# Patient Record
Sex: Female | Born: 1955
Health system: Southern US, Community
[De-identification: ages and names within clinical notes are randomized; demographics above are authoritative.]

## PROBLEM LIST (undated history)

## (undated) DIAGNOSIS — M159 Polyosteoarthritis, unspecified: Secondary | ICD-10-CM

## (undated) DIAGNOSIS — M19019 Primary osteoarthritis, unspecified shoulder: Secondary | ICD-10-CM

## (undated) DIAGNOSIS — E119 Type 2 diabetes mellitus without complications: Secondary | ICD-10-CM

## (undated) DIAGNOSIS — S92911A Unspecified fracture of right toe(s), initial encounter for closed fracture: Secondary | ICD-10-CM

## (undated) DIAGNOSIS — I10 Essential (primary) hypertension: Secondary | ICD-10-CM

## (undated) DIAGNOSIS — G894 Chronic pain syndrome: Secondary | ICD-10-CM

## (undated) DIAGNOSIS — Z8781 Personal history of (healed) traumatic fracture: Secondary | ICD-10-CM

## (undated) DIAGNOSIS — T4145XA Adverse effect of unspecified anesthetic, initial encounter: Secondary | ICD-10-CM

## (undated) DIAGNOSIS — E890 Postprocedural hypothyroidism: Secondary | ICD-10-CM

## (undated) DIAGNOSIS — I82439 Acute embolism and thrombosis of unspecified popliteal vein: Secondary | ICD-10-CM

## (undated) DIAGNOSIS — W19XXXA Unspecified fall, initial encounter: Secondary | ICD-10-CM

## (undated) DIAGNOSIS — C4442 Squamous cell carcinoma of skin of scalp and neck: Secondary | ICD-10-CM

## (undated) DIAGNOSIS — E038 Other specified hypothyroidism: Secondary | ICD-10-CM

## (undated) DIAGNOSIS — R42 Dizziness and giddiness: Secondary | ICD-10-CM

## (undated) HISTORY — DX: Chronic pain syndrome: G89.4

## (undated) HISTORY — DX: Type 2 diabetes mellitus without complications: E11.9

## (undated) HISTORY — DX: Squamous cell carcinoma of skin of scalp and neck: C44.42

## (undated) HISTORY — DX: Polyosteoarthritis, unspecified: M15.9

## (undated) HISTORY — DX: Dizziness and giddiness: R42

## (undated) HISTORY — DX: Unspecified fracture of right toe(s), initial encounter for closed fracture: S92.911A

## (undated) HISTORY — DX: Postprocedural hypothyroidism: E89.0

## (undated) HISTORY — PX: BACK SURGERY: SHX140

## (undated) HISTORY — DX: Primary osteoarthritis, unspecified shoulder: M19.019

## (undated) HISTORY — DX: Other specified hypothyroidism: E03.8

## (undated) HISTORY — PX: KNEE SURGERY: SHX244

---
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
## 1988-08-12 HISTORY — PX: CHOLECYSTECTOMY: SHX55

## 1988-08-12 HISTORY — PX: TOTAL ABDOMINAL HYSTERECTOMY W/ BILATERAL SALPINGOOPHORECTOMY: SHX83

## 2000-02-07 ENCOUNTER — Encounter: Admission: RE | Admit: 2000-02-07 | Discharge: 2000-02-07 | Payer: Self-pay | Admitting: Family Medicine

## 2000-02-07 ENCOUNTER — Encounter: Payer: Self-pay | Admitting: Family Medicine

## 2000-05-15 ENCOUNTER — Ambulatory Visit (HOSPITAL_COMMUNITY): Admission: RE | Admit: 2000-05-15 | Discharge: 2000-05-15 | Payer: Self-pay | Admitting: Gastroenterology

## 2000-06-18 ENCOUNTER — Ambulatory Visit (HOSPITAL_COMMUNITY): Admission: RE | Admit: 2000-06-18 | Discharge: 2000-06-18 | Payer: Self-pay | Admitting: Gastroenterology

## 2000-08-19 ENCOUNTER — Encounter (HOSPITAL_COMMUNITY): Admission: RE | Admit: 2000-08-19 | Discharge: 2000-11-17 | Payer: Self-pay | Admitting: Gastroenterology

## 2000-08-23 ENCOUNTER — Inpatient Hospital Stay (HOSPITAL_COMMUNITY): Admission: AD | Admit: 2000-08-23 | Discharge: 2000-08-23 | Payer: Self-pay | Admitting: Obstetrics

## 2000-08-25 ENCOUNTER — Encounter (INDEPENDENT_AMBULATORY_CARE_PROVIDER_SITE_OTHER): Payer: Self-pay | Admitting: *Deleted

## 2000-08-25 ENCOUNTER — Inpatient Hospital Stay (HOSPITAL_COMMUNITY): Admission: AD | Admit: 2000-08-25 | Discharge: 2000-08-25 | Payer: Self-pay | Admitting: Obstetrics and Gynecology

## 2000-08-25 ENCOUNTER — Ambulatory Visit (HOSPITAL_COMMUNITY): Admission: RE | Admit: 2000-08-25 | Discharge: 2000-08-25 | Payer: Self-pay | Admitting: Gastroenterology

## 2000-08-27 ENCOUNTER — Ambulatory Visit (HOSPITAL_COMMUNITY): Admission: RE | Admit: 2000-08-27 | Discharge: 2000-08-27 | Payer: Self-pay | Admitting: Obstetrics and Gynecology

## 2000-08-27 ENCOUNTER — Encounter (INDEPENDENT_AMBULATORY_CARE_PROVIDER_SITE_OTHER): Payer: Self-pay

## 2000-08-27 ENCOUNTER — Other Ambulatory Visit: Admission: RE | Admit: 2000-08-27 | Discharge: 2000-08-27 | Payer: Self-pay | Admitting: Obstetrics and Gynecology

## 2000-08-27 ENCOUNTER — Encounter: Payer: Self-pay | Admitting: Obstetrics and Gynecology

## 2000-09-11 ENCOUNTER — Inpatient Hospital Stay (HOSPITAL_COMMUNITY): Admission: RE | Admit: 2000-09-11 | Discharge: 2000-09-15 | Payer: Self-pay | Admitting: Obstetrics and Gynecology

## 2000-09-11 ENCOUNTER — Encounter (INDEPENDENT_AMBULATORY_CARE_PROVIDER_SITE_OTHER): Payer: Self-pay | Admitting: Specialist

## 2002-03-03 ENCOUNTER — Encounter: Payer: Self-pay | Admitting: Family Medicine

## 2002-03-03 ENCOUNTER — Encounter: Admission: RE | Admit: 2002-03-03 | Discharge: 2002-03-03 | Payer: Self-pay | Admitting: Family Medicine

## 2002-03-11 ENCOUNTER — Encounter: Payer: Self-pay | Admitting: Surgery

## 2002-03-18 ENCOUNTER — Encounter (INDEPENDENT_AMBULATORY_CARE_PROVIDER_SITE_OTHER): Payer: Self-pay | Admitting: Specialist

## 2002-03-18 ENCOUNTER — Encounter: Payer: Self-pay | Admitting: Surgery

## 2002-03-19 ENCOUNTER — Inpatient Hospital Stay (HOSPITAL_COMMUNITY): Admission: RE | Admit: 2002-03-19 | Discharge: 2002-03-20 | Payer: Self-pay | Admitting: Surgery

## 2002-11-19 ENCOUNTER — Encounter: Payer: Self-pay | Admitting: Family Medicine

## 2002-11-19 ENCOUNTER — Ambulatory Visit (HOSPITAL_COMMUNITY): Admission: RE | Admit: 2002-11-19 | Discharge: 2002-11-19 | Payer: Self-pay | Admitting: Family Medicine

## 2003-05-31 ENCOUNTER — Encounter: Payer: Self-pay | Admitting: Obstetrics and Gynecology

## 2003-05-31 ENCOUNTER — Ambulatory Visit (HOSPITAL_COMMUNITY): Admission: RE | Admit: 2003-05-31 | Discharge: 2003-05-31 | Payer: Self-pay | Admitting: Obstetrics and Gynecology

## 2004-05-01 ENCOUNTER — Encounter
Admission: RE | Admit: 2004-05-01 | Discharge: 2004-05-01 | Payer: Self-pay | Admitting: Physical Medicine and Rehabilitation

## 2004-08-04 ENCOUNTER — Emergency Department (HOSPITAL_COMMUNITY): Admission: EM | Admit: 2004-08-04 | Discharge: 2004-08-04 | Payer: Self-pay | Admitting: Family Medicine

## 2004-10-07 ENCOUNTER — Emergency Department (HOSPITAL_COMMUNITY): Admission: EM | Admit: 2004-10-07 | Discharge: 2004-10-07 | Payer: Self-pay | Admitting: Family Medicine

## 2004-11-05 ENCOUNTER — Emergency Department (HOSPITAL_COMMUNITY): Admission: EM | Admit: 2004-11-05 | Discharge: 2004-11-05 | Payer: Self-pay | Admitting: Family Medicine

## 2005-02-04 ENCOUNTER — Emergency Department (HOSPITAL_COMMUNITY): Admission: EM | Admit: 2005-02-04 | Discharge: 2005-02-04 | Payer: Self-pay | Admitting: Emergency Medicine

## 2005-07-23 ENCOUNTER — Emergency Department (HOSPITAL_COMMUNITY): Admission: AD | Admit: 2005-07-23 | Discharge: 2005-07-23 | Payer: Self-pay | Admitting: Emergency Medicine

## 2005-12-01 ENCOUNTER — Encounter: Admission: RE | Admit: 2005-12-01 | Discharge: 2005-12-01 | Payer: Self-pay | Admitting: Family Medicine

## 2005-12-26 ENCOUNTER — Encounter (INDEPENDENT_AMBULATORY_CARE_PROVIDER_SITE_OTHER): Payer: Self-pay | Admitting: *Deleted

## 2005-12-26 ENCOUNTER — Ambulatory Visit (HOSPITAL_COMMUNITY): Admission: RE | Admit: 2005-12-26 | Discharge: 2005-12-26 | Payer: Self-pay | Admitting: Neurosurgery

## 2008-03-18 ENCOUNTER — Emergency Department (HOSPITAL_BASED_OUTPATIENT_CLINIC_OR_DEPARTMENT_OTHER): Admission: EM | Admit: 2008-03-18 | Discharge: 2008-03-18 | Payer: Self-pay | Admitting: Emergency Medicine

## 2008-06-21 ENCOUNTER — Emergency Department (HOSPITAL_BASED_OUTPATIENT_CLINIC_OR_DEPARTMENT_OTHER): Admission: EM | Admit: 2008-06-21 | Discharge: 2008-06-21 | Payer: Self-pay | Admitting: Emergency Medicine

## 2008-08-12 HISTORY — PX: COLONOSCOPY: SHX174

## 2009-01-18 ENCOUNTER — Ambulatory Visit: Payer: Self-pay | Admitting: Diagnostic Radiology

## 2009-01-18 ENCOUNTER — Emergency Department (HOSPITAL_BASED_OUTPATIENT_CLINIC_OR_DEPARTMENT_OTHER): Admission: EM | Admit: 2009-01-18 | Discharge: 2009-01-18 | Payer: Self-pay | Admitting: Emergency Medicine

## 2009-04-21 ENCOUNTER — Emergency Department (HOSPITAL_BASED_OUTPATIENT_CLINIC_OR_DEPARTMENT_OTHER): Admission: EM | Admit: 2009-04-21 | Discharge: 2009-04-21 | Payer: Self-pay | Admitting: Emergency Medicine

## 2009-08-10 ENCOUNTER — Ambulatory Visit: Payer: Self-pay | Admitting: Diagnostic Radiology

## 2009-08-10 ENCOUNTER — Emergency Department (HOSPITAL_BASED_OUTPATIENT_CLINIC_OR_DEPARTMENT_OTHER): Admission: EM | Admit: 2009-08-10 | Discharge: 2009-08-10 | Payer: Self-pay | Admitting: Emergency Medicine

## 2010-04-23 ENCOUNTER — Emergency Department (HOSPITAL_BASED_OUTPATIENT_CLINIC_OR_DEPARTMENT_OTHER): Admission: EM | Admit: 2010-04-23 | Discharge: 2010-04-23 | Payer: Self-pay | Admitting: Emergency Medicine

## 2010-04-23 ENCOUNTER — Ambulatory Visit: Payer: Self-pay | Admitting: Diagnostic Radiology

## 2010-05-23 ENCOUNTER — Emergency Department (HOSPITAL_BASED_OUTPATIENT_CLINIC_OR_DEPARTMENT_OTHER): Admission: EM | Admit: 2010-05-23 | Discharge: 2010-05-23 | Payer: Self-pay | Admitting: Emergency Medicine

## 2010-05-23 ENCOUNTER — Ambulatory Visit: Payer: Self-pay | Admitting: Diagnostic Radiology

## 2010-09-02 ENCOUNTER — Encounter: Payer: Self-pay | Admitting: Family Medicine

## 2010-10-25 LAB — DIFFERENTIAL
Basophils Absolute: 0.1 10*3/uL (ref 0.0–0.1)
Basophils Relative: 1 % (ref 0–1)
Eosinophils Absolute: 0.1 10*3/uL (ref 0.0–0.7)
Eosinophils Relative: 2 % (ref 0–5)
Lymphocytes Relative: 23 % (ref 12–46)
Lymphs Abs: 2.1 10*3/uL (ref 0.7–4.0)
Monocytes Absolute: 0.7 10*3/uL (ref 0.1–1.0)
Monocytes Relative: 8 % (ref 3–12)
Neutro Abs: 6.1 10*3/uL (ref 1.7–7.7)
Neutrophils Relative %: 66 % (ref 43–77)

## 2010-10-25 LAB — BASIC METABOLIC PANEL
BUN: 8 mg/dL (ref 6–23)
CO2: 29 mEq/L (ref 19–32)
Calcium: 9.6 mg/dL (ref 8.4–10.5)
Chloride: 105 mEq/L (ref 96–112)
Creatinine, Ser: 0.8 mg/dL (ref 0.4–1.2)
GFR calc Af Amer: >60 mL/min (ref >60)
GFR calc non Af Amer: >60 mL/min (ref >60)
Glucose, Bld: 138 mg/dL — ABNORMAL HIGH (ref 70–99)
Potassium: 3.3 mEq/L — ABNORMAL LOW (ref 3.5–5.1)
Sodium: 141 mEq/L (ref 135–145)

## 2010-10-25 LAB — CBC
HCT: 40.5 % (ref 36.0–46.0)
Hemoglobin: 14.4 g/dL (ref 12.0–15.0)
MCH: 31.1 pg (ref 26.0–34.0)
MCHC: 35.5 g/dL (ref 30.0–36.0)
MCV: 87.5 fL (ref 78.0–100.0)
Platelets: 260 10*3/uL (ref 150–400)
RBC: 4.63 MIL/uL (ref 3.87–5.11)
RDW: 12.7 % (ref 11.5–15.5)
WBC: 9.1 10*3/uL (ref 4.0–10.5)

## 2010-10-25 LAB — POCT CARDIAC MARKERS
CKMB, poc: 1.4 ng/mL (ref 1.0–8.0)
Myoglobin, poc: 71.1 ng/mL (ref 12–200)
Troponin i, poc: <0.05 ng/mL (ref 0.00–0.09)

## 2010-10-25 LAB — D-DIMER, QUANTITATIVE: D-Dimer, Quant: 0.25 ug/mL-FEU (ref 0.00–0.48)

## 2010-11-12 LAB — DIFFERENTIAL
Basophils Absolute: 0.2 10*3/uL — ABNORMAL HIGH (ref 0.0–0.1)
Basophils Relative: 2 % — ABNORMAL HIGH (ref 0–1)
Eosinophils Absolute: 0.2 10*3/uL (ref 0.0–0.7)
Eosinophils Relative: 3 % (ref 0–5)
Lymphocytes Relative: 29 % (ref 12–46)
Lymphs Abs: 2.1 10*3/uL (ref 0.7–4.0)
Monocytes Absolute: 0.6 10*3/uL (ref 0.1–1.0)
Monocytes Relative: 9 % (ref 3–12)
Neutro Abs: 4.2 10*3/uL (ref 1.7–7.7)
Neutrophils Relative %: 58 % (ref 43–77)

## 2010-11-12 LAB — CBC
HCT: 43.3 % (ref 36.0–46.0)
Hemoglobin: 14.8 g/dL (ref 12.0–15.0)
MCHC: 34.2 g/dL (ref 30.0–36.0)
MCV: 90.8 fL (ref 78.0–100.0)
Platelets: 277 10*3/uL (ref 150–400)
RBC: 4.77 MIL/uL (ref 3.87–5.11)
RDW: 12.4 % (ref 11.5–15.5)
WBC: 7.3 10*3/uL (ref 4.0–10.5)

## 2010-11-12 LAB — POCT CARDIAC MARKERS
CKMB, poc: 1.7 ng/mL (ref 1.0–8.0)
CKMB, poc: 6.4 ng/mL (ref 1.0–8.0)
Myoglobin, poc: 75.4 ng/mL (ref 12–200)
Myoglobin, poc: 96.1 ng/mL (ref 12–200)
Troponin i, poc: <0.05 ng/mL (ref 0.00–0.09)
Troponin i, poc: <0.05 ng/mL (ref 0.00–0.09)

## 2010-11-12 LAB — BASIC METABOLIC PANEL
BUN: 7 mg/dL (ref 6–23)
CO2: 28 mEq/L (ref 19–32)
Calcium: 9.3 mg/dL (ref 8.4–10.5)
Chloride: 104 mEq/L (ref 96–112)
Creatinine, Ser: 0.8 mg/dL (ref 0.4–1.2)
GFR calc Af Amer: >60 mL/min (ref >60)
GFR calc non Af Amer: >60 mL/min (ref >60)
Glucose, Bld: 149 mg/dL — ABNORMAL HIGH (ref 70–99)
Potassium: 3.5 mEq/L (ref 3.5–5.1)
Sodium: 144 mEq/L (ref 135–145)

## 2010-11-12 LAB — D-DIMER, QUANTITATIVE: D-Dimer, Quant: <0.22 ug/mL-FEU (ref 0.00–0.48)

## 2010-12-28 NOTE — Procedures (Signed)
[facility]  Patient:    Claudia [name], Claudia [name]                       MRN: [id number] Proc. Date: 08/25/00 Adm. Date:  [id number] Attending:  Charna Elizabeth CC:         Teena Irani. Arlyce Dice, M.D., [facility]   Procedure Report  DATE OF BIRTH:  1956/02/27  PROCEDURE PERFORMED:  Colonoscopy.  ENDOSCOPIST:  Anselmo Rod, M.D.  INSTRUMENTS USED:  Olympus video colonoscope.  INDICATIONS:  Severe anemia with hemoglobin of 6.4 g/dl with microcytosis evident on CBC and guaiac positive stools in a 55 year old white female.  The patient tells me this weekend she has had heavy vaginal bleeding as well with passage of clots.  Rule out colonic polyps, masses, hemorrhoids, etc.  PREPROCEDURE PREPARATION:  Informed consent was procured from the patient. The patient was fasted for eight hours prior to the procedure.  She also received two units of packed red blood cells prior to the procedure.  PREPROCEDURE PHYSICAL EXAMINATION:  VITAL SIGNS:  The patient had stable vital signs.  NECK:  Supple.  CHEST:  Clear to auscultation, S1, S2 regular.  ABDOMEN:  Soft with normal abdominal bowel sounds.  DESCRIPTION OF PROCEDURE:  The patient was placed in the left lateral decubitus position and sedated with 50 mg of Demerol and 5 mg of Versed intravenously.  Once the patient was adequately sedated and maintained on low-flow oxygen and continuous cardiac monitoring, the Olympus video colonoscope was advanced from the rectum to the cecum without difficulty. There was some residual stool in the colon, especially the transverse and right colon.  There were scattered diverticula throughout the colon, more in the proximal descending and transverse colon.  No masses, polyps, erosions, or ulcerations were seen.  The patient had small, nonbleeding internal hemorrhoids on retroflexion in the rectum and tolerated the procedure well without  complications.  IMPRESSION: 1. Scattered diverticular disease. 2. Small, nonbleeding internal hemorrhoids. 3. No masses or polyps seen. 4. Procedure completed to the cecum.  The ileocecal valve and appendiceal    orifice clearly visualized and photographed.  RECOMMENDATIONS: 1. Increase fluids and fiber in her diet. 2. Check CBC today. 3. Blood transfusions as needed. 4. Follow up with Dr. Ambrose Mantle (gynecologist) later this week for further    evaluation of her vaginal bleeding. DD:  08/25/00 TD:  08/25/00 Job: [id number]

## 2010-12-28 NOTE — H&P (Signed)
[facility]  Patient:    Claudia [name], Claudia [name]                       MRN: [id number] Adm. Date:  [id number] Attending:  Charna Elizabeth                         History and Physical  HISTORY OF PRESENT ILLNESS:  This is a 55 year old, white, divorced female, para 2-0-1-2, who is admitted to the hospital for abdominal hysterectomy and bilateral salpingo-oophorectomy because of severe menorrhagia leading to severe anemia and leiomyoma to uteri.  Last menstrual period started on January 1 and continued until January 15 unabated.  When I saw her on 04-Feb-2024she had passed large golf ball size clots, had used 12 pads per day and had received four units of blood after her hemoglobin was found to be 6 by Dr. Elsie Amis, a gastrointestinal specialist.  After I saw her, I performed a Pap smear which was benign and an endometrial biopsy was benign.  A ultrasound showed a 5.8 x 5.7 cm area in the central uterus that could be a fibroid and a 5.6 x 2.9 x 3 cm left adnexal cyst.  The patient gave a history that her periods had been regular, lasting seven days every 28 days until the period began in January of 2002.  Over the previous 12 months, she had lost 69 pounds from depression.  She had also had an evaluation because of dysphagia, that included an upper endoscopy which was negative, except for what appeared to be fungal esophagitis.  HIV was negative.  The patient was treated with Provera and her bleeding has pretty much ceased.  ALLERGIES:  No known drug allergies.  MEDICATIONS:  No medications at present.  PAST SURGICAL HISTORY:  Tubal ligation in 1986, back surgery in 2000.  She had a TENS unit implanted in her side for pain in 1990.  PAST MEDICAL HISTORY:  Illnesses:  She has had the usual childhood diseases as stated in the history of present illness.  She has been evaluated for dysphagia with a stricture noted on a barium swallow.  The esophagus was dilated.  At  the time of wisdom teeth extraction in 1990, the patient apparently developed a hematoma in her left arm that she states caused the left arm to swell up as big as a watermelon.  She also developed a clot in the venous system in her left arm and was kept on Coumadin for three to six months.  FAMILY HISTORY:  Mother and father have diabetes.  Children are 21 and 17. Her husband left her in January of 2001.  Alcohol, tobacco and drugs, none. The patient works for [employer].  PHYSICAL EXAMINATION:  GENERAL:  Physical examination reveals a well-developed, somewhat obese, white female in no acute distress.  VITAL SIGNS:  Blood pressure is 120/70, pulse is 80, weight is 201-1/2 pounds but the patient did weigh 270 a year ago.  HEENT:  Revealed that the patient is somewhat alopecic.  She does have an upper dental plate and many absent lower teeth from the bicuspids out to the molars.  She does have a partial plate but does not have it in now.  NECK:  Supple without thyromegaly.  BREASTS:  Soft without masses lying down or sitting up.  HEART:  Normal size and sounds.  No murmurs.  LUNGS:  Clear to P&A.  ABDOMEN:  Soft and nontender.  There is no masses palpable.  The subcutaneous tissue is quite loose.  No masses are felt.  Liver, spleen and kidney are not felt.  PELVIC:  Vulva and vagina are clear.  BUS negative.  Cervix clean.  Uterus anterior, two to three times normal size, quite firm.  The adnexa are clear. The patient has had a colonoscopy recently.  ADMITTING IMPRESSION:  Leiomyoma uteri, history of severe menometrorrhagia with severe anemia to 6 grams.  The patient was given four units of blood products by Dr. Elsie Amis, a gastrointestinal specialist.  PLAN:  She is admitted now for abdominal hysterectomy.  She wants the tubes and ovaries removed.  The patient has been informed that I think that she is at increased risk for a wound infection because of her very  loose skin.  I have also told her about the risks of heart attack, stroke, pulmonary embolus, wound disruption, hemorrhage with a need for reoperation and/or transfusion, fistula formation, nerve injury, intestinal obstruction.  She understands and agrees to proceed.  The patient is also counseled about the unknown effect on her sex drive. DD:  09/10/00 TD:  09/11/00 Job: [id number]

## 2010-12-28 NOTE — Discharge Summary (Signed)
[facility]  Patient:    Claudia [name], Claudia [name]                       MRN: [id number] Adm. Date:  [id number] Disc. Date: [id number] Attending:  Malon Kindle                           Discharge Summary  ADDENDUM:  The patient was originally discharged on September 14, 2000, but because of nausea and vomiting, she elected to stay an extra day.  The nausea and vomiting has completely resolved and she is considered a candidate for discharge on September 15, 2000.  The abdomen is soft and nontender.  The staples have been removed and strips applied on the morning of discharge.  FINAL DIAGNOSES: 1. Large leiomyomata uteri. 2. History of severe menorrhagia with severe anemia creating a need for four    blood transfusions by her gastroenterologist.  FINAL CONDITION:  Improved.  DISCHARGE INSTRUCTIONS:  No vaginal entrance.  No heavy lifting or strenuous activity.  Report any fever greater than 100.4 degrees.  Report any unusual problems.  The patient was given a prescription of Dilaudid by Alvino Chapel, M.D.  That was not available at the pharmacy on September 14, 2000. If it is not available today, the patient can call my office for Vicodin, which is she also tolerating.  The patient is to return in 10-14 days. DD:  09/15/00 TD:  09/16/00 Job: [id number]

## 2010-12-28 NOTE — Op Note (Signed)
[facility]  Patient:    Claudia [name], Claudia [name]                       MRN: [id number] Proc. Date: 09/11/00 Adm. Date:  [id number] Attending:  Malon Kindle                           Operative Report  PREOPERATIVE DIAGNOSIS:  Fibroids, left ovarian cyst, menorrhagia, anemia.  POSTOPERATIVE DIAGNOSIS:  Fibroids, left ovarian cyst, menorrhagia, anemia.  OPERATION PERFORMED:  Abdominal hysterectomy, bilateral salpingo-oophorectomy.  SURGEON:  Malachi Pro. Ambrose Mantle, M.D.  ASSISTANT:  Alvino Chapel, M.D.  ANESTHESIA:  General.  DESCRIPTION OF PROCEDURE:  The patient was brought to the operating room and placed under satisfactory general anesthesia.  She was placed in frog leg position.  The abdomen was prepped with Betadine solution.  The vulva, vagina and urethra were prepped.  The Foley catheter was inserted to straight drain. The patient was then placed supine and the abdomen was draped as a sterile field.  I elected not to use electrical current because the patient had an implantable TENS unit in the left chest wall and I was not certain this could not conduct electricity.  The patients abdominal wall was very loose, probably secondary to her prior severe obesity and a 69-1/2 pound weight loss in the last 12 months.  A transverse incision was made and carried in layers through the skin and subcutaneous tissue and fascia.  The fascia was then separated from the rectus muscles superiorly and inferiorly.  The rectus muscles were split in the midline.  The peritoneum was opened vertically. Subcutaneous bleeders were ligated with 2-0 Vicryl.  The upper abdomen was explored.  It was a long ways up to her gallbladder but I thought I did feel the gallbladder without stones.  The liver felt smooth.  Both kidneys felt normal.  I did not palpate any abdominal abnormalities.  The omentum felt smooth.  Packs and retractors were used to prepare the operative  field.  There was a dense omental adhesion to the anterior peritoneum.  This was divided between clamps and ligated with 2-0 Vicryl.  This was probably secondary to her prior minilap tubal ligation.  Exploration of the pelvis revealed the posterior cul-de-sac to be normal.  The uterus was probably three times normal size with about a 5 cm pedunculated myoma from the right side of the uterus. The uterus itself was approximately three times normal size but was not irregular in contour but probably did contain intramural leiomyomas.  The anterior cul-de-sac was relatively normal.  There may have been a slight amount of scar tissue over the bladder.  The right tube and ovary appeared normal except for prior tubal ligation.  The left tube and ovary appeared as the right except the left ovary had about a 5 cm left ovarian cyst that was seen to be simple in appearance and the patient showed evidence of a prior tubal ligation.  After the field was prepared for surgery, the upper pedicles were clamped across.  The round ligaments were divided bilaterally and a bladder flap was developed.  The infundibulopelvic ligaments were divided and doubly suture ligated.  The parametrial and paracervical tissues were clamped, cut and suture ligated as were the uterosacral ligaments.  The uterosacral ligaments were held, the vaginal angles were entered.  The uterus and tubes and ovaries were removed by transecting the upper  vagina.  Vaginal angle sutures were placed and then the central portion of the vaginal cuff was closed with interrupted figure-of-eight sutures of 0 Vicryl.  Hemostasis appeared adequate.  Liberal irrigation confirmed hemostasis.  The uterosacral ligaments were sutured together in the midline with two sutures of 0 Vicryl. Reperitonealization was done with a running suture of 0 Vicryl after methylene blue stained fluid was used to fill the bladder and no leakage was demonstrated and there was  no evidence of any sutures close to the bladder. The right ureter was easily visible through the peritoneum.  I could not visualize the left ureter, so I attempted to identify it retroperitoneally; however, I still could not absolutely see it but I could feel it very clearly and then could see it banjo string underneath my fingers under direct visualization.  It had a normal course, contour and caliber.  All packs and retractors were removed.  The abdominal wall was then closed in layers using running suture of 0 Vicryl on the peritoneum, interrupted 0 Vicryl in the rectus muscle.  Two running sutures of 0 Vicryl on the fascia, a running 3-0 Vicryl on the subcutaneous tissue and staples on the skin.  The patient seemed to tolerate the procedure well.  Blood loss was thought to be about 200 cc. The sponge and needle counts were correct.  She was returned to recovery in satisfactory condition. DD:  09/11/00 TD:  09/11/00 Job: [id number]

## 2010-12-28 NOTE — Procedures (Signed)
[place]  Patient:    Claudia [name], Claudia [name]                       MRN: [id number] Proc. Date: 06/18/00 Adm. Date:  [id number] Attending:  Charna Elizabeth CC:         Teena Irani. Arlyce Dice, M.D., [facility]   Procedure Report  DATE OF BIRTH:  June 11, 1956  PROCEDURE PERFORMED:  Esophagogastroduodenoscopy with Savary dilation of cervical stricture.  ENDOSCOPIST:  Anselmo Rod, M.D.  INSTRUMENT USED:  Olympus video panendoscope.  INDICATIONS FOR PROCEDURE:  A 55 year old white female with a history of a stricture in the cervical esophagus and Savary dilation planned.  PREPROCEDURE PREPARATION:  Informed consent was procured from the patient. The patient was fasted for eight hours prior to the procedure.  PREPROCEDURE PHYSICAL:  VITAL SIGNS:  Stable.  NECK:  Supple.  CHEST:  Clear to auscultation, S1 and S2 regular.  ABDOMEN:  Soft with normal abdominal bowel sounds.  DESCRIPTION OF PROCEDURE:  The patient was placed in the left lateral decubitus position, and sedated with 50 mg of Demerol and 6 mg of Versed intravenously.  Once the patient was adequately sedated, maintained on low flow oxygen and continuous cardiac monitoring, the Olympus video panendoscope was advanced through the mouth piece over the tongue into the esophagus under direct vision.  There was slight hesitancy in the passage of the scope from the cervical esophagus into the middle esophagus.  There was a small ______ narrowing noticed in this area that was dilated with several Savary dilators measuring 12-French, 12.8-French, 14-French, 15-French, and 16-French.  This was done in the routine manner over a guidewire.  The patients gastric mucosa and proximal small bowel appeared normal.  Repeat endoscopy was done to check this area.  There was mild erythema at the site of dilatation.  The patient tolerated the procedure well without complication.  IMPRESSION:   Savary dilation performed of the cervical esophageal stricture without problems.  RECOMMENDATIONS: 1. A soft diet has been recommended for the next 2-3 days. 2. Carafate slurry was prescribed ________ q.i.d. for the 15 days. 3. Liberal fluid intake has been advocated with meals. 4. Outpatient follow up is advised in the next two weeks. DD:  06/19/00 TD:  06/20/00 Job: [id number]

## 2010-12-28 NOTE — Op Note (Signed)
Claudia [name], Claudia [name]                         ACCOUNT NO.:  [id number]   MEDICAL RECORD NO.:  [id number]                   PATIENT TYPE:  OBV   LOCATION:  0374                                 FACILITY:  [facility]   PHYSICIAN:  Currie Paris, M.D.           DATE OF BIRTH:  [birth year]   DATE OF PROCEDURE:  03/18/2002  DATE OF DISCHARGE:  03/20/2002                                 OPERATIVE REPORT   OFFICE MEDICAL RECORD NO:  [id number]   PREOPERATIVE DIAGNOSIS:  Chronic calculus cholecystitis .   POSTOPERATIVE DIAGNOSIS:  Chronic calculus cholecystitis.   OPERATION:  Laparoscopic cholecystectomy with operative cholangiogram.   SURGEON:  Currie Paris, M.D.   ASSISTANT:  Gabrielle Dare. Janee Morn, MD   ANESTHESIA:  General endotracheal.   CLINICAL HISTORY:  This patient is a 55 year old with biliary type symptoms  and a large stone seen on ultrasound.  She elected to proceed to  cholecystectomy.   DESCRIPTION OF PROCEDURE:  The patient was seen in the holding area and had  no further questions.  She was taken to the operating room and after  satisfactory general endotracheal anesthesia had been obtained, the abdomen  was prepped and draped.  Then 0.25% Marcaine was used for each incision.  Umbilical incision was made first and the fascia identified, opened and the  peritoneal cavity entered.  The Hasson was introduced and held in place with  a pursestring.  The abdomen was insufflated to 15.  Traditional trocars were  placed in the usual positions.   Because of the patient's large size we had a little difficulty seeing over  the omentum; so I went ahead and switched to a 30-degree scope, which gave  good visualization of the area of the triangle of Calot.  I was able to  dissect out and take some fatty tissue off the gallbladder in that area;  identified what appeared to be the cystic artery.  I found the cystic duct  and traced it down to what I thought it joined the common  duct and then back  up to where it joined the gallbladder.  I could see what appeared to be a  cystic artery behind that, and then what appeared to be the right hepatic  artery branching up and coming up alongside into the triangle of Calot.  Because of that I went ahead and at this point and clipped the cystic duct  at its junction with the gallbladder.  Put a clip on the first branch of the  cystic artery that we saw.  The cystic duct was then opened and a Cooke  catheter introduced; cholangiography done.  This showed the cystic duct to  fill, the common duct to fill up into the hepatic radicals, and well into  the duodenum.   This confirmed my anatomy and I went ahead and removed the catheter.  I  placed three clips on the stay side  of the cystic duct.  Additional clips  were placed on the small branch of the cystic artery, and it was clipped.  As we dissected further and freed the gallbladder a little bit more, I could  see other smaller branches coming off of what did appear to be the right  hepatic; these were clipped and divided.  Once we got a little past this  area then we were free and were able to move the gallbladder in the standard  fashion with the cautery.  It was somewhat intrahepatic.   Just prior to disconnecting, I went back and looked at the area of the  cystic duct; irrigated and made sure everything was dry -- it appeared to be  okay.  The gallbladder was disconnected and put in a bag.  It was brought  out the umbilical port.  We had to open it and use a ring forceps to break  up the single large stone in order to manipulate it through the umbilicus.  The umbilical incision was occluded for a moment while we reinsufflated.  We  did a final irrigation and hemostasis was checked.  Again, everything seemed  fine.  The lateral ports were removed and the pursestring tied down.  The  abdomen was deflated through the epigastric port.  The skin was closed with  4-0 Monocryl  subcuticular plus Steri-Strips.  The patient tolerated the  procedure well.  There were no operative complications and all counts were  correct.                                               Currie Paris, M.D.    CJS/MEDQ  D:  03/18/2002  T:  03/21/2002  Job:  [id number]

## 2010-12-28 NOTE — Discharge Summary (Signed)
[facility]  Patient:    Claudia [name], Claudia [name]                       MRN: [id number] Adm. Date:  [id number] Disc. Date: 09/14/00 Attending:  Malon Kindle                           Discharge Summary  DISCHARGE DIAGNOSES: 1. Fibroid uterus. 2. Menorrhagia. 3. Severe anemia requiring transfusion. 4. Left ovarian cyst. 5. Status post total abdominal hysterectomy and bilateral    salpingo-oophorectomy.  DISCHARGE MEDICATIONS: 1. Motrin 600 mg p.o. q.6h. 2. Dilaudid 1 to 2 mg p.o. q.4h. p.r.n.  DISCHARGE FOLLOWUP:  The patient is to follow up in my office in approximately two weeks for her incision check and six weeks for a full postoperative exam.  ADMISSION HISTORY:  The patient is a 55 year old, G3, P 2-0-1-2, who is admitted to the hospital on September 10, 2000, of a scheduled abdominal hysterectomy and bilateral salpingo-oophorectomy as a result of severe menorrhagia with severe anemia which developed from a fibroid uterus.  Prior to surgery she had a benign Pap smear and endometrial biopsy and an ultrasound which revealed fibroid uterus and a left adnexal cyst which was approximately 6 cm.  The patient otherwise had illnesses including a recent 70-pound weight loss secondary to depression.  She had a previous esophageal stricture as well as a history of clotting in her left arm which required Coumadin.  PAST SURGICAL HISTORY:  She had a tubal ligation in 1986, she had back surgery in 2000, and had a TENS unit placed in 1990.  ALLERGIES:  She had no known drug allergies.  CURRENT MEDICATIONS:  Had been taking Provera prior to surgery which had aided with stopping her bleeding.  HOSPITAL COURSE:  On admission the patient was stable, and blood count had significantly improved prior to surgery to approximate hemoglobin of 9.8.  The patient underwent a total abdominal hysterectomy and bilateral salpingo-oophorectomy on September 11, 2000, without  complications.  She was then admitted postoperatively for routine postoperative care.  On hospital day #1, she was noted to have a low-grade fever; however, had no signs or symptoms of infection.  This had essentially resolved by hospital day #2, and on postop day #3, the patient was afebrile with a T-MAX of 98.3.  Blood pressure and other vital signs were stable.  Her incision was clean, dry, and intact, and she was to have her staples removed prior to discharge.  On postop day #3, the patient began to experience some nausea and vomiting late in the night and early hours of that morning.  She was able to tolerate breakfast on postop day #3; however, continued to have some baseline nausea.  She was passing flatus and had a bowel movement; therefore, was assumed that her nausea and vomiting was probably a result of the p.o. pain medication she was taking, and these were changed to Motrin and p.o. Dilaudid.  Overall, the patient looked very well on postop day #3 in the morning, and therefore the plan was to change to her pain medications as previously stated to Dilaudid and Motrin.  She had no evidence of ileus or obstruction, and her abdomen was soft.  Should the patient tolerate her new pain medications and be able to tolerate a regular diet, she will be discharged to home the evening of September 14, 2000.  CONDITION UPON DISCHARGE:  Improved. DD:  09/14/00 TD:  09/15/00 Job: [id number]

## 2010-12-28 NOTE — Op Note (Signed)
NAME:  Claudia [name], Claudia [name]                ACCOUNT NO.:  [id number]   MEDICAL RECORD NO.:  [id number]          PATIENT TYPE:  AMB   LOCATION:  SDS                          FACILITY:  [facility]   PHYSICIAN:  Cristi Loron, M.D.DATE OF BIRTH:  08/12/1956   DATE OF PROCEDURE:  12/26/2005  DATE OF DISCHARGE:                                 OPERATIVE REPORT   BRIEF HISTORY:  The patient is a 55 year old white female who has suffered  from headaches.  She was seen by Santina Evans A. Orlin Hilding, M.D., a neurologist  and she thought that perhaps she had temporal arteritis.  She was sent for  my consultation for temporal artery biopsy.  I discussed the surgery with  the patient.  The patient weighed the risks, benefits and alternatives of  surgery and decided to proceed with a right temporal artery biopsy.   PREOPERATIVE DIAGNOSIS:  Headaches and possible temporal arteritis.   POSTOPERATIVE DIAGNOSIS:  Headaches and possible temporal arteritis.   PROCEDURE:  Right superficial temporal artery biopsy.   SURGEON:  Cristi Loron, M.D.   ASSISTANT:  None.   ANESTHESIA:  MAC.   ESTIMATED BLOOD LOSS:  Minimal.   SPECIMENS:  A frontal branch of the superficial temporal artery.   COMPLICATIONS:  None.   DESCRIPTION OF PROCEDURE:  The patient was brought to the operating room by  the Anesthesia team.  The patient remained in supine position.  Her head was  turned towards the left exposing right scalp.  I used the clippers to shave  the right temporal region and then this region was prepared with Betadine  scrub and Betadine solution and sterile drapes were applied.  I injected the  area to be incised with Marcaine with epinephrine solution.  I then used a  scalpel to make a linear incision just in front of the patient's tragus.  I  then used the Metzenbaum scissors to dissect deeper and the Weitlaner  retractor for exposure.  I then identified the superficial temporal artery  as well as a frontal  branch of the superficial temporal artery which was  leading towards the sore area in the right frontal region.  I then  isolated this frontal branch of the superficial temporal artery and then I  used a 2-0 silk tie to ligate both proximally and distally and then I  ligated this frontal branch of the superficial temporal artery.  I used the  Metzenbaum scissors and resected approximately a 1 inch segment of the  artery.  Then we sent this off to the pathologist for permanent section.  I  should mention that prior to ligating this artery, we used a micro Doppler  to confirm that this was actually indeed an artery and not a vein.  We then  obtained hemostasis using bipolar electrocautery and then I removed the  retractors and reapproximated the patient's galea with interrupted 2-0  Vicryl suture, skin with Steri-Strips and  benzoin.  The wound was then coated with bacitracin ointment.  Sterile  dressing was applied, the drapes were removed.  The patient was subsequently  transported to  the post anesthesia care unit in stable condition.  All  sponge, needle and instrument counts were correct at end of this case.      Cristi Loron, M.D.  Electronically Signed     JDJ/MEDQ  D:  12/26/2005  T:  12/27/2005  Job:  [id number]

## 2010-12-28 NOTE — Procedures (Signed)
[place]  Patient:    Claudia [name], Claudia [name]                       MRN: [id number] Proc. Date: 05/15/00 Adm. Date:  [id number] Attending:  Charna Elizabeth CC:         Teena Irani. Arlyce Dice, M.D., [facility]   Procedure Report  DATE OF BIRTH:  27-Oct-1955  PROCEDURE PERFORMED:  Esophagogastroduodenoscopy with esophageal brushings.  ENDOSCOPIST:  Anselmo Rod, M.D.  INSTRUMENT USED:  Olympus video panendoscope.  INDICATIONS FOR PROCEDURE:  Dysphagia for solids in a 55 year old white female, rule out stricture.  The patient has an abnormality in the cervical esophagus on a barium swallow.  PREPROCEDURE PREPARATION:  Informed consent was procured from the patient. The patient was fasted for eight hours prior to the procedure.  PREPROCEDURE PHYSICAL:  VITAL SIGNS:  Stable.  NECK:  Supple.  CHEST:  Clear to auscultation, S1 and S2 regular.  ABDOMEN:  Soft with normal abdominal bowel sounds.  DESCRIPTION OF PROCEDURE:  The patient was placed in the left lateral decubitus position, and sedated with 55 mg of Demerol and 5 mg of Versed intravenously.  Once the patient was adequately sedated, maintained on low flow oxygen and continuous cardiac monitoring, the Olympus video panendoscope was advanced through the mouth piece over the tongue into the esophagus under direct vision.  There was evidence of exudate in the cervical esophagus and in the mid esophageal area consistent with Candida.  Brushings were done from this area to confirm the biopsies by KOH preparation.  Examination of the gastric mucosa showed no evidence of erosions, ulcerations, masses, or polyps. A small hiatal hernia was seen on high retroflexion in the cardia.  The proximal small bowel appeared normal.  IMPRESSION: 1. Slight narrowing of the cervical esophagus with exudate, a patch exudate in    the esophagus consistent with Candidal esophagitis, brushings done  for    potassium hydroxide preparation. 2. Small hiatal hernia. 3. Normal gastric mucosa and proximal small bowel.  RECOMMENDATIONS: 1. The patient will be started on Diflucan 200 mg one p.o. q.d. for the next    10 days. 2. A soft diet has been advocated. 3. Outpatient follow up is advised in the next two weeks. DD:  05/15/00 TD:  05/16/00 Job: [id number]

## 2011-05-14 LAB — RAPID STREP SCREEN (MED CTR MEBANE ONLY): Streptococcus, Group A Screen (Direct): NEGATIVE

## 2012-04-27 ENCOUNTER — Emergency Department
Admission: EM | Admit: 2012-04-27 | Discharge: 2012-04-27 | Disposition: A | Discharge status: Home / Self Care | Payer: Self-pay | Source: Home / Self Care

## 2012-04-27 ENCOUNTER — Encounter: Payer: Self-pay | Admitting: *Deleted

## 2012-04-27 ENCOUNTER — Telehealth: Payer: Self-pay | Admitting: *Deleted

## 2012-04-27 ENCOUNTER — Ambulatory Visit (HOSPITAL_BASED_OUTPATIENT_CLINIC_OR_DEPARTMENT_OTHER)
Admission: RE | Admit: 2012-04-27 | Discharge: 2012-04-27 | Disposition: A | Discharge status: Home / Self Care | Payer: Self-pay | Source: Ambulatory Visit | Attending: Family Medicine | Admitting: Family Medicine

## 2012-04-27 ENCOUNTER — Emergency Department (INDEPENDENT_AMBULATORY_CARE_PROVIDER_SITE_OTHER): Payer: Self-pay

## 2012-04-27 ENCOUNTER — Other Ambulatory Visit: Payer: Self-pay | Admitting: Family Medicine

## 2012-04-27 DIAGNOSIS — M25569 Pain in unspecified knee: Secondary | ICD-10-CM

## 2012-04-27 DIAGNOSIS — M1710 Unilateral primary osteoarthritis, unspecified knee: Secondary | ICD-10-CM

## 2012-04-27 DIAGNOSIS — M179 Osteoarthritis of knee, unspecified: Secondary | ICD-10-CM

## 2012-04-27 DIAGNOSIS — M25561 Pain in right knee: Secondary | ICD-10-CM

## 2012-04-27 DIAGNOSIS — IMO0002 Reserved for concepts with insufficient information to code with codable children: Secondary | ICD-10-CM

## 2012-04-27 HISTORY — DX: Essential (primary) hypertension: I10

## 2012-04-27 MED ORDER — TRAMADOL HCL 50 MG PO TABS: 50.0000 mg | ORAL_TABLET | Freq: Four times a day (QID) | ORAL | Status: DC | PRN

## 2012-04-27 NOTE — ED Notes (Signed)
Pt c/o RT knee pain on and off x , post fall while holding her grandson. She states that it has been worse x 2wks. She took tylenol this AM.

## 2012-04-27 NOTE — ED Provider Notes (Addendum)
History     CSN: [id number]  Arrival date & time 04/27/12  1407   None     Chief Complaint  Patient presents with  . Knee Pain    right   HPI Pt presents today with chief complaint of R knee pain. Pt states that she fell on same knee approx 6 months ago while playing with her grandson. Pain initially self resolved. Pt states that she has had a recurrence of pain over the last 2 weeks. Pain is predominantly posterolateral R knee pain without radiation. Has been unable to bear weight on knee. Prior hx/o knee arthroscopy of affected knee in the past. No hx/o gout.   Past Medical History  Diagnosis Date  . Diabetes mellitus   . Hypertension     Past Surgical History  Procedure Date  . Tonsillectomy   . Abdominal hysterectomy   . Cholecystectomy   . Knee surgery     Family History  Problem Relation Age of Onset  . Diabetes Mother     History  Substance Use Topics  . Smoking status: Never Smoker   . Smokeless tobacco: Not on file  . Alcohol Use: No    OB History    Grav Para Term Preterm Abortions TAB SAB Ect Mult Living                  Review of Systems  All other systems reviewed and are negative.    Allergies  Review of patient's allergies indicates no known allergies.  Home Medications   Current Outpatient Rx  Name Route Sig Dispense Refill  . LISINOPRIL 10 MG PO TABS Oral Take 10 mg by mouth daily.    Marland Kitchen METFORMIN HCL 500 MG PO TABS Oral Take 500 mg by mouth 2 (two) times daily with a meal.      BP 155/82  Pulse 76  Temp 98.1 F (36.7 C) (Oral)  Resp 16  Ht 5' 7.5" (1.715 m)  Wt 220 lb (99.791 kg)  BMI 33.95 kg/m2  SpO2 100%  Physical Exam  Constitutional:       Obese    HENT:  Head: Normocephalic and atraumatic.  Eyes: Conjunctivae normal are normal. Pupils are equal, round, and reactive to light.  Neck: Normal range of motion. Neck supple.  Cardiovascular: Normal rate and regular rhythm.   Pulmonary/Chest: Effort normal and breath  sounds normal.  Abdominal: Soft. Bowel sounds are normal.  Musculoskeletal:       Knee: Normal to inspection with no erythema or effusion or obvious bony abnormalities.  + mild warmth over anterior knee.  + TTP over lateral and posterior R knee compartment.  + Pain with knee flexion and extension.  Ligaments with solid consistent endpoints including ACL, PCL, LCL, MCL. + pain with mcmurray's maneuver.  Patellar and quadriceps tendons unremarkable. Hamstring and quadriceps strength is normal.   Neurological: She is alert.  Skin: Skin is warm.    ED Course  Injection of joint Performed by: Doree Albee Authorized by: Doree Albee Consent: Verbal consent obtained. Risks and benefits: risks, benefits and alternatives were discussed Consent given by: patient Patient understanding: patient states understanding of the procedure being performed Patient identity confirmed: verbally with patient Local anesthesia used: yes Anesthesia method: ethyl chloride spray  Patient tolerance: Patient tolerated the procedure well with no immediate complications. Comments: 1cc of trimacinolone 40mg /cc and 4ccs of 2% lidocaine without epinephrine injected into R knee via medial approach.    (including critical care time)  Labs Reviewed - No data to display Dg Knee Complete 4 Views Right  04/27/2012  *RADIOLOGY REPORT*  Clinical Data: Injured right knee with pain and swelling  RIGHT KNEE - COMPLETE 4+ VIEW  Comparison: Right knee films of 04/23/2010  Findings: There has been some progression of degenerative joint disease involving the medial compartment and patellofemoral compartment with loss of joint space, sclerosis, and spurring.  No fracture is seen.  No effusion is noted.  IMPRESSION: Progression of degenerative joint disease involving the medial and patellofemoral compartments.   Original Report Authenticated By: Juline Patch, M.D.      1. Knee pain   2. Knee osteoarthritis       MDM    Noted degenerative changes on xray.  Pt given therapeutic steroid injection at bedside.  Knee brace placed.  Given posterior involvement of pain, will obtain knee u/s to rule out baker's cyst.  Discussed general red flags. Would consider PT if injection not beneficial.      The patient and/or caregiver has been counseled thoroughly with regard to treatment plan and/or medications prescribed including dosage, schedule, interactions, rationale for use, and possible side effects and they verbalize understanding. Diagnoses and expected course of recovery discussed and will return if not improved as expected or if the condition worsens. Patient and/or caregiver verbalized understanding.             Doree Albee, MD 04/27/12 1508  Doree Albee, MD 04/27/12 1510  Doree Albee, MD 04/27/12 [phone]

## 2012-07-11 ENCOUNTER — Encounter (HOSPITAL_COMMUNITY): Payer: Self-pay | Admitting: Emergency Medicine

## 2012-07-11 ENCOUNTER — Emergency Department (HOSPITAL_COMMUNITY)
Admission: EM | Admit: 2012-07-11 | Discharge: 2012-07-11 | Disposition: A | Discharge status: Home / Self Care | Payer: BC Managed Care – PPO | Source: Home / Self Care | Attending: Emergency Medicine | Admitting: Emergency Medicine

## 2012-07-11 DIAGNOSIS — L0291 Cutaneous abscess, unspecified: Secondary | ICD-10-CM

## 2012-07-11 DIAGNOSIS — L0390 Cellulitis, unspecified: Secondary | ICD-10-CM

## 2012-07-11 MED ORDER — SULFAMETHOXAZOLE-TRIMETHOPRIM 800-160 MG PO TABS: 1.0000 | ORAL_TABLET | Freq: Two times a day (BID) | ORAL | Status: DC

## 2012-07-11 MED ORDER — LORAZEPAM 1 MG PO TABS: ORAL_TABLET | ORAL | Status: DC

## 2012-07-11 NOTE — Discharge Instructions (Signed)
Abscess An abscess is an infected area that contains a collection of pus and debris. It can occur in almost any part of the body. An abscess is also known as a furuncle or boil. CAUSES   An abscess occurs when tissue gets infected. This can occur from blockage of oil or sweat glands, infection of hair follicles, or a minor injury to the skin. As the body tries to fight the infection, pus collects in the area and creates pressure under the skin. This pressure causes pain. People with weakened immune systems have difficulty fighting infections and get certain abscesses more often.   SYMPTOMS Usually an abscess develops on the skin and becomes a painful mass that is red, warm, and tender. If the abscess forms under the skin, you may feel a moveable soft area under the skin. Some abscesses break open (rupture) on their own, but most will continue to get worse without care. The infection can spread deeper into the body and eventually into the bloodstream, causing you to feel ill.   DIAGNOSIS   Your caregiver will take your medical history and perform a physical exam. A sample of fluid may also be taken from the abscess to determine what is causing your infection. TREATMENT   Your caregiver may prescribe antibiotic medicines to fight the infection. However, taking antibiotics alone usually does not cure an abscess. Your caregiver may need to make a small cut (incision) in the abscess to drain the pus. In some cases, gauze is packed into the abscess to reduce pain and to continue draining the area. HOME CARE INSTRUCTIONS    Only take over-the-counter or prescription medicines for pain, discomfort, or fever as directed by your caregiver.   If you were prescribed antibiotics, take them as directed. Finish them even if you start to feel better.   If gauze is used, follow your caregiver's directions for changing the gauze.   To avoid spreading the infection:   Keep your draining abscess covered with a  bandage.   Wash your hands well.   Do not share personal care items, towels, or whirlpools with others.   Avoid skin contact with others.   Keep your skin and clothes clean around the abscess.   Keep all follow-up appointments as directed by your caregiver.  SEEK MEDICAL CARE IF:    You have increased pain, swelling, redness, fluid drainage, or bleeding.   You have muscle aches, chills, or a general ill feeling.   You have a fever.  MAKE SURE YOU:    Understand these instructions.   Will watch your condition.   Will get help right away if you are not doing well or get worse.  Document Released: 05/08/2005 Document Revised: 01/28/2012 Document Reviewed: 10/11/2011 [employer] Patient Information 2013 [employer].    

## 2012-07-11 NOTE — ED Provider Notes (Signed)
History     CSN: [id number]  Arrival date & time 07/11/12  1636   First MD Initiated Contact with Patient 07/11/12 1748      Chief Complaint  Patient presents with  . Recurrent Skin Infections    boil on left breast x 1 wk    (Consider location/radiation/quality/duration/timing/severity/associated sxs/prior treatment) Patient is a 56 y.o. female presenting with abscess. The history is provided by the patient. No language interpreter was used.  Abscess  This is a new problem. The current episode started more than one week ago. The problem has been unchanged. The abscess is present on the trunk. The problem is moderate. The abscess is characterized by redness. The abscess first occurred at home. There were no sick contacts.  Pt complains of swollen area to left side of chest  Past Medical History  Diagnosis Date  . Diabetes mellitus   . Hypertension     Past Surgical History  Procedure Date  . Tonsillectomy   . Abdominal hysterectomy   . Cholecystectomy   . Knee surgery     Family History  Problem Relation Age of Onset  . Diabetes Mother     History  Substance Use Topics  . Smoking status: Never Smoker   . Smokeless tobacco: Not on file  . Alcohol Use: No    OB History    Grav Para Term Preterm Abortions TAB SAB Ect Mult Living                  Review of Systems  Skin: Positive for wound.  All other systems reviewed and are negative.    Allergies  Review of patient's allergies indicates no known allergies.  Home Medications   Current Outpatient Rx  Name  Route  Sig  Dispense  Refill  . LISINOPRIL 10 MG PO TABS   Oral   Take 10 mg by mouth daily.         Marland Kitchen METFORMIN HCL 500 MG PO TABS   Oral   Take 500 mg by mouth 2 (two) times daily with a meal.         . TRAMADOL HCL 50 MG PO TABS   Oral   Take 1 tablet (50 mg total) by mouth every 6 (six) hours as needed for pain.   30 tablet   3     BP 139/48  Pulse 89  Temp 98 F (36.7 C)  (Oral)  Resp 16  SpO2 100%  Physical Exam  Nursing note and vitals reviewed. Constitutional: She is oriented to person, place, and time. She appears well-developed and well-nourished.  HENT:  Head: Normocephalic.  Musculoskeletal: Normal range of motion.  Neurological: She is alert and oriented to person, place, and time. She has normal reflexes.  Skin: There is erythema.       2cm area of swelling,  (Pt has tens unit beside area)  Psychiatric: She has a normal mood and affect.    ED Course  INCISION AND DRAINAGE Date/Time: 07/11/2012 6:13 PM Performed by: Elson Areas Authorized by: Leslee Home C Consent: Verbal consent not obtained. Risks and benefits: risks, benefits and alternatives were discussed Consent given by: patient Required items: required blood products, implants, devices, and special equipment available Patient identity confirmed: verbally with patient Time out: Immediately prior to procedure a "time out" was called to verify the correct patient, procedure, equipment, support staff and site/side marked as required. Type: abscess Body area: trunk Anesthesia: local infiltration Local anesthetic: lidocaine 2%  without epinephrine Scalpel size: 11 Incision type: small x shape. Complexity: simple Drainage: purulent Wound treatment: wound left open Patient tolerance: Patient tolerated the procedure well with no immediate complications.   (including critical care time)  Labs Reviewed - No data to display No results found.   No diagnosis found.    MDM   Rx for bactrim.   Family reports pt's Mother died today,   Pt not sleeping.  Family request something to help pt sleep.   Pt given rx for ativan       Elson Areas, [place] 07/11/12 1815

## 2012-07-11 NOTE — ED Provider Notes (Signed)
Medical screening examination/treatment/procedure(s) were performed by non-physician practitioner and as supervising physician I was immediately available for consultation/collaboration.  Naketa Daddario, M.D.   Aric Jost C Kamaria Lucia, MD 07/11/12 2046 

## 2012-07-11 NOTE — ED Notes (Signed)
Pt c/o boil on left breast x 1 wk. Area is red and swollen. Pt states that she did pop it and drained it on Thursday with no relief in pain.   Pt also just recently lost mother this morning and is having a hard time emotionally and unable to relax.

## 2012-07-13 NOTE — ED Notes (Signed)
Culture report pending 

## 2012-07-15 LAB — CULTURE, ROUTINE-ABSCESS

## 2013-03-23 DIAGNOSIS — E669 Obesity, unspecified: Secondary | ICD-10-CM | POA: Insufficient documentation

## 2013-05-10 DIAGNOSIS — M25561 Pain in right knee: Secondary | ICD-10-CM | POA: Insufficient documentation

## 2013-06-17 ENCOUNTER — Encounter (HOSPITAL_COMMUNITY): Payer: Self-pay | Admitting: Pharmacy Technician

## 2013-06-23 NOTE — H&P (Signed)
  Claudia [name] is an 57 y.o. female.    Chief Complaint: left knee pain  HPI: Pt is a 57 y.o. female complaining of left knee pain for multiple years. Pain had continually increased since the beginning. X-rays in the clinic show end-stage arthritic changes of the left knee. Pt has tried various conservative treatments which have failed to alleviate their symptoms, including therapy and injections. Various options are discussed with the patient. Risks, benefits and expectations were discussed with the patient. Patient understand the risks, benefits and expectations and wishes to proceed with surgery.   PCP:  Provider Not In System  D/C Plans:  Home with HHPT  PMH: Past Medical History  Diagnosis Date  . Diabetes mellitus   . Hypertension     PSH: Past Surgical History  Procedure Laterality Date  . Tonsillectomy    . Abdominal hysterectomy    . Cholecystectomy    . Knee surgery      Social History:  reports that she has never smoked. She does not have any smokeless tobacco history on file. She reports that she does not drink alcohol or use illicit drugs.  Allergies:  No Known Allergies  Medications: No current facility-administered medications for this encounter.   Current Outpatient Prescriptions  Medication Sig Dispense Refill  . Dapagliflozin Propanediol (FARXIGA) 5 MG TABS Take 5 mg by mouth daily before breakfast.      . diclofenac sodium (VOLTAREN) 1 % GEL Apply 2 g topically 2 (two) times daily.      Marland Kitchen glipiZIDE (GLUCOTROL) 10 MG tablet Take 10 mg by mouth 2 (two) times daily before a meal.      . HYDROcodone-acetaminophen (NORCO/VICODIN) 5-325 MG per tablet Take 1 tablet by mouth every 4 (four) hours as needed for moderate pain.      Marland Kitchen lisinopril (PRINIVIL,ZESTRIL) 10 MG tablet Take 10 mg by mouth daily.      . metFORMIN (GLUCOPHAGE) 500 MG tablet Take 500 mg by mouth 2 (two) times daily with a meal.      . sitaGLIPtin (JANUVIA) 100 MG tablet Take 100 mg by mouth  daily.      . traMADol (ULTRAM) 50 MG tablet Take 50 mg by mouth every 6 (six) hours as needed for moderate pain.        No results found for this or any previous visit (from the past 48 hour(s)). No results found.  ROS: Pain with rom of the left lower extremity  Physical Exam: 128/82, 74, 12 Alert and oriented 57 y.o. female in no acute distress Cranial nerves 2-12 intact Cervical spine: full rom with no tenderness, nv intact distally Chest: active breath sounds bilaterally, no wheeze rhonchi or rales Heart: regular rate and rhythm, no murmur Abd: non tender non distended with active bowel sounds Hip is stable with rom  Left knee with moderate tenderness and crepitus with rom nv intact distally No rashes or edema Minimally antalgic gait  Assessment/Plan Assessment: left knee end stage osteoarthritis  Plan: Patient will undergo a left total knee arthroplasty by Dr. Ranell Patrick at [facility]. Risks benefits and expectations were discussed with the patient. Patient understand risks, benefits and expectations and wishes to proceed.

## 2013-06-23 NOTE — Pre-Procedure Instructions (Signed)
Claudia [name]  06/23/2013   Your procedure is scheduled on:  July 02, 2013  Report to [facility] (Entrance A) at 5:30 AM.  Call this number if you have problems the morning of surgery: [phone]   Remember:   Do not eat food or drink liquids after midnight.   Take these medicines the morning of surgery with A SIP OF WATER: pain pill as needed.  STOP diclofenac sodium (VOLTAREN) 1 % GEL 5 days prior to surgery   Do not wear jewelry, make-up or nail polish.  Do not wear lotions, powders, or perfumes. You may wear deodorant.  Do not shave 48 hours prior to surgery. Men may shave face and neck.  Do not bring valuables to the hospital.  [facility] is not responsible for any belongings or valuables.               Contacts, dentures or bridgework may not be worn into surgery.  Leave suitcase in the car. After surgery it may be brought to your room.  For patients admitted to the hospital, discharge time is determined by your  treatment team.               Patients discharged the day of surgery will not be allowed to drive home.  Name and phone number of your driver:   Special Instructions: Shower using CHG 2 nights before surgery and the night before surgery.  If you shower the day of surgery use CHG.  Use special wash - you have one bottle of CHG for all showers.  You should use approximately 1/3 of the bottle for each shower.   Please read over the following fact sheets that you were given: Pain Booklet, Coughing and Deep Breathing, Blood Transfusion Information and Surgical Site Infection Prevention

## 2013-06-24 ENCOUNTER — Encounter (HOSPITAL_COMMUNITY)
Admission: RE | Admit: 2013-06-24 | Discharge: 2013-06-24 | Disposition: A | Discharge status: Home / Self Care | Payer: BC Managed Care – PPO | Source: Ambulatory Visit | Attending: Orthopedic Surgery | Admitting: Orthopedic Surgery

## 2013-06-24 ENCOUNTER — Encounter (HOSPITAL_COMMUNITY): Payer: Self-pay

## 2013-06-24 DIAGNOSIS — Z01812 Encounter for preprocedural laboratory examination: Secondary | ICD-10-CM | POA: Insufficient documentation

## 2013-06-24 DIAGNOSIS — Z01818 Encounter for other preprocedural examination: Secondary | ICD-10-CM | POA: Insufficient documentation

## 2013-06-24 LAB — SURGICAL PCR SCREEN
MRSA, PCR: NEGATIVE
Staphylococcus aureus: NEGATIVE

## 2013-06-24 LAB — TYPE AND SCREEN
ABO/RH(D): B POS
Antibody Screen: NEGATIVE

## 2013-06-24 LAB — BASIC METABOLIC PANEL
BUN: 7 mg/dL (ref 6–23)
CO2: 23 mEq/L (ref 19–32)
Calcium: 9.4 mg/dL (ref 8.4–10.5)
Chloride: 103 mEq/L (ref 96–112)
Creatinine, Ser: 0.72 mg/dL (ref 0.50–1.10)
GFR calc Af Amer: >90 mL/min (ref >90)
GFR calc non Af Amer: >90 mL/min (ref >90)
Glucose, Bld: 157 mg/dL — ABNORMAL HIGH (ref 70–99)
Potassium: 3.6 mEq/L (ref 3.5–5.1)
Sodium: 138 mEq/L (ref 135–145)

## 2013-06-24 LAB — CBC
HCT: 43.1 % (ref 36.0–46.0)
Hemoglobin: 14.9 g/dL (ref 12.0–15.0)
MCH: 30.1 pg (ref 26.0–34.0)
MCHC: 34.6 g/dL (ref 30.0–36.0)
MCV: 87.1 fL (ref 78.0–100.0)
Platelets: 235 10*3/uL (ref 150–400)
RBC: 4.95 MIL/uL (ref 3.87–5.11)
RDW: 14.5 % (ref 11.5–15.5)
WBC: 9.9 10*3/uL (ref 4.0–10.5)

## 2013-06-24 LAB — ABO/RH: ABO/RH(D): B POS

## 2013-06-24 LAB — PROTIME-INR
INR: 0.99 (ref 0.00–1.49)
Prothrombin Time: 12.9 seconds (ref 11.6–15.2)

## 2013-06-24 LAB — APTT: aPTT: 30 seconds (ref 24–37)

## 2013-06-24 MED ORDER — CHLORHEXIDINE GLUCONATE 4 % EX LIQD: 60.0000 mL | Freq: Once | CUTANEOUS | Status: DC

## 2013-07-01 MED ORDER — CEFAZOLIN SODIUM-DEXTROSE 2-3 GM-% IV SOLR
2.0000 g | INTRAVENOUS | Status: AC
  Administered 2013-07-02: 2 g via INTRAVENOUS
  Filled 2013-07-01: qty 50

## 2013-07-02 ENCOUNTER — Inpatient Hospital Stay (HOSPITAL_COMMUNITY): Payer: BC Managed Care – PPO | Admitting: Anesthesiology

## 2013-07-02 ENCOUNTER — Inpatient Hospital Stay (HOSPITAL_COMMUNITY)
Admission: RE | Admit: 2013-07-02 | Discharge: 2013-07-06 | DRG: 470 | Disposition: A | Discharge status: Home Health | Payer: BC Managed Care – PPO | Source: Ambulatory Visit | Attending: Orthopedic Surgery | Admitting: Orthopedic Surgery

## 2013-07-02 ENCOUNTER — Encounter (HOSPITAL_COMMUNITY)
Admission: RE | Disposition: A | Discharge status: Home Health | Payer: Self-pay | Source: Ambulatory Visit | Attending: Orthopedic Surgery

## 2013-07-02 ENCOUNTER — Encounter (HOSPITAL_COMMUNITY): Payer: BC Managed Care – PPO | Admitting: Vascular Surgery

## 2013-07-02 ENCOUNTER — Encounter (HOSPITAL_COMMUNITY): Payer: Self-pay | Admitting: *Deleted

## 2013-07-02 ENCOUNTER — Inpatient Hospital Stay (HOSPITAL_COMMUNITY): Payer: BC Managed Care – PPO

## 2013-07-02 DIAGNOSIS — Z9089 Acquired absence of other organs: Secondary | ICD-10-CM

## 2013-07-02 DIAGNOSIS — E119 Type 2 diabetes mellitus without complications: Secondary | ICD-10-CM | POA: Diagnosis present

## 2013-07-02 DIAGNOSIS — Z6835 Body mass index (BMI) 35.0-35.9, adult: Secondary | ICD-10-CM

## 2013-07-02 DIAGNOSIS — Z23 Encounter for immunization: Secondary | ICD-10-CM

## 2013-07-02 DIAGNOSIS — E669 Obesity, unspecified: Secondary | ICD-10-CM | POA: Diagnosis present

## 2013-07-02 DIAGNOSIS — Z79899 Other long term (current) drug therapy: Secondary | ICD-10-CM

## 2013-07-02 DIAGNOSIS — K219 Gastro-esophageal reflux disease without esophagitis: Secondary | ICD-10-CM | POA: Diagnosis present

## 2013-07-02 DIAGNOSIS — I10 Essential (primary) hypertension: Secondary | ICD-10-CM | POA: Diagnosis present

## 2013-07-02 DIAGNOSIS — M1710 Unilateral primary osteoarthritis, unspecified knee: Principal | ICD-10-CM | POA: Diagnosis present

## 2013-07-02 DIAGNOSIS — R110 Nausea: Secondary | ICD-10-CM | POA: Diagnosis not present

## 2013-07-02 DIAGNOSIS — M1712 Unilateral primary osteoarthritis, left knee: Secondary | ICD-10-CM

## 2013-07-02 DIAGNOSIS — I959 Hypotension, unspecified: Secondary | ICD-10-CM | POA: Diagnosis not present

## 2013-07-02 HISTORY — PX: TOTAL KNEE ARTHROPLASTY: SHX125

## 2013-07-02 LAB — GLUCOSE, CAPILLARY
Glucose-Capillary: 187 mg/dL — ABNORMAL HIGH (ref 70–99)
Glucose-Capillary: 188 mg/dL — ABNORMAL HIGH (ref 70–99)
Glucose-Capillary: 243 mg/dL — ABNORMAL HIGH (ref 70–99)
Glucose-Capillary: 204 mg/dL — ABNORMAL HIGH (ref 70–99)
Glucose-Capillary: 219 mg/dL — ABNORMAL HIGH (ref 70–99)

## 2013-07-02 SURGERY — ARTHROPLASTY, KNEE, TOTAL
Anesthesia: General | Site: Knee | Laterality: Left | Wound class: Clean

## 2013-07-02 MED ORDER — SODIUM CHLORIDE 0.9 % IV SOLN
10.0000 mg | INTRAVENOUS | Status: DC | PRN
  Administered 2013-07-02: 10 ug/min via INTRAVENOUS

## 2013-07-02 MED ORDER — GLYCOPYRROLATE 0.2 MG/ML IJ SOLN
INTRAMUSCULAR | Status: DC | PRN
  Administered 2013-07-02: 0.4 mg via INTRAVENOUS

## 2013-07-02 MED ORDER — DEXAMETHASONE SODIUM PHOSPHATE 4 MG/ML IJ SOLN
INTRAMUSCULAR | Status: DC | PRN
  Administered 2013-07-02: 4 mg via INTRAVENOUS

## 2013-07-02 MED ORDER — ACETAMINOPHEN 650 MG RE SUPP: 650.0000 mg | Freq: Four times a day (QID) | RECTAL | Status: DC | PRN

## 2013-07-02 MED ORDER — GLIPIZIDE 10 MG PO TABS
10.0000 mg | ORAL_TABLET | Freq: Two times a day (BID) | ORAL | Status: DC
  Administered 2013-07-02 – 2013-07-06 (×8): 10 mg via ORAL
  Filled 2013-07-02 (×10): qty 1

## 2013-07-02 MED ORDER — OXYCODONE HCL 5 MG PO TABS
5.0000 mg | ORAL_TABLET | ORAL | Status: DC | PRN
  Administered 2013-07-03 – 2013-07-05 (×12): 10 mg via ORAL
  Administered 2013-07-05: 5 mg via ORAL
  Administered 2013-07-05 – 2013-07-06 (×4): 10 mg via ORAL
  Filled 2013-07-02 (×6): qty 2
  Filled 2013-07-02: qty 1
  Filled 2013-07-02 (×11): qty 2

## 2013-07-02 MED ORDER — NEOSTIGMINE METHYLSULFATE 1 MG/ML IJ SOLN
INTRAMUSCULAR | Status: DC | PRN
  Administered 2013-07-02: 3 mg via INTRAVENOUS

## 2013-07-02 MED ORDER — ONDANSETRON HCL 4 MG PO TABS
4.0000 mg | ORAL_TABLET | Freq: Four times a day (QID) | ORAL | Status: DC | PRN
  Administered 2013-07-05: 4 mg via ORAL
  Filled 2013-07-02: qty 1

## 2013-07-02 MED ORDER — PHENOL 1.4 % MT LIQD: 1.0000 | OROMUCOSAL | Status: DC | PRN

## 2013-07-02 MED ORDER — BUPIVACAINE-EPINEPHRINE PF 0.5-1:200000 % IJ SOLN
INTRAMUSCULAR | Status: DC | PRN
  Administered 2013-07-02: 30 mL

## 2013-07-02 MED ORDER — WARFARIN SODIUM 7.5 MG PO TABS
7.5000 mg | ORAL_TABLET | Freq: Once | ORAL | Status: AC
  Administered 2013-07-02: 7.5 mg via ORAL
  Filled 2013-07-02: qty 1

## 2013-07-02 MED ORDER — WARFARIN VIDEO
Freq: Once | Status: AC
  Administered 2013-07-03: 12:00:00

## 2013-07-02 MED ORDER — ONDANSETRON HCL 4 MG/2ML IJ SOLN
INTRAMUSCULAR | Status: DC | PRN
  Administered 2013-07-02 (×2): 4 mg via INTRAVENOUS

## 2013-07-02 MED ORDER — METHOCARBAMOL 100 MG/ML IJ SOLN
500.0000 mg | Freq: Four times a day (QID) | INTRAVENOUS | Status: DC | PRN
  Filled 2013-07-02: qty 5

## 2013-07-02 MED ORDER — ROCURONIUM BROMIDE 100 MG/10ML IV SOLN
INTRAVENOUS | Status: DC | PRN
  Administered 2013-07-02: 40 mg via INTRAVENOUS

## 2013-07-02 MED ORDER — OXYCODONE HCL 5 MG PO TABS
5.0000 mg | ORAL_TABLET | Freq: Once | ORAL | Status: AC | PRN
  Administered 2013-07-02: 5 mg via ORAL

## 2013-07-02 MED ORDER — ACETAMINOPHEN 325 MG PO TABS
650.0000 mg | ORAL_TABLET | Freq: Four times a day (QID) | ORAL | Status: DC | PRN
  Administered 2013-07-03: 650 mg via ORAL
  Filled 2013-07-02: qty 2

## 2013-07-02 MED ORDER — HYDROMORPHONE HCL PF 1 MG/ML IJ SOLN
0.2500 mg | INTRAMUSCULAR | Status: DC | PRN
  Administered 2013-07-02 (×4): 0.5 mg via INTRAVENOUS

## 2013-07-02 MED ORDER — PNEUMOCOCCAL VAC POLYVALENT 25 MCG/0.5ML IJ INJ
0.5000 mL | INJECTION | INTRAMUSCULAR | Status: AC
  Administered 2013-07-03: 0.5 mL via INTRAMUSCULAR
  Filled 2013-07-02: qty 0.5

## 2013-07-02 MED ORDER — HYDROMORPHONE HCL PF 1 MG/ML IJ SOLN
INTRAMUSCULAR | Status: AC
  Filled 2013-07-02: qty 1

## 2013-07-02 MED ORDER — METOCLOPRAMIDE HCL 5 MG/ML IJ SOLN: 5.0000 mg | Freq: Three times a day (TID) | INTRAMUSCULAR | Status: DC | PRN

## 2013-07-02 MED ORDER — LACTATED RINGERS IV SOLN
INTRAVENOUS | Status: DC | PRN
  Administered 2013-07-02 (×2): via INTRAVENOUS

## 2013-07-02 MED ORDER — PHENYLEPHRINE HCL 10 MG/ML IJ SOLN
INTRAMUSCULAR | Status: DC | PRN
  Administered 2013-07-02 (×3): 80 ug via INTRAVENOUS

## 2013-07-02 MED ORDER — METHOCARBAMOL 500 MG PO TABS
ORAL_TABLET | ORAL | Status: AC
  Filled 2013-07-02: qty 1

## 2013-07-02 MED ORDER — CIPROFLOXACIN IN D5W 400 MG/200ML IV SOLN
400.0000 mg | Freq: Two times a day (BID) | INTRAVENOUS | Status: DC
  Administered 2013-07-02 – 2013-07-04 (×4): 400 mg via INTRAVENOUS
  Filled 2013-07-02 (×7): qty 200

## 2013-07-02 MED ORDER — OXYCODONE HCL 5 MG PO TABS
ORAL_TABLET | ORAL | Status: AC
  Filled 2013-07-02: qty 1

## 2013-07-02 MED ORDER — FERROUS SULFATE 325 (65 FE) MG PO TABS
325.0000 mg | ORAL_TABLET | Freq: Three times a day (TID) | ORAL | Status: DC
  Administered 2013-07-03 – 2013-07-06 (×7): 325 mg via ORAL
  Filled 2013-07-02 (×15): qty 1

## 2013-07-02 MED ORDER — HYDROCODONE-ACETAMINOPHEN 5-325 MG PO TABS
1.0000 | ORAL_TABLET | ORAL | Status: DC | PRN
  Administered 2013-07-02 – 2013-07-06 (×11): 1 via ORAL
  Filled 2013-07-02 (×3): qty 1
  Filled 2013-07-02: qty 2
  Filled 2013-07-02 (×8): qty 1

## 2013-07-02 MED ORDER — MEPERIDINE HCL 25 MG/ML IJ SOLN: 6.2500 mg | INTRAMUSCULAR | Status: DC | PRN

## 2013-07-02 MED ORDER — SODIUM CHLORIDE 0.9 % IV SOLN
INTRAVENOUS | Status: DC
  Administered 2013-07-02: 75 mL via INTRAVENOUS
  Administered 2013-07-03 – 2013-07-04 (×3): via INTRAVENOUS

## 2013-07-02 MED ORDER — METOCLOPRAMIDE HCL 10 MG PO TABS
5.0000 mg | ORAL_TABLET | Freq: Three times a day (TID) | ORAL | Status: DC | PRN
  Administered 2013-07-02: 10 mg via ORAL
  Filled 2013-07-02: qty 1

## 2013-07-02 MED ORDER — METFORMIN HCL 500 MG PO TABS
500.0000 mg | ORAL_TABLET | Freq: Two times a day (BID) | ORAL | Status: DC
  Administered 2013-07-02 – 2013-07-06 (×8): 500 mg via ORAL
  Filled 2013-07-02 (×10): qty 1

## 2013-07-02 MED ORDER — OXYCODONE HCL 5 MG/5ML PO SOLN: 5.0000 mg | Freq: Once | ORAL | Status: AC | PRN

## 2013-07-02 MED ORDER — PROMETHAZINE HCL 25 MG/ML IJ SOLN: 6.2500 mg | INTRAMUSCULAR | Status: DC | PRN

## 2013-07-02 MED ORDER — BISACODYL 10 MG RE SUPP
10.0000 mg | Freq: Every day | RECTAL | Status: DC | PRN
  Administered 2013-07-05: 10 mg via RECTAL
  Filled 2013-07-02: qty 1

## 2013-07-02 MED ORDER — MIDAZOLAM HCL 5 MG/5ML IJ SOLN
INTRAMUSCULAR | Status: DC | PRN
  Administered 2013-07-02: 2 mg via INTRAVENOUS

## 2013-07-02 MED ORDER — SODIUM CHLORIDE 0.9 % IR SOLN
Status: DC | PRN
  Administered 2013-07-02 (×2): 1000 mL

## 2013-07-02 MED ORDER — WARFARIN - PHARMACIST DOSING INPATIENT
Freq: Every day | Status: DC
  Administered 2013-07-02: 17:00:00

## 2013-07-02 MED ORDER — ONDANSETRON HCL 4 MG/2ML IJ SOLN
4.0000 mg | Freq: Four times a day (QID) | INTRAMUSCULAR | Status: DC | PRN
  Administered 2013-07-02 – 2013-07-04 (×2): 4 mg via INTRAVENOUS
  Filled 2013-07-02 (×2): qty 2

## 2013-07-02 MED ORDER — CEFAZOLIN SODIUM-DEXTROSE 2-3 GM-% IV SOLR
2.0000 g | Freq: Four times a day (QID) | INTRAVENOUS | Status: AC
  Administered 2013-07-02 (×2): 2 g via INTRAVENOUS
  Filled 2013-07-02 (×2): qty 50

## 2013-07-02 MED ORDER — DAPAGLIFLOZIN PROPANEDIOL 5 MG PO TABS
5.0000 mg | ORAL_TABLET | Freq: Every day | ORAL | Status: DC
  Administered 2013-07-04 – 2013-07-06 (×3): 5 mg via ORAL
  Filled 2013-07-02 (×2): qty 1

## 2013-07-02 MED ORDER — MENTHOL 3 MG MT LOZG: 1.0000 | LOZENGE | OROMUCOSAL | Status: DC | PRN

## 2013-07-02 MED ORDER — METHOCARBAMOL 500 MG PO TABS
500.0000 mg | ORAL_TABLET | Freq: Four times a day (QID) | ORAL | Status: DC | PRN
  Administered 2013-07-02 – 2013-07-06 (×8): 500 mg via ORAL
  Filled 2013-07-02 (×8): qty 1

## 2013-07-02 MED ORDER — FENTANYL CITRATE 0.05 MG/ML IJ SOLN
INTRAMUSCULAR | Status: DC | PRN
  Administered 2013-07-02 (×2): 50 ug via INTRAVENOUS
  Administered 2013-07-02: 100 ug via INTRAVENOUS
  Administered 2013-07-02: 50 ug via INTRAVENOUS

## 2013-07-02 MED ORDER — HYDROMORPHONE HCL PF 1 MG/ML IJ SOLN
1.0000 mg | INTRAMUSCULAR | Status: DC | PRN
  Administered 2013-07-02 – 2013-07-03 (×3): 1 mg via INTRAVENOUS
  Filled 2013-07-02 (×4): qty 1

## 2013-07-02 MED ORDER — PATIENT'S GUIDE TO USING COUMADIN BOOK
Freq: Once | Status: AC
  Administered 2013-07-02: 17:00:00
  Filled 2013-07-02: qty 1

## 2013-07-02 MED ORDER — TRAMADOL HCL 50 MG PO TABS
50.0000 mg | ORAL_TABLET | Freq: Four times a day (QID) | ORAL | Status: DC | PRN
  Administered 2013-07-03 – 2013-07-04 (×6): 50 mg via ORAL
  Filled 2013-07-02 (×6): qty 1

## 2013-07-02 MED ORDER — CELECOXIB 200 MG PO CAPS
200.0000 mg | ORAL_CAPSULE | Freq: Two times a day (BID) | ORAL | Status: DC
  Administered 2013-07-02 – 2013-07-06 (×8): 200 mg via ORAL
  Filled 2013-07-02 (×9): qty 1

## 2013-07-02 MED ORDER — LIDOCAINE HCL (CARDIAC) 20 MG/ML IV SOLN
INTRAVENOUS | Status: DC | PRN
  Administered 2013-07-02: 50 mg via INTRAVENOUS

## 2013-07-02 MED ORDER — LINAGLIPTIN 5 MG PO TABS
5.0000 mg | ORAL_TABLET | Freq: Every day | ORAL | Status: DC
  Administered 2013-07-02 – 2013-07-06 (×5): 5 mg via ORAL
  Filled 2013-07-02 (×5): qty 1

## 2013-07-02 MED ORDER — PROPOFOL 10 MG/ML IV BOLUS
INTRAVENOUS | Status: DC | PRN
  Administered 2013-07-02: 150 mg via INTRAVENOUS

## 2013-07-02 MED ORDER — SCOPOLAMINE 1 MG/3DAYS TD PT72
1.0000 | MEDICATED_PATCH | TRANSDERMAL | Status: DC
  Administered 2013-07-02: 1 via TRANSDERMAL

## 2013-07-02 MED ORDER — MIDAZOLAM HCL 2 MG/2ML IJ SOLN: 0.5000 mg | Freq: Once | INTRAMUSCULAR | Status: DC | PRN

## 2013-07-02 MED ORDER — LISINOPRIL 10 MG PO TABS
10.0000 mg | ORAL_TABLET | Freq: Every day | ORAL | Status: DC
  Administered 2013-07-02: 10 mg via ORAL
  Filled 2013-07-02 (×5): qty 1

## 2013-07-02 MED ORDER — SCOPOLAMINE 1 MG/3DAYS TD PT72
MEDICATED_PATCH | TRANSDERMAL | Status: AC
  Filled 2013-07-02: qty 1

## 2013-07-02 SURGICAL SUPPLY — 56 items
BANDAGE ELASTIC 4 VELCRO ST LF (GAUZE/BANDAGES/DRESSINGS) ×2 IMPLANT
BANDAGE ESMARK 6X9 LF (GAUZE/BANDAGES/DRESSINGS) ×1 IMPLANT
BANDAGE GAUZE ELAST BULKY 4 IN (GAUZE/BANDAGES/DRESSINGS) ×2 IMPLANT
BLADE SAG 18X100X1.27 (BLADE) ×2 IMPLANT
BLADE SAW SGTL 13.0X1.19X90.0M (BLADE) ×2 IMPLANT
BNDG CMPR 9X6 STRL LF SNTH (GAUZE/BANDAGES/DRESSINGS) ×1
BNDG CMPR MED 10X6 ELC LF (GAUZE/BANDAGES/DRESSINGS) ×1
BNDG ELASTIC 6X10 VLCR STRL LF (GAUZE/BANDAGES/DRESSINGS) ×2 IMPLANT
BNDG ESMARK 6X9 LF (GAUZE/BANDAGES/DRESSINGS) ×2
BOWL SMART MIX CTS (DISPOSABLE) ×2 IMPLANT
CAPT RP KNEE ×2 IMPLANT
CEMENT HV SMART SET (Cement) ×4 IMPLANT
CLOTH BEACON ORANGE TIMEOUT ST (SAFETY) ×2 IMPLANT
COVER SURGICAL LIGHT HANDLE (MISCELLANEOUS) ×2 IMPLANT
CUFF TOURNIQUET SINGLE 34IN LL (TOURNIQUET CUFF) ×2 IMPLANT
CUFF TOURNIQUET SINGLE 44IN (TOURNIQUET CUFF) IMPLANT
DRAPE EXTREMITY T 121X128X90 (DRAPE) ×2 IMPLANT
DRAPE PROXIMA HALF (DRAPES) ×2 IMPLANT
DRAPE U-SHAPE 47X51 STRL (DRAPES) ×2 IMPLANT
DRSG ADAPTIC 3X8 NADH LF (GAUZE/BANDAGES/DRESSINGS) ×2 IMPLANT
DRSG PAD ABDOMINAL 8X10 ST (GAUZE/BANDAGES/DRESSINGS) ×2 IMPLANT
DURAPREP 26ML APPLICATOR (WOUND CARE) ×2 IMPLANT
ELECT CAUTERY BLADE 6.4 (BLADE) ×2 IMPLANT
ELECT REM PT RETURN 9FT ADLT (ELECTROSURGICAL) ×2
ELECTRODE REM PT RTRN 9FT ADLT (ELECTROSURGICAL) ×1 IMPLANT
GLOVE BIOGEL PI ORTHO PRO 7.5 (GLOVE) ×1
GLOVE BIOGEL PI ORTHO PRO SZ8 (GLOVE) ×1
GLOVE ORTHO TXT STRL SZ7.5 (GLOVE) ×2 IMPLANT
GLOVE PI ORTHO PRO STRL 7.5 (GLOVE) ×1 IMPLANT
GLOVE PI ORTHO PRO STRL SZ8 (GLOVE) ×1 IMPLANT
GLOVE SURG ORTHO 8.5 STRL (GLOVE) ×2 IMPLANT
GOWN STRL REIN XL XLG (GOWN DISPOSABLE) ×6 IMPLANT
HANDPIECE INTERPULSE COAX TIP (DISPOSABLE) ×2
IMMOBILIZER KNEE 22 UNIV (SOFTGOODS) ×2 IMPLANT
KIT BASIN OR (CUSTOM PROCEDURE TRAY) ×2 IMPLANT
KIT MANIFOLD (MISCELLANEOUS) ×2 IMPLANT
KIT ROOM TURNOVER OR (KITS) ×2 IMPLANT
MANIFOLD NEPTUNE II (INSTRUMENTS) ×2 IMPLANT
NS IRRIG 1000ML POUR BTL (IV SOLUTION) ×2 IMPLANT
PACK TOTAL JOINT (CUSTOM PROCEDURE TRAY) ×2 IMPLANT
PAD ARMBOARD 7.5X6 YLW CONV (MISCELLANEOUS) ×4 IMPLANT
SET HNDPC FAN SPRY TIP SCT (DISPOSABLE) ×1 IMPLANT
SPONGE GAUZE 4X4 12PLY (GAUZE/BANDAGES/DRESSINGS) ×2 IMPLANT
STRIP CLOSURE SKIN 1/2X4 (GAUZE/BANDAGES/DRESSINGS) ×2 IMPLANT
SUCTION FRAZIER TIP 10 FR DISP (SUCTIONS) ×2 IMPLANT
SUT MNCRL AB 3-0 PS2 18 (SUTURE) ×2 IMPLANT
SUT VIC AB 0 CT1 27 (SUTURE) ×4
SUT VIC AB 0 CT1 27XBRD ANBCTR (SUTURE) ×2 IMPLANT
SUT VIC AB 1 CT1 27 (SUTURE) ×6
SUT VIC AB 1 CT1 27XBRD ANBCTR (SUTURE) ×3 IMPLANT
SUT VIC AB 2-0 CT1 27 (SUTURE) ×4
SUT VIC AB 2-0 CT1 TAPERPNT 27 (SUTURE) ×2 IMPLANT
TOWEL OR 17X24 6PK STRL BLUE (TOWEL DISPOSABLE) ×2 IMPLANT
TOWEL OR 17X26 10 PK STRL BLUE (TOWEL DISPOSABLE) ×2 IMPLANT
TRAY FOLEY CATH 16FRSI W/METER (SET/KITS/TRAYS/PACK) IMPLANT
WATER STERILE IRR 1000ML POUR (IV SOLUTION) ×4 IMPLANT

## 2013-07-02 NOTE — Transfer of Care (Signed)
Immediate Anesthesia Transfer of Care Note  Patient: Claudia [name]  Procedure(s) Performed: Procedure(s): LEFT TOTAL KNEE ARTHROPLASTY (Left)  Patient Location: PACU  Anesthesia Type:General  Level of Consciousness: awake, alert  and oriented  Airway & Oxygen Therapy: Patient Spontanous Breathing and Patient connected to nasal cannula oxygen  Post-op Assessment: Report given to PACU RN, Post -op Vital signs reviewed and stable and Patient moving all extremities X 4  Post vital signs: Reviewed and stable  Complications: No apparent anesthesia complications

## 2013-07-02 NOTE — Anesthesia Procedure Notes (Addendum)
Anesthesia Regional Block:  Femoral nerve block  Pre-Anesthetic Checklist: ,, timeout performed, Correct Patient, Correct Site, Correct Laterality, Correct Procedure, Correct Position, site marked, Risks and benefits discussed,  Surgical consent,  Pre-op evaluation,  At surgeon's request and post-op pain management  Laterality: Left and Lower  Prep: chloraprep       Needles:   Needle Type: Echogenic Stimulator Needle      Needle Gauge: 22 and 22 G    Additional Needles:  Procedures: nerve stimulator Femoral nerve block  Nerve Stimulator or Paresthesia:  Response: patella twitch, 0.4 mA, 0.1 ms,   Additional Responses:   Narrative:  Start time: 07/02/2013 7:00 AM End time: 07/02/2013 7:06 AM Injection made incrementally with aspirations every 5 mL.  Performed by: Personally  Anesthesiologist: Sandford Craze, MD  Additional Notes: Pt identified in Holding room.  Monitors applied. Working IV access confirmed. Sterile prep L groin.  #22ga PNS to patella twitch at 0.12mA threshold.  30cc 0.5% Bupivacaine with 1:200k epi injected incrementally after negative test dose.  Patient asymptomatic, VSS, no heme aspirated, tolerated well.  Sandford Craze, MD  Femoral nerve block Procedure Name: Intubation Date/Time: 07/02/2013 7:30 AM Performed by: Quentin Ore Pre-anesthesia Checklist: Patient identified, Emergency Drugs available, Suction available, Patient being monitored and Timeout performed Patient Re-evaluated:Patient Re-evaluated prior to inductionOxygen Delivery Method: Circle system utilized Preoxygenation: Pre-oxygenation with 100% oxygen Intubation Type: IV induction Ventilation: Mask ventilation without difficulty Laryngoscope Size: Mac and 3 Grade View: Grade I Tube type: Oral Tube size: 7.0 mm Number of attempts: 1 Airway Equipment and Method: Stylet Placement Confirmation: ETT inserted through vocal cords under direct vision,  positive ETCO2 and breath sounds checked-  equal and bilateral Secured at: 21 cm Tube secured with: Tape Dental Injury: Teeth and Oropharynx as per pre-operative assessment

## 2013-07-02 NOTE — Brief Op Note (Signed)
Brief Op Note:  Pre-op:  Left knee OA, end stage Post Op: same Procedure:  Left TKR Surgeon: Ranell Patrick Assist: Durwin Nora, PA-C No complications Stable to PACU  Beverely Low MD

## 2013-07-02 NOTE — Progress Notes (Signed)
Orthopedic Tech Progress Note Patient Details:  Claudia [name] 1955/09/05 [id number]  CPM Left Knee CPM Left Knee: On Left Knee Flexion (Degrees): 60 Left Knee Extension (Degrees): 0 Additional Comments: Trapeze bar   Shawnie Pons 07/02/2013, 12:15 PM

## 2013-07-02 NOTE — Anesthesia Postprocedure Evaluation (Signed)
Anesthesia Post Note  Patient: Claudia [name]  Procedure(s) Performed: Procedure(s) (LRB): LEFT TOTAL KNEE ARTHROPLASTY (Left)  Anesthesia type: General  Patient location: PACU  Post pain: Pain level controlled and Adequate analgesia  Post assessment: Post-op Vital signs reviewed, Patient's Cardiovascular Status Stable, Respiratory Function Stable, Patent Airway and Pain level controlled  Last Vitals:  Filed Vitals:   07/02/13 1000  BP: 97/36  Pulse: 80  Temp: 36.4 C  Resp: 19    Post vital signs: Reviewed and stable  Level of consciousness: awake, alert  and oriented  Complications: No apparent anesthesia complications

## 2013-07-02 NOTE — Progress Notes (Signed)
   CARE MANAGEMENT NOTE 07/02/2013  Patient:  Claudia [name], Claudia [name]   Account Number:  [id number]  Date Initiated:  07/02/2013  Documentation initiated by:  [facility]  Subjective/Objective Assessment:   LEFT TOTAL KNEE ARTHROPLASTY (Left)     Action/Plan:   waiting PT/OT evaluation   Anticipated DC Date:  07/04/2013   Anticipated DC Plan:  HOME W HOME HEALTH SERVICES      DC Planning Services  CM consult      Choice offered to / List presented to:          [facility] arranged  HH-2 PT      [facility] agency  [facility]   Status of service:  In process, will continue to follow Medicare Important Message given?   (If response is "NO", the following Medicare IM given date fields will be blank) Date Medicare IM given:   Date Additional Medicare IM given:    Discharge Disposition:    Per UR Regulation:    If discussed at Long Length of Stay Meetings, dates discussed:    Comments:  07/02/2013 1430 NCM spoke to pt and preoperatively set up with [place]. [employer] will deliver DME. Pt states she lives alone and is interested in IP CIR but not SNF. She will have assistance in the home in the evenings. Her dtr can assist after she gets off work but not during the day. Her sister will be available on 07/09/2013. Waiting final dc disposition.  Isidoro Donning RN CCM Case Mgmt phone [phone]

## 2013-07-02 NOTE — Care Management Utilization Note (Signed)
Utilization review completed. Veroncia Jezek, RN BSN 

## 2013-07-02 NOTE — Interval H&P Note (Signed)
H&P documentation: see above H+P  -History and Physical Reviewed  -Patient has been re-examined  -No change in the plan of care.  Plan L TKR for end-staged arthritis.  Claudia [name],STEVEN R

## 2013-07-02 NOTE — Evaluation (Signed)
Physical Therapy Evaluation Patient Details Name: Claudia [name] MRN: [id number] DOB: 10/20/1955 Today's Date: 07/02/2013 Time: 4098-1191 PT Time Calculation (min): 36 min  PT Assessment / Plan / Recommendation History of Present Illness  Pt is a 57 y/o female admitted s/p L TKA on 07/02/13.  Clinical Impression  This patient presents with acute pain and decreased functional independence following the above mentioned procedure. At the time of PT eval, pt was able to perform transfers and ambulation to the recliner with min assist. Pt and family requested CIR consult, and therapist tried to explain qualifications for inpatient rehab, however pt very focused on not d/cing to a SNF due to a recent family death in a nursing home. I have not put in a consult for CIR due to general qualifications and also due to how well the pt did this session. For now the recommendation is d/c home with HHPT to follow. Son reports in hallway that he and his family already have something worked out to where she would have assist when needed if she returned home.      PT Assessment  Patient needs continued PT services    Follow Up Recommendations  Home health PT    Does the patient have the potential to tolerate intense rehabilitation      Barriers to Discharge Decreased caregiver support Family member reports in hallway that they have coverage for pt if she returns home    Equipment Recommendations  Rolling walker with 5" wheels;3in1 (PT)    Recommendations for Other Services     Frequency 7X/week    Precautions / Restrictions Precautions Precautions: Fall;Knee Precaution Comments: Pt and family education regarding towel roll under ankle and no pillow under knee Required Braces or Orthoses: Knee Immobilizer - Left Knee Immobilizer - Left: On when out of bed or walking Restrictions Weight Bearing Restrictions: Yes LLE Weight Bearing: Weight bearing as tolerated   Pertinent Vitals/Pain Pt received  pain medication at beginning of session.       Mobility  Bed Mobility Bed Mobility: Supine to Sit;Sitting - Scoot to Edge of Bed Supine to Sit: 4: Min assist;HOB elevated;With rails Sitting - Scoot to Edge of Bed: 5: Supervision Details for Bed Mobility Assistance: Min assist for movement and support of LLE to EOB. Transfers Transfers: Sit to Stand;Stand to Sit Sit to Stand: 4: Min assist;From bed;With upper extremity assist Stand to Sit: 4: Min assist;To chair/3-in-1;With upper extremity assist Details for Transfer Assistance: VC's for hand placement on seated surface. Good control to descend to chair. Ambulation/Gait Ambulation/Gait Assistance: 4: Min assist Ambulation Distance (Feet): 5 Feet Assistive device: Rolling walker Ambulation/Gait Assistance Details: Min assist for walker placement, especially during turns towards the chair.  Gait Pattern: Step-to pattern Gait velocity: Decreased Stairs: No    Exercises Total Joint Exercises Ankle Circles/Pumps: 10 reps Quad Sets: 10 reps   PT Diagnosis: Difficulty walking;Acute pain  PT Problem List: Decreased strength;Decreased range of motion;Decreased activity tolerance;Decreased balance;Decreased mobility;Decreased knowledge of use of DME;Decreased safety awareness;Pain PT Treatment Interventions: DME instruction;Gait training;Stair training;Functional mobility training;Therapeutic activities;Therapeutic exercise;Neuromuscular re-education;Patient/family education     PT Goals(Current goals can be found in the care plan section) Acute Rehab PT Goals Patient Stated Goal: To return home after CIR PT Goal Formulation: With patient/family Time For Goal Achievement: 07/09/13 Potential to Achieve Goals: Good  Visit Information  Last PT Received On: 07/02/13 Assistance Needed: +1 History of Present Illness: Pt is a 57 y/o female admitted s/p L TKA on 07/02/13.  Prior Functioning  Home Living Family/patient expects to  be discharged to:: Private residence Living Arrangements: Alone Available Help at Discharge: Family;Available PRN/intermittently Type of Home: House Home Access: Stairs to enter [employer] of Steps: 4 Entrance Stairs-Rails: Can reach both Home Layout: One level Home Equipment: Toilet riser Prior Function Level of Independence: Independent Communication Communication: No difficulties Dominant Hand: Right    Cognition  Cognition Arousal/Alertness: Awake/alert Behavior During Therapy: WFL for tasks assessed/performed Overall Cognitive Status: Within Functional Limits for tasks assessed    Extremity/Trunk Assessment Upper Extremity Assessment Upper Extremity Assessment: Overall WFL for tasks assessed Lower Extremity Assessment Lower Extremity Assessment: LLE deficits/detail LLE Deficits / Details: Decreased strength and AROM consistent with L TKA LLE: Unable to fully assess due to pain Cervical / Trunk Assessment Cervical / Trunk Assessment: Normal   Balance    End of Session PT - End of Session Equipment Utilized During Treatment: Gait belt;Left knee immobilizer Activity Tolerance: Patient tolerated treatment well Patient left: in chair;with call bell/phone within reach;with nursing/sitter in room;with family/visitor present Nurse Communication: Mobility status CPM Left Knee CPM Left Knee: Off  GP     Ruthann Cancer 07/02/2013, 5:33 PM  Ruthann Cancer, PT, DPT [phone]

## 2013-07-02 NOTE — Progress Notes (Signed)
ANTICOAGULATION CONSULT NOTE - Initial Consult  Pharmacy Consult for Coumadin Indication: VTE prophylaxis s/p left TKA  No Known Allergies  Patient Measurements: Height: 5' 7.5" (171.5 cm) Weight: 231 lb 3.2 oz (104.872 kg) IBW/kg (Calculated) : 62.75  Vital Signs: Temp: 98 F (36.7 C) (11/21 1137) Temp src: Oral (11/21 0602) BP: 110/52 mmHg (11/21 1137) Pulse Rate: 73 (11/21 1137)  Pre-op Labs from 06/24/13   PT = 12.9 sec, INR 0.99.   Hgb 14.9, Platelet count 235K   Scr 0.72.    No LFTs  Estimated Creatinine Clearance: 96.3 ml/min (by C-G formula based on Cr of 0.72).   Medical History: Past Medical History  Diagnosis Date  . Diabetes mellitus   . Hypertension   . PONV (postoperative nausea and vomiting)    Assessment:   S/p left TKA.  To begin Coumadin for VTE prophylaxis.   Also has SCDs.   Baseline coags normal.  CBC good.  Goal of Therapy:  INR 2-3 Monitor platelets by anticoagulation protocol: Yes   Plan:   Coumadin 7.5 mg tonight.  Daily PT/INR.  Coumadin education materials ordered.  Coumadin education prior to discharge.    Ferrous sulfate 325 mg PO tid ordered post-op.   May not need that often with pre-op Hgb 14.9.   Add stool softener?    Dennie Fetters, [place] Pager: [phone] 07/02/2013,2:49 PM

## 2013-07-02 NOTE — Progress Notes (Signed)
Orthopedic Tech Progress Note Patient Details:  Claudia [name] December 05, 1955 [id number] Put on cpm at 8:45 pm LLE 0-60 Patient ID: [name], female   DOB: [birth year], 56 y.o.   MRN: [id number]   [name] 07/02/2013, 8:46 PM

## 2013-07-02 NOTE — Preoperative (Signed)
Beta Blockers   Reason not to administer Beta Blockers:Not Applicable 

## 2013-07-02 NOTE — Anesthesia Preprocedure Evaluation (Addendum)
Anesthesia Evaluation  Patient identified by MRN, date of birth, ID band Patient awake    Reviewed: Allergy & Precautions, H&P , NPO status , Patient's Chart, lab work & pertinent test results  History of Anesthesia Complications (+) PONV and history of anesthetic complications  Airway Mallampati: II TM Distance: >3 FB Neck ROM: Full    Dental  (+) Partial Lower, Partial Upper and Dental Advisory Given   Pulmonary neg pulmonary ROS,  breath sounds clear to auscultation  Pulmonary exam normal       Cardiovascular hypertension, Pt. on medications Rhythm:Regular Rate:Normal     Neuro/Psych negative neurological ROS     GI/Hepatic Neg liver ROS, GERD-  Medicated and Controlled,  Endo/Other  diabetes (glu 187), Well Controlled, Type obesity  Renal/GU negative Renal ROS     Musculoskeletal   Abdominal (+) + obese,   Peds  Hematology   Anesthesia Other Findings   Reproductive/Obstetrics                        Anesthesia Physical Anesthesia Plan  ASA: III  Anesthesia Plan: General   Post-op Pain Management:    Induction: Intravenous  Airway Management Planned: Oral ETT  Additional Equipment:   Intra-op Plan:   Post-operative Plan: Extubation in OR  Informed Consent: I have reviewed the patients History and Physical, chart, labs and discussed the procedure including the risks, benefits and alternatives for the proposed anesthesia with the patient or authorized representative who has indicated his/her understanding and acceptance.   Dental advisory given  Plan Discussed with: CRNA, Anesthesiologist and Surgeon  Anesthesia Plan Comments: (Plan routine monitors, GETA)       Anesthesia Quick Evaluation

## 2013-07-03 LAB — URINE CULTURE
Colony Count: NO GROWTH
Culture: NO GROWTH

## 2013-07-03 LAB — BASIC METABOLIC PANEL
BUN: 8 mg/dL (ref 6–23)
CO2: 27 mEq/L (ref 19–32)
Calcium: 8.5 mg/dL (ref 8.4–10.5)
Chloride: 103 mEq/L (ref 96–112)
Creatinine, Ser: 0.8 mg/dL (ref 0.50–1.10)
GFR calc Af Amer: >90 mL/min (ref >90)
GFR calc non Af Amer: 80 mL/min — ABNORMAL LOW (ref >90)
Glucose, Bld: 201 mg/dL — ABNORMAL HIGH (ref 70–99)
Potassium: 3.6 mEq/L (ref 3.5–5.1)
Sodium: 139 mEq/L (ref 135–145)

## 2013-07-03 LAB — GLUCOSE, CAPILLARY
Glucose-Capillary: 191 mg/dL — ABNORMAL HIGH (ref 70–99)
Glucose-Capillary: 224 mg/dL — ABNORMAL HIGH (ref 70–99)
Glucose-Capillary: 171 mg/dL — ABNORMAL HIGH (ref 70–99)
Glucose-Capillary: 175 mg/dL — ABNORMAL HIGH (ref 70–99)

## 2013-07-03 LAB — PROTIME-INR
INR: 1.1 (ref 0.00–1.49)
Prothrombin Time: 14 seconds (ref 11.6–15.2)

## 2013-07-03 LAB — CBC
HCT: 35.4 % — ABNORMAL LOW (ref 36.0–46.0)
Hemoglobin: 11.8 g/dL — ABNORMAL LOW (ref 12.0–15.0)
MCH: 29.9 pg (ref 26.0–34.0)
MCHC: 33.3 g/dL (ref 30.0–36.0)
MCV: 89.6 fL (ref 78.0–100.0)
Platelets: 210 10*3/uL (ref 150–400)
RBC: 3.95 MIL/uL (ref 3.87–5.11)
RDW: 14.8 % (ref 11.5–15.5)
WBC: 12.3 10*3/uL — ABNORMAL HIGH (ref 4.0–10.5)

## 2013-07-03 MED ORDER — SODIUM CHLORIDE 0.9 % IV BOLUS (SEPSIS)
500.0000 mL | Freq: Once | INTRAVENOUS | Status: AC
  Administered 2013-07-03: 500 mL via INTRAVENOUS

## 2013-07-03 MED ORDER — WARFARIN SODIUM 7.5 MG PO TABS
7.5000 mg | ORAL_TABLET | Freq: Once | ORAL | Status: AC
  Administered 2013-07-03: 7.5 mg via ORAL
  Filled 2013-07-03: qty 1

## 2013-07-03 NOTE — Progress Notes (Signed)
   Subjective: 1 Day Post-Op Procedure(s) (LRB): LEFT TOTAL KNEE ARTHROPLASTY (Left)   Patient reports pain as moderate, pain controlled. With medication.  Received a couple of boluses of fluid for hypotension.  Objective:   VITALS:   Filed Vitals:   07/03/13  BP: 85/48  Pulse: 64  Temp: 98.1 F (36.7 C)   Resp: 18    Neurovascular intact Dorsiflexion/Plantar flexion intact Incision: dressing C/D/I No cellulitis present Compartment soft  LABS  Recent Labs  07/03/13 0515  HGB 11.8*  HCT 35.4*  WBC 12.3*  PLT 210     Recent Labs  07/03/13 0515  NA 139  K 3.6  BUN 8  CREATININE 0.80  GLUCOSE 201*     Assessment/Plan: 1 Day Post-Op Procedure(s) (LRB): LEFT TOTAL KNEE ARTHROPLASTY (Left) To hold BP med while BP under 120 / 80 May repeat boluses, H/H = 11.8/35.4 Advance diet Up with therapy D/C IV fluids Discharge home with home health eventually, whenr eady   Anastasio Auerbach. Maizie Garno   PAC  07/03/2013, 8:12 AM

## 2013-07-03 NOTE — Progress Notes (Signed)
Recheck of BP after bolus is 85/48.  Hgb 11.8.  Remains asymptomatic.  Patient with good urine output.  Foley discontinued this am.  MD notified and orders received for repeat bolus, continue to monitor and only light activity with PT this am (bed to chair transfer).

## 2013-07-03 NOTE — Op Note (Signed)
NAMEEVALIE, HARGRAVES NO.:  [id number]  MEDICAL RECORD NO.:  [id number]  LOCATION:  5N03C                        FACILITY:  [facility]  PHYSICIAN:  Almedia Balls. Ranell Patrick, M.D. DATE OF BIRTH:  03/24/1956  DATE OF PROCEDURE:  07/02/2013 DATE OF DISCHARGE:                              OPERATIVE REPORT   PREOPERATIVE DIAGNOSIS:  Left knee end-stage osteoarthritis.  POSTOPERATIVE DIAGNOSIS:  Left knee end-stage osteoarthritis.  PROCEDURE PERFORMED:  Left total knee replacement using DePuy Sigma rotating platform prosthesis.  ATTENDING SURGEON:  Almedia Balls. Ranell Patrick, M.D.  ASSISTANT:  Donnie Coffin. Dixon, PAC, who was scrubbed during the entire procedure and necessary for satisfactory and completion of surgery.  ANESTHESIA:  General anesthesia was used plus femoral block.  ESTIMATED BLOOD LOSS:  Minimal.  FLUID REPLACEMENT:  1000 mL crystalloid.  INSTRUMENT COUNTS:  Correct.  COMPLICATIONS:  There were no complications.  ANTIBIOTICS:  Perioperative antibiotics were given.  INDICATIONS:  The patient is a 57 year old female with worsening left knee pain secondary to end-stage arthritis.  The patient has failed conservative management consisting of injections, modification of activity, anti-inflammatories.  She requires a cane for ambulation and has complained of rest pain and night pain.  She has bone-on-bone arthritis on x-ray on standing films, and she presents desiring total knee arthroplasty to relieve pain and restore function to her knee. Informed consent obtained.  DESCRIPTION OF PROCEDURE:  After adequate level of anesthesia was achieved, the patient was positioned supine on the operating table. Left leg was correctly identified.  Nonsterile tourniquet was placed on proximal thigh and left leg and sterilely prepped and draped in usual manner.  Right leg down, leg was padded appropriately.  The patient was secured to the OR table.  After sterile prep and drape, we  called a time- out.  We then exsanguinated the limb using Esmarch bandage and elevated the tourniquet to 250 mmHg.  Longitudinal midline incision was created with the knee in flexion sharply using a 10 blade scalpel.  Dissection down through subcutaneous tissue using a 10 blade.  We identified the medial parapatellar retinaculum and performed a medial parapatellar arthrotomy.  Using a fresh 10 blade scalpel, we divided the lateral patellofemoral ligaments and everted the patella.  We then went ahead and removed the fat from the proximal portion of the femur, then went ahead and drilled into the distal femur with a step-cut drill.  We irrigated the canal and then placed an intramedullary guide set on 5 degrees left, 10 mm resection.  We resected the distal femur and then went ahead and sized the femur to a size 4 anterior down and made our anterior, posterior, and chamfer cuts with a 4 in 1 block.  We next went and resected ACL, PCL, and remaining meniscal tissues, subluxed to tibia anteriorly, flexed external alignment jig on the tibia, set on perpendicular 9-degree cut with minimal posterior slope.  We resected with the oscillating saw 2 mm off the affected medial side, then checked our flexion-extension gaps and found that we were a little tight on the medial side.  We did a little medial release of some of the MCL to basically in a subperiosteal  peel.  We did also resect the medial osteophyte that could have been tensioning that medial side a little more.  We released some posterior capsule as well on the back of the knee, and we then had symmetric gaps, still with a nice and tight in flexion, a little bit too tight in extension.  We went back and resected 2 mm more off the distal femur and then recut our chamfer cuts and then had nice gaps symmetric at 10 mm.  We then irrigated and then we took the posterior osteophytes off the posterior femur with an osteotome.  We also then prepared  our proximal tibia with the modular drill and keel punch and then once we had that tibial tray sized for a size 4 and the final preparation completed, we completed our box cut on the femur for a 4 narrow and this was a [employer], corporate platform prosthesis.  We then cut that with the oscillating saw.  We placed our trial femur in place, reduced the knee with a size 10 poly insert trial.  We then went ahead and resurfaced our patella starting from 25 thickness down to 17 mm in thickness and then drilled our lugs for the size 38 patella.  We then ranged the knee, had nice stable knee full extension, stable in flexion with normal patellar tracking, removed all trial components, pulse irrigated the knee, and then cemented the components into place using DePuy high viscosity cement.  We placed the knee in extension with a size 10 poly insert to hold that while the cement hardened.  We next went ahead and put the patellar clamp on clamping the patella in place. Once the cement hardened, we removed the excess cement using 0.25-inch curved osteotome.  I then trialed with a +12.5, which we were able to get that in with an nice pop on the medial side.  We removed that and then placed a real 12.5 trial.  We inspected the knee for any excess cement, none was found.  We then went ahead and reduced the knee with the real polyethylene insert in place on the rotating platform prosthesis, ranged the knee, nice and stable in extension and flexion. Patellar tracking was excellent.  We then irrigated again and closed the parapatellar arthrotomy with interrupted #1 Vicryl suture followed by 0 Vicryl subcutaneous closure as well as 2-0 Vicryl subcutaneous closure and 4-0 Monocryl for skin.  Steri-Strips and sterile compressive bandage were applied.  The patient tolerated the surgery well.     Almedia Balls. Ranell Patrick, M.D.     SRN/MEDQ  D:  07/02/2013  T:  07/03/2013  Job:  [id number]

## 2013-07-03 NOTE — Progress Notes (Signed)
07/03/13 1000  PT Visit Information  Last PT Received On 07/03/13  Assistance Needed +1  Clinical Statement Pt limited by dizziness and orthostatic BP's today with session.  supine: 115/80  seated EOB:110/84  standing: 87/64  after transfer to chair: 110/42  All obtained with automatic BP machine RN and MD aware.  History of Present Illness Pt is a 57 y/o female admitted s/p L TKA on 07/02/13.  PT Time Calculation  PT Start Time 0932  PT Stop Time 1000  PT Time Calculation (min) 28 min  Subjective Data  Patient Stated Goal To return home after CIR  Precautions  Required Braces or Orthoses Knee Immobilizer - Left  Knee Immobilizer - Left On when out of bed or walking  Restrictions  LLE Weight Bearing WBAT  Cognition  Arousal/Alertness Awake/alert  Behavior During Therapy WFL for tasks assessed/performed  Overall Cognitive Status Within Functional Limits for tasks assessed  Bed Mobility  Supine to Sit 4: Min assist;HOB flat;With rails  Sitting - Scoot to Edge of Bed 5: Supervision  Details for Bed Mobility Assistance Min assist for movement and support of LLE to EOB.  Transfers  Sit to Stand 4: Min guard;From bed;With upper extremity assist  Stand to Sit 4: Min guard;To chair/3-in-1;With upper extremity assist;With armrests  Details for Transfer Assistance VC's for hand placement on seated surface. Good control to descend to chair.  Ambulation/Gait  Ambulation/Gait Assistance 4: Min assist  Ambulation Distance (Feet) 5 Feet  Assistive device Rolling walker  Ambulation/Gait Assistance Details cue for posture and sequence with stand pivot from bed to recliner (5 feet).   Gait Pattern Step-to pattern;Antalgic  Gait velocity Decreased  Total Joint Exercises  Ankle Circles/Pumps AROM;Both;10 reps;Supine  Quad Sets AROM;Strengthening;Left;10 reps;Supine  Heel Slides AAROM;Strengthening;Left;10 reps;Supine  Hip ABduction/ADduction AAROM;Strengthening;Left;10 reps;Supine   Straight Leg Raises AAROM;Strengthening;Left;10 reps;Supine  PT - End of Session  Activity Tolerance Patient tolerated treatment well;Treatment limited secondary to medical complications (Comment)  Patient left in chair;with call bell/phone within reach;with family/visitor present  Nurse Communication Mobility status;Patient requests pain meds  PT - Assessment/Plan  PT Plan Current plan remains appropriate  PT Frequency 7X/week  Follow Up Recommendations Home health PT  PT equipment Rolling walker with 5" wheels;3in1 (PT)  PT Goal Progression  Progress towards PT goals Progressing toward goals  Acute Rehab PT Goals  PT Goal Formulation With patient/family  Time For Goal Achievement 07/09/13  Potential to Achieve Goals Good  PT General Charges  $$ ACUTE PT VISIT 1 Procedure  PT Treatments  $Therapeutic Exercise 8-22 mins  $Therapeutic Activity 8-22 mins

## 2013-07-03 NOTE — Progress Notes (Signed)
ANTICOAGULATION CONSULT NOTE - Initial Consult  Pharmacy Consult for Coumadin Indication: VTE prophylaxis s/p left TKA  No Known Allergies  Patient Measurements: Height: 5' 7.5" (171.5 cm) Weight: 231 lb 3.2 oz (104.872 kg) IBW/kg (Calculated) : 62.75  Vital Signs: Temp: 98.1 F (36.7 C) (11/22 0534) Temp src: Oral (11/22 0534) BP: 104/50 mmHg (11/22 0900) Pulse Rate: 64 (11/22 0700)  Pre-op Labs from 06/24/13   PT = 12.9 sec, INR 0.99.   Hgb 14.9, Platelet count 235K   Scr 0.72.    No LFTs  Estimated Creatinine Clearance: 96.3 ml/min (by C-G formula based on Cr of 0.8).   Medical History: Past Medical History  Diagnosis Date  . Diabetes mellitus   . Hypertension   . PONV (postoperative nausea and vomiting)    Assessment: 57 yo F s/p left TKA to begin Coumadin for VTE prophylaxis. Her INR today is subtherapeutic at 1.1. No bleeding issues noted. Coumadin score = 5. Of note, patient has also been started on ciprofloxacin which has the potential to increase warfarin levels.      Goal of Therapy:  INR 2-3 Monitor platelets by anticoagulation protocol: Yes   Plan:   Coumadin 7.5 mg again tonight.  Daily PT/INR.  F/U bleeding complications  Coumadin education materials ordered.  Coumadin education prior to discharge.  Linkin Vizzini C. Sennie Borden, PharmD Clinical Pharmacist-Resident Pager: [phone] Pharmacy: [phone] 07/03/2013 11:13 AM

## 2013-07-03 NOTE — Discharge Instructions (Signed)
Ice the knee constantly.  Do not prop anything under the knee.  Prop pillow under the ankle or calf only.  Dangle knee off bed or high chair to work on bending.  Ok to put full weight on the left knee.  Use brace at night to keep the knee straight. Use the CPM for at least 6 hours during each day in two hour increments.  Follow up in two weeks  Keep incision clean and dry for one week from surgery, then ok to shower and get wound wet. Cover until then.  Information on my medicine - Coumadin   (Warfarin)  This medication education was reviewed with me or my healthcare representative as part of my discharge preparation.  The pharmacist that spoke with me during my hospital stay was:  Ninetta Lights, [facility]  Why was Coumadin prescribed for you? Coumadin was prescribed for you because you have a blood clot or a medical condition that can cause an increased risk of forming blood clots. Blood clots can cause serious health problems by blocking the flow of blood to the heart, lung, or brain. Coumadin can prevent harmful blood clots from forming. As a reminder your indication for Coumadin is:   Blood Clot Prevention After Orthopedic Surgery  What test will check on my response to Coumadin? While on Coumadin (warfarin) you will need to have an INR test regularly to ensure that your dose is keeping you in the desired range. The INR (international normalized ratio) number is calculated from the result of the laboratory test called prothrombin time (PT).  If an INR APPOINTMENT HAS NOT ALREADY BEEN MADE FOR YOU please schedule an appointment to have this lab work done by your health care provider within 7 days. Your INR goal is usually a number between:  2 to 3 or your provider may give you a more narrow range like 2-2.5.  Ask your health care provider during an office visit what your goal INR is.  What  do you need to  know  About  COUMADIN? Take Coumadin (warfarin) exactly as prescribed by your  healthcare provider about the same time each day.  DO NOT stop taking without talking to the doctor who prescribed the medication.  Stopping without other blood clot prevention medication to take the place of Coumadin may increase your risk of developing a new clot or stroke.  Get refills before you run out.  What do you do if you miss a dose? If you miss a dose, take it as soon as you remember on the same day then continue your regularly scheduled regimen the next day.  Do not take two doses of Coumadin at the same time.  Important Safety Information A possible side effect of Coumadin (Warfarin) is an increased risk of bleeding. You should call your healthcare provider right away if you experience any of the following:   Bleeding from an injury or your nose that does not stop.   Unusual colored urine (red or dark brown) or unusual colored stools (red or black).   Unusual bruising for unknown reasons.   A serious fall or if you hit your head (even if there is no bleeding).  Some foods or medicines interact with Coumadin (warfarin) and might alter your response to warfarin. To help avoid this:   Eat a balanced diet, maintaining a consistent amount of Vitamin K.   Notify your provider about major diet changes you plan to make.   Avoid alcohol or limit your  intake to 1 drink for women and 2 drinks for men per day. (1 drink is 5 oz. wine, 12 oz. beer, or 1.5 oz. liquor.)  Make sure that ANY health care provider who prescribes medication for you knows that you are taking Coumadin (warfarin).  Also make sure the healthcare provider who is monitoring your Coumadin knows when you have started a new medication including herbals and non-prescription products.  Coumadin (Warfarin)  Major Drug Interactions  Increased Warfarin Effect Decreased Warfarin Effect  Alcohol (large quantities) Antibiotics (esp. Septra/Bactrim, Flagyl, Cipro) Amiodarone (Cordarone) Aspirin (ASA) Cimetidine  (Tagamet) Megestrol (Megace) NSAIDs (ibuprofen, naproxen, etc.) Piroxicam (Feldene) Propafenone (Rythmol SR) Propranolol (Inderal) Isoniazid (INH) Posaconazole (Noxafil) Barbiturates (Phenobarbital) Carbamazepine (Tegretol) Chlordiazepoxide (Librium) Cholestyramine (Questran) Griseofulvin Oral Contraceptives Rifampin Sucralfate (Carafate) Vitamin K   Coumadin (Warfarin) Major Herbal Interactions  Increased Warfarin Effect Decreased Warfarin Effect  Garlic Ginseng Ginkgo biloba Coenzyme Q10 Green tea St. John's wort    Coumadin (Warfarin) FOOD Interactions  Eat a consistent number of servings per week of foods HIGH in Vitamin K (1 serving =  cup)  Collards (cooked, or boiled & drained) Kale (cooked, or boiled & drained) Mustard greens (cooked, or boiled & drained) Parsley *serving size only =  cup Spinach (cooked, or boiled & drained) Swiss chard (cooked, or boiled & drained) Turnip greens (cooked, or boiled & drained)  Eat a consistent number of servings per week of foods MEDIUM-HIGH in Vitamin K (1 serving = 1 cup)  Asparagus (cooked, or boiled & drained) Broccoli (cooked, boiled & drained, or raw & chopped) Brussel sprouts (cooked, or boiled & drained) *serving size only =  cup Lettuce, raw (green leaf, endive, romaine) Spinach, raw Turnip greens, raw & chopped   These websites have more information on Coumadin (warfarin):  http://www.king-russell.com/; https://www.hines.net/;

## 2013-07-03 NOTE — Progress Notes (Signed)
OT Cancellation Note  Patient Details Name: Claudia [name] MRN: [id number] DOB: 04-20-1956   Cancelled Treatment:    Reason Eval/Treat Not Completed: Other (comment). Pt in too much pain, just now getting next pain meds. Did re-adjusted CPM which said was really hurting her and gave her a heat pack for her left thigh which said was cramping.  Evette Georges [phone] 07/03/2013, 3:35 PM

## 2013-07-03 NOTE — Progress Notes (Signed)
BP low 90's over 40-50's during the night.  BP 93/39 this am.  Patient denies lightheadedness.  AM labs pending.  On-call notified and order received for 500cc fluid bolus.  Continue to monitor.

## 2013-07-04 LAB — CBC
HCT: 34.4 % — ABNORMAL LOW (ref 36.0–46.0)
Hemoglobin: 11.5 g/dL — ABNORMAL LOW (ref 12.0–15.0)
MCH: 30 pg (ref 26.0–34.0)
MCHC: 33.4 g/dL (ref 30.0–36.0)
MCV: 89.8 fL (ref 78.0–100.0)
Platelets: 208 10*3/uL (ref 150–400)
RBC: 3.83 MIL/uL — ABNORMAL LOW (ref 3.87–5.11)
RDW: 15.1 % (ref 11.5–15.5)
WBC: 11.7 10*3/uL — ABNORMAL HIGH (ref 4.0–10.5)

## 2013-07-04 LAB — GLUCOSE, CAPILLARY
Glucose-Capillary: 180 mg/dL — ABNORMAL HIGH (ref 70–99)
Glucose-Capillary: 170 mg/dL — ABNORMAL HIGH (ref 70–99)
Glucose-Capillary: 98 mg/dL (ref 70–99)
Glucose-Capillary: 141 mg/dL — ABNORMAL HIGH (ref 70–99)
Glucose-Capillary: 134 mg/dL — ABNORMAL HIGH (ref 70–99)

## 2013-07-04 LAB — PROTIME-INR
INR: 1.33 (ref 0.00–1.49)
Prothrombin Time: 16.2 seconds — ABNORMAL HIGH (ref 11.6–15.2)

## 2013-07-04 MED ORDER — CIPROFLOXACIN HCL 500 MG PO TABS
500.0000 mg | ORAL_TABLET | Freq: Two times a day (BID) | ORAL | Status: DC
  Administered 2013-07-04 – 2013-07-05 (×2): 500 mg via ORAL
  Filled 2013-07-04 (×4): qty 1

## 2013-07-04 MED ORDER — SODIUM CHLORIDE 0.9 % IV BOLUS (SEPSIS)
500.0000 mL | Freq: Once | INTRAVENOUS | Status: AC
  Administered 2013-07-04: 500 mL via INTRAVENOUS

## 2013-07-04 MED ORDER — CALCIUM CARBONATE ANTACID 500 MG PO CHEW
1.0000 | CHEWABLE_TABLET | ORAL | Status: DC | PRN
Start: 2013-07-04 — End: 2013-07-06
  Filled 2013-07-04: qty 1

## 2013-07-04 MED ORDER — ALUM & MAG HYDROXIDE-SIMETH 200-200-20 MG/5ML PO SUSP
30.0000 mL | ORAL | Status: DC | PRN
Start: 2013-07-04 — End: 2013-07-06
  Administered 2013-07-04 – 2013-07-05 (×3): 30 mL via ORAL
  Filled 2013-07-04 (×3): qty 30

## 2013-07-04 MED ORDER — WARFARIN SODIUM 10 MG PO TABS
10.0000 mg | ORAL_TABLET | Freq: Once | ORAL | Status: AC
  Administered 2013-07-04: 10 mg via ORAL
  Filled 2013-07-04: qty 1

## 2013-07-04 NOTE — Progress Notes (Signed)
ANTICOAGULATION CONSULT NOTE - Follow-Up Consult  Pharmacy Consult for Coumadin Indication: VTE prophylaxis s/p left TKA  No Known Allergies  Patient Measurements: Height: 5' 7.5" (171.5 cm) Weight: 231 lb 3.2 oz (104.872 kg) IBW/kg (Calculated) : 62.75  Vital Signs: Temp: 97.8 [name] (36.6 C) (11/23 0604) Temp src: Oral (11/23 0604) BP: 94/43 mmHg (11/23 0604) Pulse Rate: 78 (11/23 0604)  Pre-op Labs from 06/24/13   PT = 12.9 sec, INR 0.99.   Hgb 14.9, Platelet count 235K   Scr 0.72.    No LFTs  Estimated Creatinine Clearance: 96.3 ml/min (by C-G formula based on Cr of 0.8).   Medical History: Past Medical History  Diagnosis Date  . Diabetes mellitus   . Hypertension   . PONV (postoperative nausea and vomiting)    Assessment: 57 yo [name] s/p left TKA to begin warfarin for VTE prophylaxis. Her INR today remains subtherapeutic at 1.33. No bleeding issues noted. Warfarin score = 5. Of note, patient has also been started on ciprofloxacin which has the potential to increase warfarin levels. However, patient has also resumed her diet which could potentially decrease warfarin levels.   Patient has been educated on warfarin, its indication, monitoring, and side effects. She verbalized understanding of this information, risk of bleeding, potential interactions, and all questions were answered.     Goal of Therapy:  INR 2-3 Monitor platelets by anticoagulation protocol: Yes   Plan:   Coumadin 10mg  tonight.  Daily PT/INR.  [name]/U bleeding complications   Alessandro Griep C. Javan Gonzaga, PharmD Clinical Pharmacist-Resident Pager: [phone] Pharmacy: [phone] 07/04/2013 8:30 AM

## 2013-07-04 NOTE — Progress Notes (Signed)
   Subjective: 2 Days Post-Op Procedure(s) (LRB): LEFT TOTAL KNEE ARTHROPLASTY (Left) Patient reports pain as mild and moderate.   Patient seen in rounds for Dr. Ranell Patrick. Patient is having problems with low pressures. Plan is to go rehab versus home after hospital stay.  Objective: Vital signs in last 24 hours: Temp:  [97.8 F (36.6 C)-98.6 F (37 C)] 97.8 F (36.6 C) (11/23 0604) Pulse Rate:  [71-82] 78 (11/23 0604) Resp:  [16-18] 18 (11/23 0604) BP: (94-111)/(41-45) 94/43 mmHg (11/23 0604) SpO2:  [95 %-100 %] 95 % (11/23 0604)  Intake/Output from previous day:  Intake/Output Summary (Last 24 hours) at 07/04/13 1312 Last data filed at 07/04/13 0605  Gross per 24 hour  Intake    360 ml  Output      0 ml  Net    360 ml    Intake/Output this shift:    Labs:  Recent Labs  07/03/13 0515 07/04/13 0416  HGB 11.8* 11.5*    Recent Labs  07/03/13 0515 07/04/13 0416  WBC 12.3* 11.7*  RBC 3.95 3.83*  HCT 35.4* 34.4*  PLT 210 208    Recent Labs  07/03/13 0515  NA 139  K 3.6  CL 103  CO2 27  BUN 8  CREATININE 0.80  GLUCOSE 201*  CALCIUM 8.5    Recent Labs  07/03/13 0515 07/04/13 0416  INR 1.10 1.33    EXAM General - Patient is Alert, Appropriate and Oriented Extremity - Neurovascular intact Sensation intact distally Dorsiflexion/Plantar flexion intact Dressing/Incision - clean, dry, no drainage, healing Motor Function - intact, moving foot and toes well on exam.   Past Medical History  Diagnosis Date  . Diabetes mellitus   . Hypertension   . PONV (postoperative nausea and vomiting)     Assessment/Plan: 2 Days Post-Op Procedure(s) (LRB): LEFT TOTAL KNEE ARTHROPLASTY (Left) Active Problems:   * No active hospital problems. *  Estimated body mass index is 35.66 kg/(m^2) as calculated from the following:   Height as of this encounter: 5' 7.5" (1.715 m).   Weight as of this encounter: 104.872 kg (231 lb 3.2 oz). Up with therapy  DVT  Prophylaxis - Coumadin Weight-Bearing as tolerated to left leg  Kennan Detter 07/04/2013, 1:12 PM

## 2013-07-04 NOTE — Progress Notes (Signed)
PHARMACY IV TO PO CONVERSION  This patient has been receiving ciprofloxacin IV. Based on criteria approved by the Pharmacy and Therapeutics Committee, this medication is being converted to the equivalent oral dose form. These criteria include:   . The patient is eating (either orally or per tube) and/or has been taking other orally administered medications for at least 24 hours.  . This patient has no evidence of active gastrointestinal bleeding or impaired GI absorption (gastrectomy, short bowel, patient on TNA or NPO).   If you have questions about this conversion, please contact the pharmacy department.   Thank you,  Margaret Cockerill C. Monte Bronder, PharmD Clinical Pharmacist-Resident Pager: [phone] Pharmacy: [phone] 07/04/2013 2:20 PM

## 2013-07-04 NOTE — Progress Notes (Signed)
Clinical Child psychotherapist (CSW) received consult for SNF placement. Per chart PT is recommending home health and Case Manager note states anticipated DC plan is home with home health services. Please reconsult if further social work needs arise. CSW signing off.   Jetta Lout, LCSWA Weekend CSW [phone]

## 2013-07-04 NOTE — Evaluation (Signed)
Occupational Therapy Evaluation Patient Details Name: Claudia [name] MRN: [id number] DOB: 08/08/1956 Today's Date: 07/04/2013 Time: 4098-1191 OT Time Calculation (min): 20 min  OT Assessment / Plan / Recommendation History of present illness Pt is a 57 y/o female admitted s/p L TKA on 07/02/13.   Clinical Impression   Pt presents with below problem list. Pt independent with ADLs, PTA. Pt will benefit from acute OT to increase independence prior to d/c.     OT Assessment  Patient needs continued OT Services    Follow Up Recommendations  Home health OT;Supervision - Intermittent    Barriers to Discharge      Equipment Recommendations  None recommended by OT    Recommendations for Other Services    Frequency  Min 2X/week    Precautions / Restrictions Precautions Precautions: Fall;Knee Precaution Comments: Reviewed precautions Required Braces or Orthoses: Knee Immobilizer - Left Knee Immobilizer - Left: On when out of bed or walking Restrictions Weight Bearing Restrictions: Yes LLE Weight Bearing: Weight bearing as tolerated   Pertinent Vitals/Pain Pain 8/10 in knee. Nurse brought pain meds. C/o heartburn.     ADL  Grooming: Set up Where Assessed - Grooming: Supported sitting Upper Body Bathing: Set up Where Assessed - Upper Body Bathing: Supported sitting Lower Body Bathing: Min guard Where Assessed - Lower Body Bathing: Supported sit to stand Upper Body Dressing: Set up Where Assessed - Upper Body Dressing: Supported sitting Lower Body Dressing: Min guard Where Assessed - Lower Body Dressing: Supported sit to Pharmacist, hospital: Minimal Dentist Method: Sit to Barista: Comfort height toilet;Grab bars Toileting - Architect and Hygiene: Min guard Where Assessed - Engineer, mining and Hygiene: Standing;Sit on 3-in-1 or toilet (clothing-standing and hygiene-sitting) Tub/Shower Transfer Method:  Not assessed Equipment Used: Gait belt;Rolling walker;Knee Immobilizer Transfers/Ambulation Related to ADLs: Min guard for ambulation. Min guard/Min A for transfers. ADL Comments: Educated that reaching down to don/doff sock on left foot is beneficial in that it increases ROM in knee. Cues to try to keep knee straight. OT demonstrated shower transfer. Educated on dressing technique as well as use of bag on walker to carry items. Educated to have family pick up rugs in house (non skid in bathroom).   Recommended using 3 in 1 over toilet as pt states she has been getting on/off of it okay-she required Min A to stand from comfort height toilet.    OT Diagnosis: Acute pain  OT Problem List: Decreased activity tolerance;Decreased knowledge of use of DME or AE;Decreased knowledge of precautions;Pain;Decreased range of motion;Decreased strength OT Treatment Interventions: Self-care/ADL training;DME and/or AE instruction;Therapeutic activities;Patient/family education;Balance training   OT Goals(Current goals can be found in the care plan section) Acute Rehab OT Goals Patient Stated Goal: Go home OT Goal Formulation: With patient Time For Goal Achievement: 07/11/13 Potential to Achieve Goals: Good ADL Goals Pt Will Perform Lower Body Bathing: with modified independence;sit to/from stand Pt Will Perform Lower Body Dressing: with modified independence;sit to/from stand Pt Will Transfer to Toilet: with modified independence;ambulating (3 in 1 over commode) Pt Will Perform Toileting - Clothing Manipulation and hygiene: with modified independence;sit to/from stand Pt Will Perform Tub/Shower Transfer: Shower transfer;with supervision;ambulating;shower seat;rolling walker  Visit Information  Last OT Received On: 07/04/13 Assistance Needed: +1 History of Present Illness: Pt is a 57 y/o female admitted s/p L TKA on 07/02/13.       Prior Functioning     Home Living Family/patient expects to be  discharged to:: Private residence Living Arrangements: Alone Available Help at Discharge: Family;Available PRN/intermittently Type of Home: House Home Access: Stairs to enter [employer] of Steps: 4 Entrance Stairs-Rails: Can reach both Home Layout: One level Home Equipment: Grab bars - toilet;Grab bars - tub/shower;Toilet riser;Shower seat;Bedside commode Prior Function Level of Independence: Independent Communication Communication: No difficulties Dominant Hand: Right         Vision/Perception     Cognition  Cognition Arousal/Alertness: Awake/alert Behavior During Therapy: WFL for tasks assessed/performed Overall Cognitive Status: Within Functional Limits for tasks assessed    Extremity/Trunk Assessment Upper Extremity Assessment Upper Extremity Assessment: Overall WFL for tasks assessed Lower Extremity Assessment Lower Extremity Assessment: Defer to PT evaluation     Mobility Bed Mobility Bed Mobility: Supine to Sit;Sitting - Scoot to Edge of Bed Supine to Sit: 4: Min guard Sitting - Scoot to [place] of Bed: 4: Min guard Details for Bed Mobility Assistance: Educated that she could hook foot around LLE to help move it off bed. Pt using rails. Transfers Transfers: Sit to Stand;Stand to Sit Sit to Stand: 4: Min guard;4: Min assist;With upper extremity assist;From toilet;From bed;From chair/3-in-1 Stand to Sit: 4: Min guard;With upper extremity assist;To chair/3-in-1;To toilet Details for Transfer Assistance: Min A for sit to stand from comfort height toilet. Cues for technique.     Exercise     Balance     End of Session OT - End of Session Equipment Utilized During Treatment: Gait belt;Rolling walker;Left knee immobilizer Activity Tolerance: Patient tolerated treatment well Patient left: in chair;with call bell/phone within reach;with family/visitor present  GO     Earlie Raveling OTR/L [phone] 07/04/2013, 10:24 AM

## 2013-07-04 NOTE — Progress Notes (Signed)
Physical Therapy Treatment Patient Details Name: Claudia [name] MRN: [id number] DOB: 08-17-1955 Today's Date: 07/04/2013 Time: 5784-6962 PT Time Calculation (min): 26 min  PT Assessment / Plan / Recommendation  History of Present Illness Pt is a 57 y/o female admitted s/p L TKA on 07/02/13.   PT Comments   Pt making great progress with mobility today. No dizziness reported with mobility. Did report nausea/heartburn feeling. RN notified and addressing.   Follow Up Recommendations  Home health PT     Equipment Recommendations  Rolling walker with 5" wheels;3in1 (PT)    Frequency 7X/week   Progress towards PT Goals Progress towards PT goals: Progressing toward goals  Plan Current plan remains appropriate    Precautions / Restrictions Precautions Precautions: Fall;Knee Required Braces or Orthoses: Knee Immobilizer - Left Knee Immobilizer - Left: On when out of bed or walking Restrictions LLE Weight Bearing: Weight bearing as tolerated       Mobility  Bed Mobility Bed Mobility: Sit to Supine Sit to Supine: 4: Min guard;HOB flat Details for Bed Mobility Assistance: pt able to use right LE behind the left LE to lift legs onto bed and then use arms to move up in bed/over in bed as needed with bed flat and no rails used. Transfers Sit to Stand: 5: Supervision;4: Min guard;From chair/3-in-1;From toilet;With upper extremity assist Stand to Sit: 4: Min guard;To toilet;To bed;With upper extremity assist Details for Transfer Assistance: cues for hand placement x1 with transfers. pt independent with self care in bathroom after using toilet and supervision to wash hands at sink (no UE support for balance). Ambulation/Gait Ambulation/Gait Assistance: 4: Min guard Ambulation Distance (Feet): 40 Feet (plus an additional 71feet from chair to bathroom before room/hall gait) Assistive device: Rolling walker Ambulation/Gait Assistance Details: min cues for posture and sequence/walker position  with gait Gait Pattern: Step-through pattern;Antalgic Gait velocity: Decreased    Exercises Total Joint Exercises Ankle Circles/Pumps: AROM;Strengthening;Both;10 reps;Seated Quad Sets: AROM;Strengthening;Left;10 reps;Seated Heel Slides: AAROM;Strengthening;Left;10 reps;Seated Hip ABduction/ADduction: AAROM;Strengthening;Left;10 reps;Seated Straight Leg Raises: AAROM;Strengthening;Left;10 reps;Seated      PT Goals (current goals can now be found in the care plan section) Acute Rehab PT Goals Patient Stated Goal: Go home PT Goal Formulation: With patient/family Time For Goal Achievement: 07/09/13 Potential to Achieve Goals: Good  Visit Information  Last PT Received On: 07/04/13 Assistance Needed: +1 History of Present Illness: Pt is a 57 y/o female admitted s/p L TKA on 07/02/13.    Subjective Data  Patient Stated Goal: Go home   Cognition  Cognition Arousal/Alertness: Awake/alert Behavior During Therapy: WFL for tasks assessed/performed Overall Cognitive Status: Within Functional Limits for tasks assessed       End of Session PT - End of Session Equipment Utilized During Treatment: Gait belt;Left knee immobilizer Activity Tolerance: Patient tolerated treatment well Patient left: in bed;with family/visitor present;with call bell/phone within reach Nurse Communication: Mobility status;Patient requests pain meds   GP     Sallyanne Kuster 07/04/2013, 2:27 PM Sallyanne Kuster, PTA Office- [phone]

## 2013-07-05 ENCOUNTER — Encounter (HOSPITAL_COMMUNITY): Payer: Self-pay | Admitting: General Practice

## 2013-07-05 LAB — GLUCOSE, CAPILLARY
Glucose-Capillary: 130 mg/dL — ABNORMAL HIGH (ref 70–99)
Glucose-Capillary: 137 mg/dL — ABNORMAL HIGH (ref 70–99)
Glucose-Capillary: 132 mg/dL — ABNORMAL HIGH (ref 70–99)
Glucose-Capillary: 109 mg/dL — ABNORMAL HIGH (ref 70–99)

## 2013-07-05 LAB — CBC
HCT: 31.7 % — ABNORMAL LOW (ref 36.0–46.0)
Hemoglobin: 10.7 g/dL — ABNORMAL LOW (ref 12.0–15.0)
MCH: 30.3 pg (ref 26.0–34.0)
MCHC: 33.8 g/dL (ref 30.0–36.0)
MCV: 89.8 fL (ref 78.0–100.0)
Platelets: 191 10*3/uL (ref 150–400)
RBC: 3.53 MIL/uL — ABNORMAL LOW (ref 3.87–5.11)
RDW: 15.1 % (ref 11.5–15.5)
WBC: 9.8 10*3/uL (ref 4.0–10.5)

## 2013-07-05 LAB — BASIC METABOLIC PANEL
BUN: 6 mg/dL (ref 6–23)
CO2: 28 mEq/L (ref 19–32)
Calcium: 8.5 mg/dL (ref 8.4–10.5)
Chloride: 102 mEq/L (ref 96–112)
Creatinine, Ser: 0.86 mg/dL (ref 0.50–1.10)
GFR calc Af Amer: 85 mL/min — ABNORMAL LOW (ref >90)
GFR calc non Af Amer: 73 mL/min — ABNORMAL LOW (ref >90)
Glucose, Bld: 120 mg/dL — ABNORMAL HIGH (ref 70–99)
Potassium: 3.7 mEq/L (ref 3.5–5.1)
Sodium: 137 mEq/L (ref 135–145)

## 2013-07-05 LAB — PROTIME-INR
INR: 1.64 — ABNORMAL HIGH (ref 0.00–1.49)
Prothrombin Time: 19 seconds — ABNORMAL HIGH (ref 11.6–15.2)

## 2013-07-05 MED ORDER — WARFARIN SODIUM 10 MG PO TABS
10.0000 mg | ORAL_TABLET | Freq: Once | ORAL | Status: AC
  Administered 2013-07-05: 10 mg via ORAL
  Filled 2013-07-05: qty 1

## 2013-07-05 NOTE — Progress Notes (Signed)
Patient c/o feeling light headed this morning. Her Bp improved to 118/54 and her Hbg is 10.7. Patient advised to move slowly when transferring from chair to standing. Will continue to monitor.

## 2013-07-05 NOTE — Progress Notes (Signed)
07/05/13 Inpatient rehab reviewed patient, not appropriate for inpatient rehab, PT/OT recommending HHPT and HHOT. Will add HHRN for INR monitoring for coumadin. Contacted Der Gagliano with [facility] and set up [facility] and HHRN in addition to HHPT.Will continue to follow for d/c needs. Jacquelynn Cree RN, BSN, CCM

## 2013-07-05 NOTE — Progress Notes (Signed)
Physical Therapy Treatment Patient Details Name: Claudia [name] MRN: [id number] DOB: 01/05/1956 Today's Date: 07/05/2013 Time: 4098-1191 PT Time Calculation (min): 25 min  PT Assessment / Plan / Recommendation  History of Present Illness Pt is a 57 y/o female admitted s/p L TKA on 07/02/13.   PT Comments   Patient continues to make slow gains with mobility and gait.  Patient limited today by nausea and pain.  Unable to attempt stairs today.  Follow Up Recommendations  Home health PT     Does the patient have the potential to tolerate intense rehabilitation     Barriers to Discharge        Equipment Recommendations  Rolling walker with 5" wheels;3in1 (PT)    Recommendations for Other Services    Frequency 7X/week   Progress towards PT Goals Progress towards PT goals: Progressing toward goals  Plan Current plan remains appropriate    Precautions / Restrictions Precautions Precautions: Fall;Knee Required Braces or Orthoses: Knee Immobilizer - Left Knee Immobilizer - Left: On when out of bed or walking Restrictions Weight Bearing Restrictions: Yes LLE Weight Bearing: Weight bearing as tolerated   Pertinent Vitals/Pain     Mobility  Bed Mobility Bed Mobility: Not assessed Transfers Transfers: Sit to Stand;Stand to Sit Sit to Stand: 5: Supervision;With upper extremity assist;With armrests;From chair/3-in-1 Stand to Sit: 5: Supervision;With upper extremity assist;With armrests;To chair/3-in-1 Details for Transfer Assistance: Cues for hand placement.   Ambulation/Gait Ambulation/Gait Assistance: 4: Min guard Ambulation Distance (Feet): 62 Feet Assistive device: Rolling walker Ambulation/Gait Assistance Details: Verbal cues for safe use of RW - patient lifting RW between steps.  Cues to roll walker. Gait Pattern: Step-through pattern;Antalgic Gait velocity: Decreased    Exercises Total Joint Exercises Ankle Circles/Pumps: AROM;Both;10 reps;Seated Quad Sets:  AROM;Left;10 reps;Seated Short Arc Quad: AROM;Left;10 reps;Seated Heel Slides: AROM;Left;10 reps;Seated Hip ABduction/ADduction: AROM;Left;10 reps;Seated Long Arc Quad: AROM;Left;10 reps;Seated Knee Flexion: AROM;Left;10 reps;Seated Goniometric ROM: -10 degrees extension; 60 degrees flexion     PT Goals (current goals can now be found in the care plan section)    Visit Information  Last PT Received On: 07/05/13 Assistance Needed: +1 History of Present Illness: Pt is a 57 y/o female admitted s/p L TKA on 07/02/13.    Subjective Data  Subjective: "I'll have help at home on Wednesday."   "I've been nauseated all day"   Cognition  Cognition Arousal/Alertness: Awake/alert Behavior During Therapy: [facility] for tasks assessed/performed Overall Cognitive Status: Within Functional Limits for tasks assessed    Balance     End of Session PT - End of Session Equipment Utilized During Treatment: Gait belt;Left knee immobilizer Activity Tolerance: Patient limited by fatigue;Patient limited by pain Patient left: in chair;with call bell/phone within reach;with family/visitor present Nurse Communication: Mobility status;Patient requests pain meds   GP     Vena Austria 07/05/2013, 6:41 PM Durenda Hurt. Renaldo Fiddler, [facility] Acute Rehab Services Pager [phone]

## 2013-07-05 NOTE — Progress Notes (Signed)
Orthopedic Tech Progress Note Patient Details:  Claudia [name] Mar 26, 1956 [id number] On cpm at 7:40 pm LLE 0-60 Patient ID: Claudia [name], female   DOB: 1956/07/25, 57 y.o.   MRN: [id number]   Claudia [name] 07/05/2013, 7:43 PM

## 2013-07-05 NOTE — Progress Notes (Signed)
Physical medicine and rehabilitation consult was requested with chart reviewed. Patient status post elective left total knee replacement. Patient does not meet medical necessity for inpatient rehabilitation services. Physical therapy evaluation completed 07/02/2013 with recommendations of home health therapies. Patient required minimal assist ambulation after initial evaluation. Will hold on formal rehabilitation consult at this time and recommendations for home health therapies.

## 2013-07-05 NOTE — Progress Notes (Signed)
Orthopedics Progress Note  Notation for administration of Ciprofloxacin: Patient noted during the induction of anesthesia and placement of foley catheter on 07/02/2013 to have ery cloudy urine with significant sediment present.  I ordered a Urinalysis and Urine culture to be obtained and ordered Cipro to cover post operatively for a suspected UTI, especially given the placement of implants in her knee.   Objective:  Filed Vitals:   07/05/13 1316  BP: 95/40  Pulse: 83  Temp: 98.5 F (36.9 C)  Resp: 18    General:   Lab Results  Component Value Date   WBC 9.8 07/05/2013   HGB 10.7* 07/05/2013   HCT 31.7* 07/05/2013   MCV 89.8 07/05/2013   PLT 191 07/05/2013       Component Value Date/Time   NA 137 07/05/2013 0253   K 3.7 07/05/2013 0253   CL 102 07/05/2013 0253   CO2 28 07/05/2013 0253   GLUCOSE 120* 07/05/2013 0253   BUN 6 07/05/2013 0253   CREATININE 0.86 07/05/2013 0253   CALCIUM 8.5 07/05/2013 0253   GFRNONAA 73* 07/05/2013 0253   GFRAA 85* 07/05/2013 0253    Lab Results  Component Value Date   INR 1.64* 07/05/2013   INR 1.33 07/04/2013   INR 1.10 07/03/2013    Assessment/Plan: Urine culture no growth at 72 hours, U/A results not found. Will d/c antibiotics. S/p LEFT TOTAL KNEE ARTHROPLASTY on 07/02/13  Almedia Balls. Ranell Patrick, MD 07/05/2013 1:23 PM

## 2013-07-05 NOTE — Progress Notes (Signed)
   Subjective: 3 Days Post-Op Procedure(s) (LRB): LEFT TOTAL KNEE ARTHROPLASTY (Left)  Pt c/o issues with her blood pressure fluctuating overnight and nausea this morning Plan for d/c either tomorrow or Wednesday depending on progress Patient reports pain as mild.  Objective:   VITALS:   Filed Vitals:   07/05/13 0813  BP: 118/54  Pulse:   Temp:   Resp:     Left knee incision healing well nv intact distally No rashes or edema  LABS  Recent Labs  07/03/13 0515 07/04/13 0416 07/05/13 0253  HGB 11.8* 11.5* 10.7*  HCT 35.4* 34.4* 31.7*  WBC 12.3* 11.7* 9.8  PLT 210 208 191     Recent Labs  07/03/13 0515 07/05/13 0253  NA 139 137  K 3.6 3.7  BUN 8 6  CREATININE 0.80 0.86  GLUCOSE 201* 120*     Assessment/Plan: 3 Days Post-Op Procedure(s) (LRB): LEFT TOTAL KNEE ARTHROPLASTY (Left) Continue PT/OT Plan for d/c either tomorrow or Wednesday home Pt in agreement     [employer], MPAS, PA-C  07/05/2013, 10:34 AM

## 2013-07-05 NOTE — Progress Notes (Signed)
ANTICOAGULATION CONSULT NOTE - Follow-Up Consult  Pharmacy Consult for Coumadin Indication: VTE prophylaxis s/p left TKA  No Known Allergies  Patient Measurements: Height: 5' 7.5" (171.5 cm) Weight: 231 lb 3.2 oz (104.872 kg) IBW/kg (Calculated) : 62.75  Vital Signs: Temp: 98.7 F (37.1 C) (11/24 0546) Temp src: Oral (11/24 0546) BP: 118/54 mmHg (11/24 0813) Pulse Rate: 78 (11/24 0546)  Pre-op Labs from 06/24/13   PT = 12.9 sec, INR 0.99.   Hgb 14.9, Platelet count 235K   Scr 0.72.    No LFTs  Estimated Creatinine Clearance: 89.6 ml/min (by C-G formula based on Cr of 0.86).   Medical History: Past Medical History  Diagnosis Date  . Diabetes mellitus   . Hypertension   . PONV (postoperative nausea and vomiting)   . Arthritis    Assessment: 57 yo F s/p left TKA to begin warfarin for VTE prophylaxis. Her INR today remains subtherapeutic but has trended up nicely to 1.64. Pt experienced some nausea this morning. F/u on diet trends. No bleeding issues noted. Warfarin score = 5. H/H 10.7/31.7, Plt 191     Goal of Therapy:  INR 2-3 Monitor platelets by anticoagulation protocol: Yes   Plan:   Coumadin 10mg  tonight.  Daily PT/INR.  F/U bleeding complications  Vinnie Level, PharmD.  Clinical Pharmacist Pager [phone]

## 2013-07-05 NOTE — Progress Notes (Signed)
Orthopedics Progress Note  Subjective: Patient feeling better this afternoon.  She had nausea earlier which pushed her PT back.  They are returning this evening.  Her pain is well controlled.  She has not negotiated stairs/steps yet.  Objective:  Filed Vitals:   07/05/13 1521  BP:   Pulse:   Temp:   Resp: 18    General: Awake and alert  Musculoskeletal: dressing changed, no erythema, min swelling, TED hose not on Neurovascularly intact  Lab Results  Component Value Date   WBC 9.8 07/05/2013   HGB 10.7* 07/05/2013   HCT 31.7* 07/05/2013   MCV 89.8 07/05/2013   PLT 191 07/05/2013       Component Value Date/Time   NA 137 07/05/2013 0253   K 3.7 07/05/2013 0253   CL 102 07/05/2013 0253   CO2 28 07/05/2013 0253   GLUCOSE 120* 07/05/2013 0253   BUN 6 07/05/2013 0253   CREATININE 0.86 07/05/2013 0253   CALCIUM 8.5 07/05/2013 0253   GFRNONAA 73* 07/05/2013 0253   GFRAA 85* 07/05/2013 0253    Lab Results  Component Value Date   INR 1.64* 07/05/2013   INR 1.33 07/04/2013   INR 1.10 07/03/2013    Assessment/Plan: POD #3 s/p Procedure(s): LEFT TOTAL KNEE ARTHROPLASTY  Still needing some PT after N/V today.  Needs TED hose on.  Care order placed.  SCDs on as well.  INR is coming up now.  D/C tomorrow if PT agrees and D/C criteria met. CIR not appropriate per rehab.  Almedia Balls. Ranell Patrick, MD 07/05/2013 5:11 PM

## 2013-07-05 NOTE — Progress Notes (Signed)
Occupational Therapy Treatment Patient Details Name: Claudia [name] MRN: [id number] DOB: [birth year] Today's Date: 07/05/2013 Time: 4098-1191 OT Time Calculation (min): 23 min  OT Assessment / Plan / Recommendation  History of present illness  S/P: L TKA   OT comments  Pt. Moving well.  S for all components of toileting and doing great with bed mobility.  States she will have family assistance at d/c starting wed.   Follow Up Recommendations  Home health OT;Supervision - Intermittent           Equipment Recommendations  None recommended by OT        Frequency Min 2X/week   Progress towards OT Goals Progress towards OT goals: Progressing toward goals  Plan Discharge plan remains appropriate    Precautions / Restrictions Precautions Precautions: Fall;Knee Required Braces or Orthoses: Knee Immobilizer - Left Knee Immobilizer - Left: On when out of bed or walking Restrictions LLE Weight Bearing: Weight bearing as tolerated   Pertinent Vitals/Pain 8/10, pain meds given prior to arrival by r.n.    ADL  Grooming: Performed;Wash/dry hands;Supervision/safety Where Assessed - Grooming: Supported standing Upper Body Dressing: Simulated Lower Body Dressing: Simulated;Supervision/safety Where Assessed - Lower Body Dressing: Supported sitting Toilet Transfer: Performed;Min guard Statistician Method: Sit to Barista: Comfort height toilet;Grab bars Toileting - Architect and Hygiene: Performed;Supervision/safety Where Assessed - Engineer, mining and Hygiene: Standing Tub/Shower Transfer: Information systems manager: Walk in shower Equipment Used: Knee Immobilizer;Rolling walker Transfers/Ambulation Related to ADLs: min guard for amb. and transfers ADL Comments: able to complete all aspects of toileting with s/min guard a, sim. don/doff sock with s. states KI is limiting her from reaching but would  be able to once KI off.       OT Goals(current goals can now be found in the care plan section)    Visit Information  Last OT Received On: 07/05/13    Subjective Data   "i think i am just gonna do light baths for now until i get this off and can stand on it good" (in reference th KI limiting ability to clear shower stall ledge)   Prior Functioning  Home Living Family/patient expects to be discharged to:: Private residence Living Arrangements: Alone    Cognition  Cognition Arousal/Alertness: Awake/alert Behavior During Therapy: WFL for tasks assessed/performed Overall Cognitive Status: Within Functional Limits for tasks assessed    Mobility  Bed Mobility Details for Bed Mobility Assistance: s all aspects with hob flat and no use of rails.  pt. able to guide LLE out of bed with out cueing.   Transfers Transfers: Sit to Stand;Stand to Sit Sit to Stand: 5: Supervision;From bed Stand to Sit: 5: Supervision;With armrests;To chair/3-in-1               End of Session OT - End of Session Equipment Utilized During Treatment: Rolling walker;Left knee immobilizer Activity Tolerance: Patient tolerated treatment well Patient left: in chair;with call bell/phone within reach;with family/visitor present       Robet Leu, COTA/L 07/05/2013, 7:57 AM

## 2013-07-06 ENCOUNTER — Encounter (HOSPITAL_COMMUNITY): Payer: Self-pay | Admitting: Orthopedic Surgery

## 2013-07-06 LAB — PROTIME-INR
INR: 2.52 — ABNORMAL HIGH (ref 0.00–1.49)
Prothrombin Time: 26.3 seconds — ABNORMAL HIGH (ref 11.6–15.2)

## 2013-07-06 LAB — GLUCOSE, CAPILLARY: Glucose-Capillary: 150 mg/dL — ABNORMAL HIGH (ref 70–99)

## 2013-07-06 MED ORDER — WARFARIN SODIUM 5 MG PO TABS: 5.0000 mg | ORAL_TABLET | Freq: Every day | ORAL | Status: DC

## 2013-07-06 MED ORDER — METHOCARBAMOL 500 MG PO TABS: 500.0000 mg | ORAL_TABLET | Freq: Four times a day (QID) | ORAL | Status: DC | PRN

## 2013-07-06 MED ORDER — OXYCODONE HCL 5 MG PO TABS: 5.0000 mg | ORAL_TABLET | ORAL | Status: DC | PRN

## 2013-07-06 NOTE — Progress Notes (Signed)
   Subjective: 4 Days Post-Op Procedure(s) (LRB): LEFT TOTAL KNEE ARTHROPLASTY (Left)  Pt doing well this morning No nausea and minimal pain Ready for d/c today Patient reports pain as mild.  Objective:   VITALS:   Filed Vitals:   07/06/13 0529  BP: 104/47  Pulse:   Temp:   Resp:     Left knee incision healing well nv intact distally No rashes or edema  LABS  Recent Labs  07/04/13 0416 07/05/13 0253  HGB 11.5* 10.7*  HCT 34.4* 31.7*  WBC 11.7* 9.8  PLT 208 191     Recent Labs  07/05/13 0253  NA 137  K 3.7  BUN 6  CREATININE 0.86  GLUCOSE 120*     Assessment/Plan: 4 Days Post-Op Procedure(s) (LRB): LEFT TOTAL KNEE ARTHROPLASTY (Left) Doing well D/c home today F/u in 2 weeks     Alphonsa Overall, MPAS, PA-C  07/06/2013, 7:20 AM

## 2013-07-06 NOTE — Discharge Summary (Signed)
Physician Discharge Summary   Patient ID: Claudia [name] MRN: [id number] DOB/AGE: 58/30/1957 58 y.o.  Admit date: 07/02/2013 Discharge date: 07/06/2013  Admission Diagnoses:  Left knee end stage osteoarthritis  Discharge Diagnoses:  Same   Surgeries: Procedure(s): LEFT TOTAL KNEE ARTHROPLASTY on 07/02/2013   Consultants: PT/OT, Inpatient rehab  Discharged Condition: Stable  Hospital Course: Claudia [name] is an 57 y.o. female who was admitted 07/02/2013 with a chief complaint of No chief complaint on file. , and found to have a diagnosis of <principal problem not specified>.  They were brought to the operating room on 07/02/2013 and underwent the above named procedures.    The patient had an uncomplicated hospital course and was stable for discharge.  Recent vital signs:  Filed Vitals:   07/06/13 0529  BP: 104/47  Pulse:   Temp:   Resp:     Recent laboratory studies:  Results for orders placed during the hospital encounter of 07/02/13  URINE CULTURE      Result Value Range   Specimen Description URINE, CATHETERIZED     Special Requests NONE     Culture  Setup Time       Value: 07/02/2013 10:38     Performed at [facility] Count       Value: NO GROWTH     Performed at [facility]   Culture       Value: NO GROWTH     Performed at [facility]   Report Status 07/03/2013 FINAL    GLUCOSE, CAPILLARY      Result Value Range   Glucose-Capillary 187 (*) 70 - 99 mg/dL  GLUCOSE, CAPILLARY      Result Value Range   Glucose-Capillary 188 (*) 70 - 99 mg/dL  GLUCOSE, CAPILLARY      Result Value Range   Glucose-Capillary 243 (*) 70 - 99 mg/dL  PROTIME-INR      Result Value Range   Prothrombin Time 14.0  11.6 - 15.2 seconds   INR 1.10  0.00 - 1.49  CBC      Result Value Range   WBC 12.3 (*) 4.0 - 10.5 K/uL   RBC 3.95  3.87 - 5.11 MIL/uL   Hemoglobin 11.8 (*) 12.0 - 15.0 g/dL   HCT 40.9 (*) 81.1 - 91.4 %   MCV 89.6  78.0 -  100.0 fL   MCH 29.9  26.0 - 34.0 pg   MCHC 33.3  30.0 - 36.0 g/dL   RDW 78.2  95.6 - 21.3 %   Platelets 210  150 - 400 K/uL  BASIC METABOLIC PANEL      Result Value Range   Sodium 139  135 - 145 mEq/L   Potassium 3.6  3.5 - 5.1 mEq/L   Chloride 103  96 - 112 mEq/L   CO2 27  19 - 32 mEq/L   Glucose, Bld 201 (*) 70 - 99 mg/dL   BUN 8  6 - 23 mg/dL   Creatinine, Ser 0.86  0.50 - 1.10 mg/dL   Calcium 8.5  8.4 - 57.8 mg/dL   GFR calc non Af Amer 80 (*) >90 mL/min   GFR calc Af Amer >90  >90 mL/min  GLUCOSE, CAPILLARY      Result Value Range   Glucose-Capillary 204 (*) 70 - 99 mg/dL  GLUCOSE, CAPILLARY      Result Value Range   Glucose-Capillary 219 (*) 70 - 99 mg/dL  GLUCOSE, CAPILLARY  Result Value Range   Glucose-Capillary 191 (*) 70 - 99 mg/dL  GLUCOSE, CAPILLARY      Result Value Range   Glucose-Capillary 224 (*) 70 - 99 mg/dL  PROTIME-INR      Result Value Range   Prothrombin Time 16.2 (*) 11.6 - 15.2 seconds   INR 1.33  0.00 - 1.49  CBC      Result Value Range   WBC 11.7 (*) 4.0 - 10.5 K/uL   RBC 3.83 (*) 3.87 - 5.11 MIL/uL   Hemoglobin 11.5 (*) 12.0 - 15.0 g/dL   HCT 29.5 (*) 28.4 - 13.2 %   MCV 89.8  78.0 - 100.0 fL   MCH 30.0  26.0 - 34.0 pg   MCHC 33.4  30.0 - 36.0 g/dL   RDW 44.0  10.2 - 72.5 %   Platelets 208  150 - 400 K/uL  GLUCOSE, CAPILLARY      Result Value Range   Glucose-Capillary 171 (*) 70 - 99 mg/dL  GLUCOSE, CAPILLARY      Result Value Range   Glucose-Capillary 175 (*) 70 - 99 mg/dL  GLUCOSE, CAPILLARY      Result Value Range   Glucose-Capillary 180 (*) 70 - 99 mg/dL  GLUCOSE, CAPILLARY      Result Value Range   Glucose-Capillary 170 (*) 70 - 99 mg/dL   Comment 1 Notify RN     Comment 2 Documented in Chart    GLUCOSE, CAPILLARY      Result Value Range   Glucose-Capillary 98  70 - 99 mg/dL  PROTIME-INR      Result Value Range   Prothrombin Time 19.0 (*) 11.6 - 15.2 seconds   INR 1.64 (*) 0.00 - 1.49  CBC      Result Value Range    WBC 9.8  4.0 - 10.5 K/uL   RBC 3.53 (*) 3.87 - 5.11 MIL/uL   Hemoglobin 10.7 (*) 12.0 - 15.0 g/dL   HCT 36.6 (*) 44.0 - 34.7 %   MCV 89.8  78.0 - 100.0 fL   MCH 30.3  26.0 - 34.0 pg   MCHC 33.8  30.0 - 36.0 g/dL   RDW 42.5  95.6 - 38.7 %   Platelets 191  150 - 400 K/uL  BASIC METABOLIC PANEL      Result Value Range   Sodium 137  135 - 145 mEq/L   Potassium 3.7  3.5 - 5.1 mEq/L   Chloride 102  96 - 112 mEq/L   CO2 28  19 - 32 mEq/L   Glucose, Bld 120 (*) 70 - 99 mg/dL   BUN 6  6 - 23 mg/dL   Creatinine, Ser 5.64  0.50 - 1.10 mg/dL   Calcium 8.5  8.4 - 33.2 mg/dL   GFR calc non Af Amer 73 (*) >90 mL/min   GFR calc Af Amer 85 (*) >90 mL/min  GLUCOSE, CAPILLARY      Result Value Range   Glucose-Capillary 141 (*) 70 - 99 mg/dL  GLUCOSE, CAPILLARY      Result Value Range   Glucose-Capillary 134 (*) 70 - 99 mg/dL  GLUCOSE, CAPILLARY      Result Value Range   Glucose-Capillary 130 (*) 70 - 99 mg/dL  GLUCOSE, CAPILLARY      Result Value Range   Glucose-Capillary 137 (*) 70 - 99 mg/dL   Comment 1 Documented in Chart     Comment 2 Notify RN    GLUCOSE, CAPILLARY  Result Value Range   Glucose-Capillary 132 (*) 70 - 99 mg/dL  GLUCOSE, CAPILLARY      Result Value Range   Glucose-Capillary 109 (*) 70 - 99 mg/dL  GLUCOSE, CAPILLARY      Result Value Range   Glucose-Capillary 150 (*) 70 - 99 mg/dL  ABO/RH      Result Value Range   ABO/RH(D) B POS      Discharge Medications:     Medication List    ASK your doctor about these medications       diclofenac sodium 1 % Gel  Commonly known as:  VOLTAREN  Apply 2 g topically 2 (two) times daily.     FARXIGA 5 MG Tabs  Generic drug:  Dapagliflozin Propanediol  Take 5 mg by mouth daily before breakfast.     glipiZIDE 10 MG tablet  Commonly known as:  GLUCOTROL  Take 10 mg by mouth 2 (two) times daily before a meal.     HYDROcodone-acetaminophen 5-325 MG per tablet  Commonly known as:  NORCO/VICODIN  Take 1 tablet by  mouth every 4 (four) hours as needed for moderate pain.     lisinopril 10 MG tablet  Commonly known as:  PRINIVIL,ZESTRIL  Take 10 mg by mouth daily.     metFORMIN 500 MG tablet  Commonly known as:  GLUCOPHAGE  Take 500 mg by mouth 2 (two) times daily with a meal.     sitaGLIPtin 100 MG tablet  Commonly known as:  JANUVIA  Take 100 mg by mouth daily.     traMADol 50 MG tablet  Commonly known as:  ULTRAM  Take 50 mg by mouth every 6 (six) hours as needed for moderate pain.        Diagnostic Studies: Dg Chest 2 View  06/24/2013   CLINICAL DATA:  Preop, hypertension  EXAM: CHEST  2 VIEW  COMPARISON:  05/23/2010  FINDINGS: Cardiomediastinal silhouette is stable. Mild elevation of the right hemidiaphragm. Postcholecystectomy surgical clips are noted. Cervical spine stimulator wire again noted. No acute infiltrate or pulmonary edema.  IMPRESSION: No active cardiopulmonary disease.   Electronically Signed   By: Natasha Mead M.D.   On: 06/24/2013 09:47   Dg Knee Left Port  07/02/2013   CLINICAL DATA:  57 year old female status post knee arthroplasty. Pain and swelling. Initial encounter.  EXAM: PORTABLE LEFT KNEE - 1-2 VIEW  COMPARISON:  None.  FINDINGS: AP portable and cross-table lateral views. Sequelae of left total knee arthroplasty. Hardware appears intact and normally aligned. Postoperative changes to the surrounding soft tissues including subcutaneous gas.  IMPRESSION: Left knee arthroplasty with no adverse features identified.   Electronically Signed   By: Augusto Gamble M.D.   On: 07/02/2013 12:56    Disposition: 01-Home or Self Care        Follow-up Information   Follow up with NORRIS,STEVEN R, MD. Schedule an appointment as soon as possible for a visit in 2 weeks. [phone])    Specialty:  Orthopedic Surgery   Contact information:   [address] [place] [address] [phone]        Signed: Thea Gist 07/06/2013, 7:20 AM

## 2013-07-06 NOTE — Progress Notes (Signed)
Physical Therapy Treatment Patient Details Name: Claudia [name] MRN: [id number] DOB: Jan 16, 1956 Today's Date: 07/06/2013 Time: 4098-1191 PT Time Calculation (min): 23 min  PT Assessment / Plan / Recommendation  History of Present Illness Pt is a 57 y/o female admitted s/p L TKA on 07/02/13.   PT Comments   Pt cont's to make good progress with mobility.  Completed stair training this session.     Follow Up Recommendations  Home health PT     Does the patient have the potential to tolerate intense rehabilitation     Barriers to Discharge        Equipment Recommendations  Rolling walker with 5" wheels;3in1 (PT)    Recommendations for Other Services    Frequency 7X/week   Progress towards PT Goals Progress towards PT goals: Progressing toward goals  Plan Current plan remains appropriate    Precautions / Restrictions Precautions Precautions: Fall;Knee Required Braces or Orthoses: Knee Immobilizer - Left Knee Immobilizer - Left: On when out of bed or walking Restrictions LLE Weight Bearing: Weight bearing as tolerated       Mobility  Bed Mobility Bed Mobility: Supine to Sit;Sitting - Scoot to Edge of Bed Supine to Sit: 6: Modified independent (Device/Increase time) Sitting - Scoot to Edge of Bed: 6: Modified independent (Device/Increase time) Transfers Transfers: Sit to Stand;Stand to Sit Sit to Stand: With upper extremity assist;From bed;6: Modified independent (Device/Increase time) Stand to Sit: 6: Modified independent (Device/Increase time);With upper extremity assist;With armrests;To chair/3-in-1 Ambulation/Gait Ambulation/Gait Assistance: 5: Supervision Ambulation Distance (Feet): 100 Feet Assistive device: Rolling walker Ambulation/Gait Assistance Details: Pt demonstrates proper sequencing & safe use of RW.   Gait Pattern: Step-through pattern;Decreased stride length Stairs: Yes Stairs Assistance: 4: Min guard Stair Management Technique: Two rails;One rail  Right;Forwards;Step to pattern Number of Stairs: 2 (2x's) Wheelchair Mobility Wheelchair Mobility: No      PT Goals (current goals can now be found in the care plan section) Acute Rehab PT Goals PT Goal Formulation: With patient/family Time For Goal Achievement: 07/09/13 Potential to Achieve Goals: Good  Visit Information  Last PT Received On: 07/06/13 Assistance Needed: +1 History of Present Illness: Pt is a 57 y/o female admitted s/p L TKA on 07/02/13.    Subjective Data      Cognition  Cognition Arousal/Alertness: Awake/alert Behavior During Therapy: WFL for tasks assessed/performed Overall Cognitive Status: Within Functional Limits for tasks assessed    Balance     End of Session PT - End of Session Equipment Utilized During Treatment: Left knee immobilizer Activity Tolerance: Patient tolerated treatment well Patient left: in chair;with call bell/phone within reach;with family/visitor present Nurse Communication: Mobility status   GP     [name] 07/06/2013, 9:24 AM   Verdell Face, PTA [phone] 07/06/2013

## 2014-01-13 ENCOUNTER — Encounter: Payer: Self-pay | Admitting: Family Medicine

## 2014-01-13 ENCOUNTER — Ambulatory Visit (INDEPENDENT_AMBULATORY_CARE_PROVIDER_SITE_OTHER): Payer: 59 | Admitting: Family Medicine

## 2014-01-13 VITALS — BP 122/79 | HR 81 | Temp 98.1°F | Resp 16 | Ht 66.0 in | Wt 219.0 lb

## 2014-01-13 DIAGNOSIS — M159 Polyosteoarthritis, unspecified: Secondary | ICD-10-CM

## 2014-01-13 DIAGNOSIS — E119 Type 2 diabetes mellitus without complications: Secondary | ICD-10-CM

## 2014-01-13 DIAGNOSIS — Z96659 Presence of unspecified artificial knee joint: Secondary | ICD-10-CM

## 2014-01-13 DIAGNOSIS — M654 Radial styloid tenosynovitis [de Quervain]: Secondary | ICD-10-CM

## 2014-01-13 MED ORDER — HYDROCODONE-ACETAMINOPHEN 5-325 MG PO TABS: 1.0000 | ORAL_TABLET | ORAL | Status: DC | PRN

## 2014-01-13 MED ORDER — METHOCARBAMOL 500 MG PO TABS: 500.0000 mg | ORAL_TABLET | Freq: Four times a day (QID) | ORAL | Status: DC | PRN

## 2014-01-13 MED ORDER — DAPAGLIFLOZIN PROPANEDIOL 5 MG PO TABS: 5.0000 mg | ORAL_TABLET | Freq: Every day | ORAL | Status: DC

## 2014-01-13 MED ORDER — GLIPIZIDE 10 MG PO TABS: 10.0000 mg | ORAL_TABLET | Freq: Two times a day (BID) | ORAL | Status: DC

## 2014-01-13 MED ORDER — DICLOFENAC SODIUM 1 % TD GEL: 2.0000 g | Freq: Two times a day (BID) | TRANSDERMAL | Status: DC

## 2014-01-13 MED ORDER — SAXAGLIPTIN HCL 5 MG PO TABS: 5.0000 mg | ORAL_TABLET | Freq: Every day | ORAL | Status: DC

## 2014-01-13 MED ORDER — LISINOPRIL 5 MG PO TABS: 5.0000 mg | ORAL_TABLET | Freq: Every day | ORAL | Status: DC

## 2014-01-13 MED ORDER — METFORMIN HCL 500 MG PO TABS: 1000.0000 mg | ORAL_TABLET | Freq: Two times a day (BID) | ORAL | Status: DC

## 2014-01-13 MED ORDER — TRAMADOL HCL 50 MG PO TABS: 50.0000 mg | ORAL_TABLET | Freq: Four times a day (QID) | ORAL | Status: DC | PRN
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
## 2014-01-13 NOTE — Progress Notes (Signed)
Pre visit review using our clinic review tool, if applicable. No additional management support is needed unless otherwise documented below in the visit note. 

## 2014-02-09 ENCOUNTER — Ambulatory Visit: Payer: 59 | Admitting: Family Medicine

## 2014-02-15 ENCOUNTER — Encounter: Payer: Self-pay | Admitting: Family Medicine

## 2014-02-15 ENCOUNTER — Ambulatory Visit (INDEPENDENT_AMBULATORY_CARE_PROVIDER_SITE_OTHER): Payer: 59 | Admitting: Family Medicine

## 2014-02-15 VITALS — BP 112/76 | HR 76 | Temp 97.8°F | Resp 18 | Ht 66.0 in | Wt 220.0 lb

## 2014-02-15 DIAGNOSIS — I10 Essential (primary) hypertension: Secondary | ICD-10-CM

## 2014-02-15 DIAGNOSIS — E1165 Type 2 diabetes mellitus with hyperglycemia: Principal | ICD-10-CM

## 2014-02-15 DIAGNOSIS — IMO0001 Reserved for inherently not codable concepts without codable children: Secondary | ICD-10-CM

## 2014-02-15 DIAGNOSIS — G894 Chronic pain syndrome: Secondary | ICD-10-CM

## 2014-02-15 LAB — COMPREHENSIVE METABOLIC PANEL
ALT: 22 U/L (ref 0–35)
AST: 17 U/L (ref 0–37)
Albumin: 3.8 g/dL (ref 3.5–5.2)
Alkaline Phosphatase: 117 U/L (ref 39–117)
BUN: 11 mg/dL (ref 6–23)
CO2: 29 mEq/L (ref 19–32)
Calcium: 9.7 mg/dL (ref 8.4–10.5)
Chloride: 101 mEq/L (ref 96–112)
Creatinine, Ser: 0.8 mg/dL (ref 0.4–1.2)
GFR: 78.12 mL/min (ref >60.00)
Glucose, Bld: 159 mg/dL — ABNORMAL HIGH (ref 70–99)
Potassium: 4.3 mEq/L (ref 3.5–5.1)
Sodium: 135 mEq/L (ref 135–145)
Total Bilirubin: 0.7 mg/dL (ref 0.2–1.2)
Total Protein: 7.4 g/dL (ref 6.0–8.3)

## 2014-02-15 LAB — LIPID PANEL
Cholesterol: 168 mg/dL (ref 0–200)
HDL: 48.2 mg/dL (ref >39.00)
LDL Cholesterol: 95 mg/dL (ref 0–99)
NonHDL: 119.8
Total CHOL/HDL Ratio: 3
Triglycerides: 124 mg/dL (ref 0.0–149.0)
VLDL: 24.8 mg/dL (ref 0.0–40.0)

## 2014-02-15 LAB — MICROALBUMIN / CREATININE URINE RATIO
Creatinine,U: 69.2 mg/dL
Microalb Creat Ratio: 0.4 mg/g (ref 0.0–30.0)
Microalb, Ur: 0.3 mg/dL (ref 0.0–1.9)

## 2014-02-15 LAB — HEMOGLOBIN A1C: Hgb A1c MFr Bld: 6.8 % — ABNORMAL HIGH (ref 4.6–6.5)

## 2014-02-15 MED ORDER — HYDROCODONE-ACETAMINOPHEN 5-325 MG PO TABS: 1.0000 | ORAL_TABLET | ORAL | Status: DC | PRN

## 2014-02-15 MED ORDER — TRAMADOL HCL 50 MG PO TABS: 50.0000 mg | ORAL_TABLET | Freq: Four times a day (QID) | ORAL | Status: DC | PRN

## 2014-02-15 NOTE — Progress Notes (Signed)
Pre visit review using our clinic review tool, if applicable. No additional management support is needed unless otherwise documented below in the visit note. 

## 2014-02-15 NOTE — Progress Notes (Addendum)
OFFICE NOTE  02/15/2014  CC:  Chief Complaint  Patient presents with  . Follow-up    4 weeks, fasting   HPI: Patient is a 58 y.o. Caucasian female who is here for 1 mo f/u DM, chronic pain syndrome. Fasting CBGs: 150-190.  No hypoglycemia.  She does not eat a strict diabetic diet. Home bp checks all normal per pt.  Still having some right wrist DeQuervains tenosynovitis pain.  Her orthopedist looked at it, gave her an additional wrist brace to wear at night.  Told her to give it more time and rest it.  Return if not better.  Pertinent PMH:  Past medical, surgical, social, and family history reviewed and no changes are noted since last office visit.  MEDS:  Outpatient Prescriptions Prior to Visit  Medication Sig Dispense Refill  . Dapagliflozin Propanediol (FARXIGA) 5 MG TABS Take 5 mg by mouth daily before breakfast.  30 tablet  4  . diclofenac sodium (VOLTAREN) 1 % GEL Apply 2 g topically 2 (two) times daily.  100 g  3  . glipiZIDE (GLUCOTROL) 10 MG tablet Take 1 tablet (10 mg total) by mouth 2 (two) times daily before a meal.  60 tablet  3  . HYDROcodone-acetaminophen (NORCO/VICODIN) 5-325 MG per tablet Take 1 tablet by mouth every 4 (four) hours as needed for moderate pain.  60 tablet  0  . lisinopril (PRINIVIL,ZESTRIL) 5 MG tablet Take 1 tablet (5 mg total) by mouth daily.  30 tablet  3  . metFORMIN (GLUCOPHAGE) 500 MG tablet Take 2 tablets (1,000 mg total) by mouth 2 (two) times daily with a meal.  120 tablet  3  . methocarbamol (ROBAXIN) 500 MG tablet Take 1 tablet (500 mg total) by mouth every 6 (six) hours as needed for muscle spasms.  60 tablet  0  . saxagliptin HCl (ONGLYZA) 5 MG TABS tablet Take 1 tablet (5 mg total) by mouth daily.  30 tablet  3  . traMADol (ULTRAM) 50 MG tablet Take 1 tablet (50 mg total) by mouth every 6 (six) hours as needed for moderate pain.  90 tablet  0   No facility-administered medications prior to visit.    PE: Blood pressure 112/76, pulse 76,  temperature 97.8 F (36.6 C), temperature source Temporal, resp. rate 18, height 5\' 6"  (1.676 m), weight 220 lb (99.791 kg), SpO2 99.00%. Gen: Alert, well appearing.  Patient is oriented to person, place, time, and situation. No further exam today.  IMPRESSION AND PLAN:  1) DM 2, control not ideal. HbA1c and urine microalb/cr today. Feet exam next visit. CMET today  2) HTN; The current medical regimen is effective;  continue present plan and medications. Lytes/cr today  3) Chronic pain syndrome: knee osteoarthritis: Controlled substance contract reviewed with patient today.  UDS was done last visit : no result received yet--we are working on tracking this down currently. I printed rx's for tramadol and vicodin today for this month, August, and September 2015.  Appropriate fill on/after date was noted on each rx.  Request old records again.  An After Visit Summary was printed and given to the patient.  FOLLOW UP: 3 mo  ADDENDUM 02/16/14: UDS from 01/13/14 showed tramadol and hydrocodone (appropriate), but also showed high concentration of alcohol.  Will contact pt to review this and discuss plan for f/u UDS, etc.

## 2014-02-16 ENCOUNTER — Telehealth: Payer: Self-pay | Admitting: Family Medicine

## 2014-02-16 NOTE — Telephone Encounter (Signed)
Called pt and discussed her UDS from 01/13/14, which showed high level of Etoh. She absolutely denies drinking any alcohol at all. I told her that we'll have to repeat her UDS at next o/v and verify that no alcohol is present before we can continue rx'ing pain meds--as per drug contract in chart and as per safe practice.  She expressed understanding and was agreeable to this.

## 2014-03-17 ENCOUNTER — Encounter: Payer: Self-pay | Admitting: Family Medicine

## 2014-03-21 ENCOUNTER — Encounter: Payer: Self-pay | Admitting: Family Medicine

## 2014-04-19 ENCOUNTER — Telehealth: Payer: Self-pay | Admitting: Family Medicine

## 2014-04-19 MED ORDER — DICLOFENAC SODIUM 1 % TD GEL: 2.0000 g | Freq: Two times a day (BID) | TRANSDERMAL | Status: DC

## 2014-04-19 NOTE — Telephone Encounter (Signed)
OK to RF as previously prescribed x 3.

## 2014-04-19 NOTE — Telephone Encounter (Signed)
Rx sent to [facility].

## 2014-04-19 NOTE — Telephone Encounter (Signed)
RF request for voltaren gel.  Patient last OV was 02/15/14.  Last RX was 01/13/14 x 3 rfs.  Please advise.

## 2014-05-18 ENCOUNTER — Other Ambulatory Visit: Payer: Self-pay | Admitting: Family Medicine

## 2014-05-18 NOTE — Telephone Encounter (Signed)
RF request for tramadol.  Patient last OV was 02/15/14.  Last RX was 02/15/14 x 2 rfs.  Please advise.

## 2014-05-23 ENCOUNTER — Other Ambulatory Visit: Payer: Self-pay | Admitting: Family Medicine

## 2014-05-23 ENCOUNTER — Encounter: Payer: Self-pay | Admitting: Family Medicine

## 2014-05-23 ENCOUNTER — Ambulatory Visit (INDEPENDENT_AMBULATORY_CARE_PROVIDER_SITE_OTHER): Payer: 59 | Admitting: Family Medicine

## 2014-05-23 VITALS — BP 127/83 | HR 76 | Temp 97.8°F | Resp 18 | Ht 66.0 in | Wt 219.0 lb

## 2014-05-23 DIAGNOSIS — Z1231 Encounter for screening mammogram for malignant neoplasm of breast: Secondary | ICD-10-CM

## 2014-05-23 DIAGNOSIS — E119 Type 2 diabetes mellitus without complications: Secondary | ICD-10-CM

## 2014-05-23 DIAGNOSIS — Z1239 Encounter for other screening for malignant neoplasm of breast: Secondary | ICD-10-CM

## 2014-05-23 DIAGNOSIS — G894 Chronic pain syndrome: Secondary | ICD-10-CM

## 2014-05-23 DIAGNOSIS — M1711 Unilateral primary osteoarthritis, right knee: Secondary | ICD-10-CM

## 2014-05-23 DIAGNOSIS — Z23 Encounter for immunization: Secondary | ICD-10-CM

## 2014-05-23 LAB — HEMOGLOBIN A1C: Hgb A1c MFr Bld: 6.6 % — ABNORMAL HIGH (ref 4.6–6.5)

## 2014-05-23 MED ORDER — HYDROCODONE-ACETAMINOPHEN 5-325 MG PO TABS: ORAL_TABLET | ORAL | Status: DC

## 2014-05-23 NOTE — Progress Notes (Signed)
Pre visit review using our clinic review tool, if applicable. No additional management support is needed unless otherwise documented below in the visit note. 

## 2014-05-23 NOTE — Progress Notes (Signed)
OFFICE NOTE  05/23/2014  CC:  Chief Complaint  Patient presents with  . Follow-up    fasting   HPI: Patient is a 58 y.o. Caucasian female who is here for 3 mo f/u DM 2 and chroni right knee pain secondary to osteoarthritis. Says she is doing well except chronic pain in right knee and lately pain right hand/wrist.  Pain med use: 1 tramadol in morning, 1 vicodin hs. No blood sugars to report. Feet: no burning, tingling, or numbness.  ROS: no cough, fever, HA, ST, vision complaints, or dizziness.  Pertinent PMH:  Past medical, surgical, social, and family history reviewed and no changes are noted since last office visit.  MEDS:  Outpatient Prescriptions Prior to Visit  Medication Sig Dispense Refill  . Dapagliflozin Propanediol (FARXIGA) 5 MG TABS Take 5 mg by mouth daily before breakfast.  30 tablet  4  . diclofenac sodium (VOLTAREN) 1 % GEL Apply 2 g topically 2 (two) times daily.  100 g  3  . glipiZIDE (GLUCOTROL) 10 MG tablet Take 1 tablet (10 mg total) by mouth 2 (two) times daily before a meal.  60 tablet  3  . HYDROcodone-acetaminophen (NORCO/VICODIN) 5-325 MG per tablet Take 1 tablet by mouth every 4 (four) hours as needed for moderate pain.  60 tablet  0  . lisinopril (PRINIVIL,ZESTRIL) 5 MG tablet Take 1 tablet (5 mg total) by mouth daily.  30 tablet  3  . metFORMIN (GLUCOPHAGE) 500 MG tablet Take 2 tablets (1,000 mg total) by mouth 2 (two) times daily with a meal.  120 tablet  3  . methocarbamol (ROBAXIN) 500 MG tablet Take 1 tablet (500 mg total) by mouth every 6 (six) hours as needed for muscle spasms.  60 tablet  0  . saxagliptin HCl (ONGLYZA) 5 MG TABS tablet Take 1 tablet (5 mg total) by mouth daily.  30 tablet  3  . traMADol (ULTRAM) 50 MG tablet TAKE ONE TABLET BY MOUTH EVERY 6 HOURS AS NEEDED FOR MODERATE PAIN  90 tablet  2   No facility-administered medications prior to visit.    PE: Blood pressure 127/83, pulse 76, temperature 97.8 F (36.6 C), temperature  source Temporal, resp. rate 18, height 5\' 6"  (1.676 m), weight 219 lb (99.338 kg), SpO2 100.00%. Gen: Alert, well appearing.  Patient is oriented to person, place, time, and situation. AFFECT: pleasant, lucid thought and speech. No further exam today.  IMPRESSION AND PLAN:  1) DM 2, historically well-controlled. HbA1c today.  Foot exam normal today. Eye exam UTD.  2) Chronic pain: she uses reasonably small amounts of tramadol and vicodin. I gave rx today for tramadol 50mg , #90, RF x 2 and a rx for vicodin 5/325, 1-2 tabs qhs, #60, no RF.  3) Preventative health care: breast cancer screening--pt does desire mammogram so I ordered this today. Flu vaccine given IM today. An After Visit Summary was printed and given to the patient.  FOLLOW UP: 53mo

## 2014-06-01 ENCOUNTER — Ambulatory Visit (INDEPENDENT_AMBULATORY_CARE_PROVIDER_SITE_OTHER): Payer: 59 | Admitting: Nurse Practitioner

## 2014-06-01 ENCOUNTER — Encounter: Payer: Self-pay | Admitting: Nurse Practitioner

## 2014-06-01 VITALS — BP 108/70 | HR 70 | Temp 97.4°F | Ht 66.0 in | Wt 222.0 lb

## 2014-06-01 DIAGNOSIS — B9789 Other viral agents as the cause of diseases classified elsewhere: Principal | ICD-10-CM

## 2014-06-01 DIAGNOSIS — J069 Acute upper respiratory infection, unspecified: Secondary | ICD-10-CM | POA: Insufficient documentation

## 2014-06-01 MED ORDER — BENZONATATE 100 MG PO CAPS: ORAL_CAPSULE | ORAL | Status: DC

## 2014-06-01 MED ORDER — HYDROCODONE-HOMATROPINE 5-1.5 MG/5ML PO SYRP: 5.0000 mL | ORAL_SOLUTION | Freq: Every evening | ORAL | Status: DC | PRN

## 2014-06-01 NOTE — Progress Notes (Signed)
Pre visit review using our clinic review tool, if applicable. No additional management support is needed unless otherwise documented below in the visit note. 

## 2014-06-01 NOTE — Progress Notes (Signed)
   Subjective:    Patient ID: Claudia [name], female    DOB: 07/02/1956, 58 y.o.   MRN: [id number]  URI  This is a new problem. The current episode started in the past 7 days (3d). The problem has been unchanged. There has been no fever. Associated symptoms include congestion, coughing, ear pain, nausea, a plugged ear sensation and a sore throat. Pertinent negatives include no abdominal pain, chest pain, diarrhea, headaches, sinus pain, sneezing, vomiting or wheezing. She has tried nothing for the symptoms.      Review of Systems  Constitutional: Negative for fever, activity change, appetite change and fatigue.  HENT: Positive for congestion, ear pain and sore throat. Negative for sneezing.   Respiratory: Positive for cough. Negative for wheezing.   Cardiovascular: Negative for chest pain.  Gastrointestinal: Positive for nausea. Negative for vomiting, abdominal pain and diarrhea.  Neurological: Negative for headaches.       Objective:   Physical Exam  Vitals reviewed. Constitutional: She is oriented to person, place, and time. She appears well-developed and well-nourished. No distress.  HENT:  Head: Normocephalic and atraumatic.  Mouth/Throat: Oropharynx is clear and moist. No oropharyngeal exudate.  bilat canals erythematous, not swollen  Eyes: Conjunctivae are normal. Right eye exhibits no discharge. Left eye exhibits no discharge.  Neck: Normal range of motion. Neck supple. No thyromegaly present.  Cardiovascular: Normal rate, regular rhythm and normal heart sounds.   No murmur heard. Pulmonary/Chest: Effort normal and breath sounds normal. No respiratory distress. She has no wheezes. She has no rales.  Lymphadenopathy:    She has no cervical adenopathy.  Neurological: She is alert and oriented to person, place, and time.  Skin: Skin is warm and dry.  Psychiatric: She has a normal mood and affect. Her behavior is normal. Thought content normal.          Assessment &  Plan:  1. Viral upper respiratory tract infection with cough - benzonatate (TESSALON) 100 MG capsule; Take 1-2 capsules po up to 3 times daily PRN cough  Dispense: 60 capsule; Refill: 0 - HYDROcodone-homatropine (HYCODAN) 5-1.5 MG/5ML syrup; Take 5 mLs by mouth at bedtime as needed for cough.  Dispense: 120 mL; Refill: 0  F/u PRN

## 2014-06-01 NOTE — Patient Instructions (Signed)
You have a  virus causing your symptoms. The average duration of cold symptoms is 14 days. If you develop bronchitis, you may cough for 3 to 4 weeks. Start daily sinus rinses (neilmed Sinus Rinse).  Take benzonatate capsules for cough during day & syrup for night time. Sip fluids every hour. Rest. If you are not feeling better in 10 days or develop fever or chest pain, call us for re-evaluation. Feel better!  Upper Respiratory Infection, Adult An upper respiratory infection (URI) is also sometimes known as the common cold. The upper respiratory tract includes the nose, sinuses, throat, trachea, and bronchi. Bronchi are the airways leading to the lungs. Most people improve within 1 week, but symptoms can last up to 2 weeks. A residual cough may last even longer.  CAUSES Many different viruses can infect the tissues lining the upper respiratory tract. The tissues become irritated and inflamed and often become very moist. Mucus production is also common. A cold is contagious. You can easily spread the virus to others by oral contact. This includes kissing, sharing a glass, coughing, or sneezing. Touching your mouth or nose and then touching a surface, which is then touched by another person, can also spread the virus. SYMPTOMS  Symptoms typically develop 1 to 3 days after you come in contact with a cold virus. Symptoms vary from person to person. They may include:  Runny nose.  Sneezing.  Nasal congestion.  Sinus irritation.  Sore throat.  Loss of voice (laryngitis).  Cough.  Fatigue.  Muscle aches.  Loss of appetite.  Headache.  Low-grade fever. DIAGNOSIS  You might diagnose your own cold based on familiar symptoms, since most people get a cold 2 to 3 times a year. Your caregiver can confirm this based on your exam. Most importantly, your caregiver can check that your symptoms are not due to another disease such as strep throat, sinusitis, pneumonia, asthma, or epiglottitis. Blood  tests, throat tests, and X-rays are not necessary to diagnose a common cold, but they may sometimes be helpful in excluding other more serious diseases. Your caregiver will decide if any further tests are required. RISKS AND COMPLICATIONS  You may be at risk for a more severe case of the common cold if you smoke cigarettes, have chronic heart disease (such as heart failure) or lung disease (such as asthma), or if you have a weakened immune system. The very young and very old are also at risk for more serious infections. Bacterial sinusitis, middle ear infections, and bacterial pneumonia can complicate the common cold. The common cold can worsen asthma and chronic obstructive pulmonary disease (COPD). Sometimes, these complications can require emergency medical care and may be life-threatening. PREVENTION  The best way to protect against getting a cold is to practice good hygiene. Avoid oral or hand contact with people with cold symptoms. Wash your hands often if contact occurs. There is no clear evidence that vitamin C, vitamin E, echinacea, or exercise reduces the chance of developing a cold. However, it is always recommended to get plenty of rest and practice good nutrition. TREATMENT  Treatment is directed at relieving symptoms. There is no cure. Antibiotics are not effective, because the infection is caused by a virus, not by bacteria. Treatment may include:  Increased fluid intake. Sports drinks offer valuable electrolytes, sugars, and fluids.  Breathing heated mist or steam (vaporizer or shower).  Eating chicken soup or other clear broths, and maintaining good nutrition.  Getting plenty of rest.  Using gargles or  lozenges for comfort.  Controlling fevers with ibuprofen or acetaminophen as directed by your caregiver.  Increasing usage of your inhaler if you have asthma. Zinc gel and zinc lozenges, taken in the first 24 hours of the common cold, can shorten the duration and lessen the  severity of symptoms. Pain medicines may help with fever, muscle aches, and throat pain. A variety of non-prescription medicines are available to treat congestion and runny nose. Your caregiver can make recommendations and may suggest nasal or lung inhalers for other symptoms.  HOME CARE INSTRUCTIONS   Only take over-the-counter or prescription medicines for pain, discomfort, or fever as directed by your caregiver.  Use a warm mist humidifier or inhale steam from a shower to increase air moisture. This may keep secretions moist and make it easier to breathe.  Drink enough water and fluids to keep your urine clear or pale yellow.  Rest as needed.  Return to work when your temperature has returned to normal or as your caregiver advises. You may need to stay home longer to avoid infecting others. You can also use a face mask and careful hand washing to prevent spread of the virus. SEEK MEDICAL CARE IF:   After the first few days, you feel you are getting worse rather than better.  You need your caregiver's advice about medicines to control symptoms.  You develop chills, worsening shortness of breath, or brown or red sputum. These may be signs of pneumonia.  You develop yellow or brown nasal discharge or pain in the face, especially when you bend forward. These may be signs of sinusitis.  You develop a fever, swollen neck glands, pain with swallowing, or white areas in the back of your throat. These may be signs of strep throat. SEEK IMMEDIATE MEDICAL CARE IF:   You have a fever.  You develop severe or persistent headache, ear pain, sinus pain, or chest pain.  You develop wheezing, a prolonged cough, cough up blood, or have a change in your usual mucus (if you have chronic lung disease).  You develop sore muscles or a stiff neck. Document Released: 01/22/2001 Document Revised: 10/21/2011 Document Reviewed: 11/30/2010 [facility] Patient Information 2014 [place], [place].

## 2014-06-13 ENCOUNTER — Other Ambulatory Visit: Payer: Self-pay | Admitting: Family Medicine

## 2014-06-13 ENCOUNTER — Ambulatory Visit
Admission: RE | Admit: 2014-06-13 | Discharge: 2014-06-13 | Disposition: A | Discharge status: Home / Self Care | Payer: 59 | Source: Ambulatory Visit | Attending: Family Medicine | Admitting: Family Medicine

## 2014-06-13 DIAGNOSIS — Z1231 Encounter for screening mammogram for malignant neoplasm of breast: Secondary | ICD-10-CM

## 2014-06-20 ENCOUNTER — Telehealth: Payer: Self-pay | Admitting: Family Medicine

## 2014-06-20 NOTE — Telephone Encounter (Signed)
Patient requesting rf of hydrocodone.  Patient also wants to know if you got results of her recent mammogram.  She states no one informed her of results.  Please advise.

## 2014-06-21 MED ORDER — HYDROCODONE-ACETAMINOPHEN 5-325 MG PO TABS: ORAL_TABLET | ORAL | Status: DC

## 2014-06-21 NOTE — Telephone Encounter (Signed)
Vicodin rx for this month and for next month printed. Pls tell her that her mammogram was NORMAL.  She was supposed to have been sent a letter from the radiology office stating that her result was normal.  Maybe it hasn't reached her yet.  Reassure, next mammogram can be in 1 yr.-thx

## 2014-06-21 NOTE — Telephone Encounter (Signed)
Spoke with pt, advised Rx's were ready to be picked up and her mammogram was normal. Pt understood.

## 2014-06-27 ENCOUNTER — Encounter: Payer: Self-pay | Admitting: Family Medicine

## 2014-06-27 ENCOUNTER — Ambulatory Visit (INDEPENDENT_AMBULATORY_CARE_PROVIDER_SITE_OTHER): Payer: 59 | Admitting: Family Medicine

## 2014-06-27 ENCOUNTER — Ambulatory Visit (HOSPITAL_BASED_OUTPATIENT_CLINIC_OR_DEPARTMENT_OTHER)
Admission: RE | Admit: 2014-06-27 | Discharge: 2014-06-27 | Disposition: A | Discharge status: Home / Self Care | Payer: 59 | Source: Ambulatory Visit | Attending: Family Medicine | Admitting: Family Medicine

## 2014-06-27 VITALS — BP 129/78 | HR 81 | Temp 98.8°F | Resp 18 | Ht 66.0 in | Wt 223.0 lb

## 2014-06-27 DIAGNOSIS — R059 Cough, unspecified: Secondary | ICD-10-CM

## 2014-06-27 DIAGNOSIS — R0781 Pleurodynia: Secondary | ICD-10-CM | POA: Insufficient documentation

## 2014-06-27 DIAGNOSIS — R05 Cough: Secondary | ICD-10-CM

## 2014-06-27 NOTE — Progress Notes (Signed)
OFFICE NOTE  06/27/2014  CC:  Chief Complaint  Patient presents with  . Flank Pain    right sided   . Cough   HPI: Patient is a 58 y.o. Caucasian female who is here for pain on right side of body. Has had URI with lots of coughing lately, was seen here 06/01/14 for this. About 1 wk ago she started having pain in right side of rib cage.  Hurts constantly but coughing or deep breathing and leaning over to grab something makes it hurt worse.  No fever.  Her URI is resolved, cough is gradually going away.  Voltaren gel to the area has helped some.   Pertinent PMH:  Past surgical, social, and family history reviewed and no changes noted since last office visit.  MEDS:  Outpatient Prescriptions Prior to Visit  Medication Sig Dispense Refill  . Dapagliflozin Propanediol (FARXIGA) 5 MG TABS Take 5 mg by mouth daily before breakfast. 30 tablet 4  . diclofenac sodium (VOLTAREN) 1 % GEL Apply 2 g topically 2 (two) times daily. 100 g 3  . glipiZIDE (GLUCOTROL) 10 MG tablet Take 1 tablet (10 mg total) by mouth 2 (two) times daily before a meal. 60 tablet 3  . HYDROcodone-acetaminophen (NORCO/VICODIN) 5-325 MG per tablet 1-2 tabs po qhs prn pain 60 tablet 0  . lisinopril (PRINIVIL,ZESTRIL) 5 MG tablet Take 1 tablet (5 mg total) by mouth daily. 30 tablet 3  . metFORMIN (GLUCOPHAGE) 500 MG tablet Take 2 tablets (1,000 mg total) by mouth 2 (two) times daily with a meal. 120 tablet 3  . Probiotic Product (PROBIOTIC DAILY) CAPS Take by mouth. RepHresh Pro-B    . saxagliptin HCl (ONGLYZA) 5 MG TABS tablet Take 1 tablet (5 mg total) by mouth daily. 30 tablet 3  . traMADol (ULTRAM) 50 MG tablet TAKE ONE TABLET BY MOUTH EVERY 6 HOURS AS NEEDED FOR MODERATE PAIN 90 tablet 2  . benzonatate (TESSALON) 100 MG capsule Take 1-2 capsules po up to 3 times daily PRN cough 60 capsule 0  . HYDROcodone-homatropine (HYCODAN) 5-1.5 MG/5ML syrup Take 5 mLs by mouth at bedtime as needed for cough. 120 mL 0  .  methocarbamol (ROBAXIN) 500 MG tablet Take 1 tablet (500 mg total) by mouth every 6 (six) hours as needed for muscle spasms. 60 tablet 0   No facility-administered medications prior to visit.    PE: Blood pressure 129/78, pulse 81, temperature 98.8 F (37.1 C), temperature source Temporal, resp. rate 18, height 5\' 6"  (1.676 m), weight 223 lb (101.152 kg), SpO2 96 %.  Pt examined with CMA Jacklynn Ganong as chaperone. Gen: Alert, well appearing.  Patient is oriented to person, place, time, and situation. RFF:MBWG: no injection, icteris, swelling, or exudate.  EOMI, PERRLA. Mouth: lips without lesion/swelling.  Oral mucosa pink and moist. Oropharynx without erythema, exudate, or swelling.  CV: RRR, no m/r/g.   LUNGS: CTA bilat, nonlabored resps, good aeration in all lung fields. Entire right side of chest wall from mid clavicular line all the way around to right paraspinous mm's is TTP.  No focal TTP, no crepitus.  No rash or deformity or bruising.   IMPRESSION AND PLAN:  Right chest wall pain, likely musculoskeletal strain from coughing. R/o rib fx with rib x-ray today. Continue to apply voltaren gel to area qid prn.  Cough suppression with OTC generic delsym recommended.  An After Visit Summary was printed and given to the patient.  FOLLOW UP: prn

## 2014-06-27 NOTE — Progress Notes (Signed)
Pre visit review using our clinic review tool, if applicable. No additional management support is needed unless otherwise documented below in the visit note. 

## 2014-06-27 NOTE — Patient Instructions (Signed)
Buy generic OTC delsym for cough suppression.

## 2014-08-22 ENCOUNTER — Other Ambulatory Visit: Payer: Self-pay | Admitting: Family Medicine

## 2014-08-22 NOTE — Telephone Encounter (Signed)
Pt requesting rf of tramadol.  Last RX was sent 05/22/14 x 2 rfs.    Pt also requesting rf of voltaren gel.  Last Rx was 05/09/14 x 3 rfs.  Please advise.

## 2014-08-23 ENCOUNTER — Other Ambulatory Visit: Payer: Self-pay | Admitting: Family Medicine

## 2014-08-23 MED ORDER — LISINOPRIL 5 MG PO TABS: 5.0000 mg | ORAL_TABLET | Freq: Every day | ORAL | Status: DC

## 2014-08-23 MED ORDER — SAXAGLIPTIN HCL 5 MG PO TABS: 5.0000 mg | ORAL_TABLET | Freq: Every day | ORAL | Status: DC

## 2014-08-23 MED ORDER — HYDROCODONE-ACETAMINOPHEN 5-325 MG PO TABS: ORAL_TABLET | ORAL | Status: DC

## 2014-08-23 NOTE — Telephone Encounter (Signed)
Pt also requesting rf of hydrocodone.  Last Rx stated fill on or after 07/20/14.  Please advise.

## 2014-09-26 ENCOUNTER — Telehealth: Payer: Self-pay | Admitting: Family Medicine

## 2014-09-26 ENCOUNTER — Ambulatory Visit: Payer: 59 | Admitting: Family Medicine

## 2014-09-26 MED ORDER — METFORMIN HCL 500 MG PO TABS: 1000.0000 mg | ORAL_TABLET | Freq: Two times a day (BID) | ORAL | Status: DC

## 2014-09-26 MED ORDER — SAXAGLIPTIN HCL 5 MG PO TABS: 5.0000 mg | ORAL_TABLET | Freq: Every day | ORAL | Status: DC

## 2014-09-26 NOTE — Telephone Encounter (Signed)
Claudia [name] called and needs a refill on her Metformin and Onglyza. Please call and let her know when they are called in./DH

## 2014-09-26 NOTE — Telephone Encounter (Signed)
I eRx'd metformin and Onglyza per pt request.

## 2014-10-03 ENCOUNTER — Encounter: Payer: Self-pay | Admitting: Family Medicine

## 2014-10-03 ENCOUNTER — Ambulatory Visit (INDEPENDENT_AMBULATORY_CARE_PROVIDER_SITE_OTHER): Payer: 59 | Admitting: Family Medicine

## 2014-10-03 VITALS — BP 124/79 | HR 64 | Temp 98.0°F | Resp 18 | Ht 66.0 in | Wt 218.0 lb

## 2014-10-03 DIAGNOSIS — M159 Polyosteoarthritis, unspecified: Secondary | ICD-10-CM | POA: Insufficient documentation

## 2014-10-03 DIAGNOSIS — G4700 Insomnia, unspecified: Secondary | ICD-10-CM | POA: Insufficient documentation

## 2014-10-03 DIAGNOSIS — M150 Primary generalized (osteo)arthritis: Secondary | ICD-10-CM

## 2014-10-03 DIAGNOSIS — M8949 Other hypertrophic osteoarthropathy, multiple sites: Secondary | ICD-10-CM

## 2014-10-03 DIAGNOSIS — I10 Essential (primary) hypertension: Secondary | ICD-10-CM

## 2014-10-03 DIAGNOSIS — E119 Type 2 diabetes mellitus without complications: Secondary | ICD-10-CM

## 2014-10-03 LAB — HEMOGLOBIN A1C: Hgb A1c MFr Bld: 7 % — ABNORMAL HIGH (ref 4.6–6.5)

## 2014-10-03 MED ORDER — HYDROCODONE-ACETAMINOPHEN 5-325 MG PO TABS: ORAL_TABLET | ORAL | Status: DC

## 2014-10-03 NOTE — Addendum Note (Signed)
Addended by: Ralph Dowdy on: 10/03/2014 08:55 AM   Modules accepted: Orders

## 2014-10-03 NOTE — Progress Notes (Signed)
OFFICE NOTE  10/03/2014  CC:  Chief Complaint  Patient presents with  . Follow-up    not fasting  . Insomnia   HPI: Patient is a 59 y.o. Caucasian female who is here for 4 mo f/u DM 2, chronic right knee pain and low back pain/DDD requiring chronic narcotic pain medication for functioning.  Avg fasting: 150, 2H PP avg 150s.  About  A month ago she had a rash and had to be put on prednsione for about a week by [facility].  No bp monitoring done at home.  Recently having some trouble with maintaining sleep: sleeps a couple of hours then wakes up for a couple of hours.  PAIN: right hand/wrist carpel tunnel syndrome and left knee.  Her orthopedist saw her recently for her left knee and nothing different was done.  She walks daily but no formal PT.   Pertinent PMH:  Past medical, surgical, social, and family history reviewed and no changes are noted since last office visit.  MEDS:  Outpatient Prescriptions Prior to Visit  Medication Sig Dispense Refill  . Dapagliflozin Propanediol (FARXIGA) 5 MG TABS Take 5 mg by mouth daily before breakfast. 30 tablet 4  . glipiZIDE (GLUCOTROL) 10 MG tablet Take 1 tablet (10 mg total) by mouth 2 (two) times daily before a meal. 60 tablet 3  . HYDROcodone-acetaminophen (NORCO/VICODIN) 5-325 MG per tablet 1-2 tabs po qhs prn pain 60 tablet 0  . lisinopril (PRINIVIL,ZESTRIL) 5 MG tablet Take 1 tablet (5 mg total) by mouth daily. 30 tablet 3  . metFORMIN (GLUCOPHAGE) 500 MG tablet Take 2 tablets (1,000 mg total) by mouth 2 (two) times daily with a meal. 120 tablet 6  . methocarbamol (ROBAXIN) 500 MG tablet Take 1 tablet (500 mg total) by mouth every 6 (six) hours as needed for muscle spasms. 60 tablet 0  . Probiotic Product (PROBIOTIC DAILY) CAPS Take by mouth. RepHresh Pro-B    . saxagliptin HCl (ONGLYZA) 5 MG TABS tablet Take 1 tablet (5 mg total) by mouth daily. 30 tablet 6  . traMADol (ULTRAM) 50 MG tablet TAKE ONE TABLET BY MOUTH EVERY 6 HOURS  AS NEEDED FOR MODERATE PAIN 90 tablet 3  . VOLTAREN 1 % GEL APPLY 2 GRAMS TOPICALLY TWICE DAILY 100 g 6  . benzonatate (TESSALON) 100 MG capsule Take 1-2 capsules po up to 3 times daily PRN cough 60 capsule 0   No facility-administered medications prior to visit.    PE: Blood pressure 124/79, pulse 64, temperature 98 F (36.7 C), temperature source Temporal, resp. rate 18, height 5\' 6"  (1.676 m), weight 218 lb (98.884 kg), SpO2 100 %. Gen: Alert, well appearing.  Patient is oriented to person, place, time, and situation. AFFECT: pleasant, lucid thought and speech. No further exam today.  LABS: none today RECENT: Lab Results  Component Value Date   HGBA1C 6.6* 05/23/2014   Lab Results  Component Value Date   CHOL 168 02/15/2014   HDL 48.20 02/15/2014   LDLCALC 95 02/15/2014   TRIG 124.0 02/15/2014   CHOLHDL 3 02/15/2014     Chemistry      Component Value Date/Time   NA 135 02/15/2014 0850   K 4.3 02/15/2014 0850   CL 101 02/15/2014 0850   CO2 29 02/15/2014 0850   BUN 11 02/15/2014 0850   CREATININE 0.8 02/15/2014 0850      Component Value Date/Time   CALCIUM 9.7 02/15/2014 0850   ALKPHOS 117 02/15/2014 0850   AST 17  02/15/2014 0850   ALT 22 02/15/2014 0850   BILITOT 0.7 02/15/2014 0850      IMPRESSION AND PLAN:  1) DM 2, without complication:   Not ideal control per home monitoring. HbA1c today.  2) HTN; The current medical regimen is effective;  continue present plan and medications. BMET today.  3) Chronic pain: stable.  She did not need RF of her tramadol today. I gave rx for vicodin today, 5/325, 1 tab po qhs, #90, no RF.  4) Insomnia: needs to work on sleep hygiene: NO TURNING TV ON WHEN SHE WAKES UP IN NIGHTTIME.  An After Visit Summary was printed and given to the patient.  FOLLOW UP: 4 mo

## 2014-10-03 NOTE — Progress Notes (Signed)
Pre visit review using our clinic review tool, if applicable. No additional management support is needed unless otherwise documented below in the visit note. 

## 2014-10-04 ENCOUNTER — Other Ambulatory Visit: Payer: Self-pay | Admitting: Family Medicine

## 2014-10-04 ENCOUNTER — Telehealth: Payer: Self-pay | Admitting: Family Medicine

## 2014-10-04 LAB — COMPREHENSIVE METABOLIC PANEL
ALT: 20 U/L (ref 0–35)
AST: 14 U/L (ref 0–37)
Albumin: 3.9 g/dL (ref 3.5–5.2)
Alkaline Phosphatase: 111 U/L (ref 39–117)
BUN: 9 mg/dL (ref 6–23)
CO2: 27 mEq/L (ref 19–32)
Calcium: 9.2 mg/dL (ref 8.4–10.5)
Chloride: 106 mEq/L (ref 96–112)
Creatinine, Ser: 0.69 mg/dL (ref 0.40–1.20)
GFR: 92.46 mL/min (ref >60.00)
Glucose, Bld: 126 mg/dL — ABNORMAL HIGH (ref 70–99)
Potassium: 4.2 mEq/L (ref 3.5–5.1)
Sodium: 140 mEq/L (ref 135–145)
Total Bilirubin: 0.5 mg/dL (ref 0.2–1.2)
Total Protein: 6.7 g/dL (ref 6.0–8.3)

## 2014-10-04 MED ORDER — DAPAGLIFLOZIN PROPANEDIOL 10 MG PO TABS: 10.0000 mg | ORAL_TABLET | Freq: Every day | ORAL | Status: DC

## 2014-10-04 NOTE — Telephone Encounter (Signed)
emmi emailed °

## 2014-10-18 ENCOUNTER — Encounter: Payer: Self-pay | Admitting: Nurse Practitioner

## 2014-10-18 ENCOUNTER — Ambulatory Visit (INDEPENDENT_AMBULATORY_CARE_PROVIDER_SITE_OTHER): Payer: 59 | Admitting: Nurse Practitioner

## 2014-10-18 VITALS — BP 104/62 | HR 77 | Temp 97.7°F | Ht 66.0 in | Wt 215.0 lb

## 2014-10-18 DIAGNOSIS — B9789 Other viral agents as the cause of diseases classified elsewhere: Principal | ICD-10-CM

## 2014-10-18 DIAGNOSIS — J069 Acute upper respiratory infection, unspecified: Secondary | ICD-10-CM

## 2014-10-18 MED ORDER — BENZONATATE 200 MG PO CAPS: 200.0000 mg | ORAL_CAPSULE | Freq: Three times a day (TID) | ORAL | Status: DC | PRN

## 2014-10-18 NOTE — Patient Instructions (Signed)
You have a virus causing your symptoms. The average duration of cold symptoms is 14 days.  For nasal congestion: Start daily sinus rinses (neilmed Sinus Rinse). Use 30 mg pseudoephedrine 2 to 3 times daily. Vicks vapor rub under nose to help breathe. For cough: Take benzonatate capsules during day for cough.  Tylenol or ibuprophen for headache. Sip fluids every hour. Rest. If you are not feeling better in 10 days or develop fever or chest pain, call us for re-evaluation. Feel better!  Upper Respiratory Infection, Adult An upper respiratory infection (URI) is also sometimes known as the common cold. The upper respiratory tract includes the nose, sinuses, throat, trachea, and bronchi. Bronchi are the airways leading to the lungs. Most people improve within 1 week, but symptoms can last up to 2 weeks. A residual cough may last even longer.  CAUSES Many different viruses can infect the tissues lining the upper respiratory tract. The tissues become irritated and inflamed and often become very moist. Mucus production is also common. A cold is contagious. You can easily spread the virus to others by oral contact. This includes kissing, sharing a glass, coughing, or sneezing. Touching your mouth or nose and then touching a surface, which is then touched by another person, can also spread the virus. SYMPTOMS  Symptoms typically develop 1 to 3 days after you come in contact with a cold virus. Symptoms vary from person to person. They may include:  Runny nose.  Sneezing.  Nasal congestion.  Sinus irritation.  Sore throat.  Loss of voice (laryngitis).  Cough.  Fatigue.  Muscle aches.  Loss of appetite.  Headache.  Low-grade fever. DIAGNOSIS  You might diagnose your own cold based on familiar symptoms, since most people get a cold 2 to 3 times a year. Your caregiver can confirm this based on your exam. Most importantly, your caregiver can check that your symptoms are not due to another  disease such as strep throat, sinusitis, pneumonia, asthma, or epiglottitis. Blood tests, throat tests, and X-rays are not necessary to diagnose a common cold, but they may sometimes be helpful in excluding other more serious diseases. Your caregiver will decide if any further tests are required. RISKS AND COMPLICATIONS  You may be at risk for a more severe case of the common cold if you smoke cigarettes, have chronic heart disease (such as heart failure) or lung disease (such as asthma), or if you have a weakened immune system. The very young and very old are also at risk for more serious infections. Bacterial sinusitis, middle ear infections, and bacterial pneumonia can complicate the common cold. The common cold can worsen asthma and chronic obstructive pulmonary disease (COPD). Sometimes, these complications can require emergency medical care and may be life-threatening. PREVENTION  The best way to protect against getting a cold is to practice good hygiene. Avoid oral or hand contact with people with cold symptoms. Wash your hands often if contact occurs. There is no clear evidence that vitamin C, vitamin E, echinacea, or exercise reduces the chance of developing a cold. However, it is always recommended to get plenty of rest and practice good nutrition. TREATMENT  Treatment is directed at relieving symptoms. There is no cure. Antibiotics are not effective, because the infection is caused by a virus, not by bacteria. Treatment may include:  Increased fluid intake. Sports drinks offer valuable electrolytes, sugars, and fluids.  Breathing heated mist or steam (vaporizer or shower).  Eating chicken soup or other clear broths, and maintaining good  nutrition.  Getting plenty of rest.  Using gargles or lozenges for comfort.  Controlling fevers with ibuprofen or acetaminophen as directed by your caregiver.  Increasing usage of your inhaler if you have asthma. Zinc gel and zinc lozenges, taken in  the first 24 hours of the common cold, can shorten the duration and lessen the severity of symptoms. Pain medicines may help with fever, muscle aches, and throat pain. A variety of non-prescription medicines are available to treat congestion and runny nose. Your caregiver can make recommendations and may suggest nasal or lung inhalers for other symptoms.  HOME CARE INSTRUCTIONS   Only take over-the-counter or prescription medicines for pain, discomfort, or fever as directed by your caregiver.  Use a warm mist humidifier or inhale steam from a shower to increase air moisture. This may keep secretions moist and make it easier to breathe.  Drink enough water and fluids to keep your urine clear or pale yellow.  Rest as needed.  Return to work when your temperature has returned to normal or as your caregiver advises. You may need to stay home longer to avoid infecting others. You can also use a face mask and careful hand washing to prevent spread of the virus. SEEK MEDICAL CARE IF:   After the first few days, you feel you are getting worse rather than better.  You need your caregiver's advice about medicines to control symptoms.  You develop chills, worsening shortness of breath, or brown or red sputum. These may be signs of pneumonia.  You develop yellow or brown nasal discharge or pain in the face, especially when you bend forward. These may be signs of sinusitis.  You develop a fever, swollen neck glands, pain with swallowing, or white areas in the back of your throat. These may be signs of strep throat. SEEK IMMEDIATE MEDICAL CARE IF:   You have a fever.  You develop severe or persistent headache, ear pain, sinus pain, or chest pain.  You develop wheezing, a prolonged cough, cough up blood, or have a change in your usual mucus (if you have chronic lung disease).  You develop sore muscles or a stiff neck. Document Released: 01/22/2001 Document Revised: 10/21/2011 Document Reviewed:  11/30/2010 [name] Patient Information 2014 [place], [place].

## 2014-10-18 NOTE — Progress Notes (Signed)
Pre visit review using our clinic review tool, if applicable. No additional management support is needed unless otherwise documented below in the visit note. 

## 2014-10-18 NOTE — Progress Notes (Signed)
   Subjective:    Patient ID: Claudia [name], female    DOB: 10/17/1955, 59 y.o.   MRN: [id number]  Sinusitis This is a new problem. The current episode started in the past 7 days. The problem is unchanged. There has been no fever. Associated symptoms include congestion, coughing, headaches, sinus pressure and a sore throat. Pertinent negatives include no chills or shortness of breath. Past treatments include oral decongestants. The treatment provided mild relief.      Review of Systems  Constitutional: Negative for chills.  HENT: Positive for congestion, sinus pressure and sore throat.   Respiratory: Positive for cough. Negative for shortness of breath.   Neurological: Positive for headaches.       Objective:   Physical Exam  Constitutional: She is oriented to person, place, and time. She appears well-developed and well-nourished.  HENT:  Head: Normocephalic and atraumatic.  Right Ear: External ear normal.  Left Ear: External ear normal.  Mouth/Throat: Oropharynx is clear and moist. No oropharyngeal exudate.  TM vessels injected bilat , bones visible  Eyes: Conjunctivae are normal. Right eye exhibits no discharge. Left eye exhibits no discharge.  Neck: Normal range of motion. Neck supple. No thyromegaly present.  Cardiovascular: Normal rate, regular rhythm and normal heart sounds.   No murmur heard. Pulmonary/Chest: Effort normal and breath sounds normal. No respiratory distress. She has no wheezes. She has no rales.  Frequent cough during exam  Lymphadenopathy:    She has no cervical adenopathy.  Neurological: She is alert and oriented to person, place, and time.  Skin: Skin is warm and dry.  Psychiatric: She has a normal mood and affect. Her behavior is normal. Thought content normal.  Vitals reviewed.         Assessment & Plan:  1. Viral upper respiratory tract infection with cough Symptom supprot - benzonatate (TESSALON) 200 MG capsule; Take 1 capsule (200 mg  total) by mouth 3 (three) times daily as needed for cough.  Dispense: 60 capsule; Refill: 0 See pt instructions. F/u prn

## 2014-10-24 ENCOUNTER — Telehealth: Payer: Self-pay | Admitting: Nurse Practitioner

## 2014-10-24 ENCOUNTER — Other Ambulatory Visit: Payer: Self-pay | Admitting: Nurse Practitioner

## 2014-10-24 DIAGNOSIS — J0100 Acute maxillary sinusitis, unspecified: Secondary | ICD-10-CM

## 2014-10-24 MED ORDER — AZITHROMYCIN 250 MG PO TABS: ORAL_TABLET | ORAL | Status: DC

## 2014-10-24 NOTE — Telephone Encounter (Signed)
Called and informed patient that abx were called in to [facility].

## 2014-10-24 NOTE — Telephone Encounter (Signed)
Please Advise

## 2014-10-24 NOTE — Telephone Encounter (Signed)
I sent in azithromycin

## 2014-10-24 NOTE — Telephone Encounter (Signed)
Pt. Called and is not feeling any better. She was here a week ago and states she still has ear pain, upper respitory infection, sore throat. She is asking that Layne call her in another RX so she can feel better. [phone] pt phone calls.

## 2014-11-02 ENCOUNTER — Telehealth: Payer: Self-pay | Admitting: *Deleted

## 2014-11-02 NOTE — Telephone Encounter (Signed)
PA started for [place].

## 2014-11-03 NOTE — Telephone Encounter (Signed)
PA was denied for [place]. Med denied due to pt not having tried & failed Invokana and Jadiance x3 months. Please advise?

## 2014-11-07 ENCOUNTER — Other Ambulatory Visit: Payer: Self-pay | Admitting: Family Medicine

## 2014-11-07 MED ORDER — CANAGLIFLOZIN 100 MG PO TABS: ORAL_TABLET | ORAL | Status: DC

## 2014-11-07 NOTE — Telephone Encounter (Signed)
Pls notify pt that due to her insurance restrictions I had to change her [place] to [place]. I sent rx in to her pharmacy that she may start right away.-thx

## 2014-11-08 NOTE — Telephone Encounter (Signed)
Called and informed patient. 

## 2014-11-16 ENCOUNTER — Telehealth: Payer: Self-pay | Admitting: *Deleted

## 2014-11-16 MED ORDER — FLUCONAZOLE 150 MG PO TABS: 150.0000 mg | ORAL_TABLET | Freq: Once | ORAL | Status: DC

## 2014-11-16 NOTE — Telephone Encounter (Signed)
Has she tried OTC yeast vaginitis treatment yet?  If not, she needs to do this first. If she has already tried this, then pls eRx fluconazole 150mg , 1 tab po qd x 1 dose, #1, no RF.  Also, she needs to come and give a urine sample so we can check for signs of urine infection b/c that type of diabetes med can increase risk of urinary tract infections.  Lab visit only: POCT UA and also send it for culture (dx can be urinary frequency).-thx

## 2014-11-16 NOTE — Telephone Encounter (Signed)
Pt aware.  She has tried OTC monistat w/o relief.  She's also been on probiotics.  Rx sent.  She will come in tomorrow to leave U/A.

## 2014-11-16 NOTE — Telephone Encounter (Signed)
Patient called and left vm requesting a medication for yeast infection. Patient stated that since her diabetes med was changed she has been having back pain and now has a yeast infection. Please advise?

## 2014-11-17 ENCOUNTER — Other Ambulatory Visit (INDEPENDENT_AMBULATORY_CARE_PROVIDER_SITE_OTHER): Payer: 59

## 2014-11-17 DIAGNOSIS — R350 Frequency of micturition: Secondary | ICD-10-CM | POA: Diagnosis not present

## 2014-11-17 LAB — POCT URINALYSIS DIPSTICK
Bilirubin, UA: NEGATIVE
Glucose, UA: >=1000
Ketones, UA: NEGATIVE
Leukocytes, UA: NEGATIVE
Nitrite, UA: NEGATIVE
Protein, UA: NEGATIVE
Spec Grav, UA: 1.025
Urobilinogen, UA: 0.2
pH, UA: 5.5

## 2014-11-18 ENCOUNTER — Other Ambulatory Visit: Payer: Self-pay | Admitting: Family Medicine

## 2014-11-18 MED ORDER — SULFAMETHOXAZOLE-TRIMETHOPRIM 800-160 MG PO TABS: ORAL_TABLET | ORAL | Status: DC

## 2014-11-19 LAB — URINE CULTURE: Colony Count: >=100000

## 2014-12-21 ENCOUNTER — Other Ambulatory Visit: Payer: Self-pay | Admitting: Family Medicine

## 2015-01-13 ENCOUNTER — Encounter: Payer: Self-pay | Admitting: Family Medicine

## 2015-01-13 ENCOUNTER — Ambulatory Visit (INDEPENDENT_AMBULATORY_CARE_PROVIDER_SITE_OTHER): Payer: 59 | Admitting: Family Medicine

## 2015-01-13 VITALS — BP 96/64 | HR 90 | Temp 99.5°F | Resp 16 | Wt 213.0 lb

## 2015-01-13 DIAGNOSIS — T887XXA Unspecified adverse effect of drug or medicament, initial encounter: Secondary | ICD-10-CM

## 2015-01-13 DIAGNOSIS — J0100 Acute maxillary sinusitis, unspecified: Secondary | ICD-10-CM

## 2015-01-13 DIAGNOSIS — T50905A Adverse effect of unspecified drugs, medicaments and biological substances, initial encounter: Secondary | ICD-10-CM

## 2015-01-13 DIAGNOSIS — B373 Candidiasis of vulva and vagina: Secondary | ICD-10-CM | POA: Diagnosis not present

## 2015-01-13 DIAGNOSIS — B3731 Acute candidiasis of vulva and vagina: Secondary | ICD-10-CM

## 2015-01-13 MED ORDER — AMOXICILLIN 875 MG PO TABS: 875.0000 mg | ORAL_TABLET | Freq: Two times a day (BID) | ORAL | Status: DC

## 2015-01-13 NOTE — Progress Notes (Signed)
OFFICE NOTE  01/13/2015  CC:  Chief Complaint  Patient presents with  . URI    x 3 days  . Vaginal Itching   HPI: Patient is a 59 y.o. Caucasian female who is here for respiratory complaints. Onset of HA, nasal drainage, PND, some ear pains, all about 3 days ago.  +generalized face pain/tenderness per pt.  Cough last 24h, a little achy in body.  ST on right side only.   Chills/subj fever last night.  Had a mild presyncopal spell last night--felt better after sat down.  Gluc 140 at the time last night.  BP was not checked.  No recurrence of presyncope since last night. Drinking fluids well, solids intake is down.  No n/v/d. Nonsmoker.  Vaginal itching is an ongoing problem and she has some "cottage cheese" type d/c intermittently. Took 3d OTC vag antifungal just this most recent weekend and sx's resolved. This has been a recurrent prob since being on invokana, as has mild generalized body achiness.  Pertinent PMH:  Past medical, surgical, social, and family history reviewed and no changes are noted since last office visit.  MEDS:  Outpatient Prescriptions Prior to Visit  Medication Sig Dispense Refill  . aspirin 81 MG tablet Take 81 mg by mouth daily.    . benzonatate (TESSALON) 200 MG capsule Take 1 capsule (200 mg total) by mouth 3 (three) times daily as needed for cough. 60 capsule 0  . canagliflozin (INVOKANA) 100 MG TABS tablet 1 tab po qAM prior to first meal of the day 30 tablet 6  . fluconazole (DIFLUCAN) 150 MG tablet Take 1 tablet (150 mg total) by mouth once. Repeat if needed 1 tablet 0  . glipiZIDE (GLUCOTROL) 10 MG tablet Take 1 tablet (10 mg total) by mouth 2 (two) times daily before a meal. 60 tablet 3  . HYDROcodone-acetaminophen (NORCO/VICODIN) 5-325 MG per tablet 1-2 tabs po qhs prn pain 90 tablet 0  . lisinopril (PRINIVIL,ZESTRIL) 5 MG tablet TAKE ONE TABLET BY MOUTH EVERY DAY 30 tablet 3  . Probiotic Product (PROBIOTIC DAILY) CAPS Take by mouth. RepHresh Pro-B     . saxagliptin HCl (ONGLYZA) 5 MG TABS tablet Take 1 tablet (5 mg total) by mouth daily. 30 tablet 6  . traMADol (ULTRAM) 50 MG tablet TAKE ONE TABLET BY MOUTH EVERY 6 HOURS AS NEEDED FOR MODERATE PAIN 90 tablet 3  . VOLTAREN 1 % GEL APPLY 2 GRAMS TOPICALLY TWICE DAILY 100 g 6  . metFORMIN (GLUCOPHAGE) 500 MG tablet Take 2 tablets (1,000 mg total) by mouth 2 (two) times daily with a meal. (Patient not taking: Reported on 01/13/2015) 120 tablet 6  . methocarbamol (ROBAXIN) 500 MG tablet Take 1 tablet (500 mg total) by mouth every 6 (six) hours as needed for muscle spasms. (Patient not taking: Reported on 01/13/2015) 60 tablet 0  . sulfamethoxazole-trimethoprim (BACTRIM DS,SEPTRA DS) 800-160 MG per tablet 1 tab po bid x 3d (Patient not taking: Reported on 01/13/2015) 6 tablet 0   No facility-administered medications prior to visit.    PE: Blood pressure 96/64, pulse 90, temperature 99.5 F (37.5 C), temperature source Oral, resp. rate 16, weight 213 lb (96.616 kg), SpO2 97 %. VS: noted--normal. Gen: alert, NAD, NONTOXIC APPEARING. HEENT: eyes without injection, drainage, or swelling.  Ears: EACs clear, TMs with normal light reflex and landmarks.  Nose: Clear rhinorrhea, with some dried, crusty exudate adherent to mildly injected mucosa.  No purulent d/c.  Diffuse paranasal sinus TTP.  No facial swelling.  Throat and mouth without focal lesion.  No pharyngial swelling, erythema, or exudate.   Neck: supple, no LAD.   LUNGS: CTA bilat, nonlabored resps.   CV: RRR, no m/r/g. EXT: no c/c/e SKIN: no rash  LABS: none  IMPRESSION AND PLAN:  1) URI, severe/atypical sx's; will treat as acute bacterial sinusitis. Amoxil 875mg  bid x 10d. Trial of mucinex DM or robitussin DM otc as directed on the box. May use OTC nasal saline spray or irrigation solution bid.  2) Recurrent yeast vaginitis: not currently with sx's. This prob has been since getting started on invokana (also had UTI x 1 on this  med). Also, pt feels like she has had mild generalized body aches since getting on this med. When she gets back to her baseline health after this URI clears, she'll d/c the invokana for 1 week and see if body aches resolve.  If they do resolve then she should restart the invokana and see if the aches return. She will call with report of how this goes. Continue daily probiotic and yogurt.  An After Visit Summary was printed and given to the patient.  FOLLOW UP: prn

## 2015-01-13 NOTE — Progress Notes (Signed)
Pre visit review using our clinic review tool, if applicable. No additional management support is needed unless otherwise documented below in the visit note. 

## 2015-01-13 NOTE — Patient Instructions (Signed)
If cough gets worse: try a trial of mucinex DM or robitussin DM otc as directed on the box. May use OTC nasal saline spray or irrigation solution bid.

## 2015-01-16 ENCOUNTER — Other Ambulatory Visit: Payer: Self-pay | Admitting: Family Medicine

## 2015-01-16 NOTE — Telephone Encounter (Signed)
Rf request for tramadol.  Last OV was 02/07/15. Last RF printed 08/23/14 # 90 x 3 rfs.   Please advise.

## 2015-02-01 ENCOUNTER — Ambulatory Visit: Payer: Self-pay | Admitting: Family Medicine

## 2015-02-07 ENCOUNTER — Ambulatory Visit (INDEPENDENT_AMBULATORY_CARE_PROVIDER_SITE_OTHER): Payer: 59 | Admitting: Family Medicine

## 2015-02-07 ENCOUNTER — Encounter: Payer: Self-pay | Admitting: Family Medicine

## 2015-02-07 VITALS — BP 96/65 | HR 71 | Temp 97.9°F | Resp 16 | Ht 66.0 in | Wt 210.0 lb

## 2015-02-07 DIAGNOSIS — E119 Type 2 diabetes mellitus without complications: Secondary | ICD-10-CM | POA: Diagnosis not present

## 2015-02-07 DIAGNOSIS — G894 Chronic pain syndrome: Secondary | ICD-10-CM

## 2015-02-07 DIAGNOSIS — I10 Essential (primary) hypertension: Secondary | ICD-10-CM | POA: Diagnosis not present

## 2015-02-07 DIAGNOSIS — D649 Anemia, unspecified: Secondary | ICD-10-CM | POA: Diagnosis not present

## 2015-02-07 LAB — LIPID PANEL
Cholesterol: 150 mg/dL (ref 0–200)
HDL: 50.7 mg/dL (ref >39.00)
LDL Cholesterol: 77 mg/dL (ref 0–99)
NonHDL: 99.3
Total CHOL/HDL Ratio: 3
Triglycerides: 112 mg/dL (ref 0.0–149.0)
VLDL: 22.4 mg/dL (ref 0.0–40.0)

## 2015-02-07 LAB — CBC
HCT: 45.5 % (ref 36.0–46.0)
Hemoglobin: 15 g/dL (ref 12.0–15.0)
MCHC: 33 g/dL (ref 30.0–36.0)
MCV: 86 fl (ref 78.0–100.0)
Platelets: 263 10*3/uL (ref 150.0–400.0)
RBC: 5.3 Mil/uL — ABNORMAL HIGH (ref 3.87–5.11)
RDW: 14.9 % (ref 11.5–15.5)
WBC: 8 10*3/uL (ref 4.0–10.5)

## 2015-02-07 LAB — TSH: TSH: 4.19 u[IU]/mL (ref 0.35–4.50)

## 2015-02-07 LAB — HEMOGLOBIN A1C: Hgb A1c MFr Bld: 5.7 % (ref 4.6–6.5)

## 2015-02-07 MED ORDER — HYDROCODONE-ACETAMINOPHEN 5-325 MG PO TABS: ORAL_TABLET | ORAL | Status: DC

## 2015-02-07 NOTE — Progress Notes (Signed)
Pre visit review using our clinic review tool, if applicable. No additional management support is needed unless otherwise documented below in the visit note. 

## 2015-02-07 NOTE — Progress Notes (Signed)
OFFICE NOTE  02/07/2015  CC:  Chief Complaint  Patient presents with  . Follow-up    4 month f/u. Pt is fasting.     HPI: Patient is a 59 y.o. Caucasian female who is here for 4 mo f/u DM 2, HTN, chronic Bilat knee pain and LBP/DDD requiring chronic narcotic pain med for functioning. Typically takes 1-2 vicodin at night, takes 1 tramadol every morning.  She goes to work daily.  DM 2: fasting 135-140.  2H PP 175 avg (highest 194).  No hypoglycemia.  No home bp monitoring being done.  No generalized weakness and no orthostatic sx's at all.   Pertinent PMH:  Past medical, surgical, social, and family history reviewed and no changes are noted since last office visit.  MEDS:  Outpatient Prescriptions Prior to Visit  Medication Sig Dispense Refill  . aspirin 81 MG tablet Take 81 mg by mouth daily.    . canagliflozin (INVOKANA) 100 MG TABS tablet 1 tab po qAM prior to first meal of the day 30 tablet 6  . glipiZIDE (GLUCOTROL) 10 MG tablet Take 1 tablet (10 mg total) by mouth 2 (two) times daily before a meal. 60 tablet 3  . HYDROcodone-acetaminophen (NORCO/VICODIN) 5-325 MG per tablet 1-2 tabs po qhs prn pain 90 tablet 0  . lisinopril (PRINIVIL,ZESTRIL) 5 MG tablet TAKE ONE TABLET BY MOUTH EVERY DAY 30 tablet 3  . metFORMIN (GLUCOPHAGE) 500 MG tablet Take 2 tablets (1,000 mg total) by mouth 2 (two) times daily with a meal. 120 tablet 6  . Probiotic Product (PROBIOTIC DAILY) CAPS Take by mouth. RepHresh Pro-B    . saxagliptin HCl (ONGLYZA) 5 MG TABS tablet Take 1 tablet (5 mg total) by mouth daily. 30 tablet 6  . traMADol (ULTRAM) 50 MG tablet TAKE ONE TABLET BY MOUTH EVERY 6 HOURS AS NEEDED FOR MODERATE PAIN 90 tablet 0  . VOLTAREN 1 % GEL APPLY 2 GRAMS TOPICALLY TWICE DAILY 100 g 6  . methocarbamol (ROBAXIN) 500 MG tablet Take 1 tablet (500 mg total) by mouth every 6 (six) hours as needed for muscle spasms. (Patient not taking: Reported on 01/13/2015) 60 tablet 0  . amoxicillin (AMOXIL)  875 MG tablet Take 1 tablet (875 mg total) by mouth 2 (two) times daily. (Patient not taking: Reported on 02/07/2015) 20 tablet 0  . benzonatate (TESSALON) 200 MG capsule Take 1 capsule (200 mg total) by mouth 3 (three) times daily as needed for cough. (Patient not taking: Reported on 02/07/2015) 60 capsule 0  . fluconazole (DIFLUCAN) 150 MG tablet Take 1 tablet (150 mg total) by mouth once. Repeat if needed (Patient not taking: Reported on 02/07/2015) 1 tablet 0  . sulfamethoxazole-trimethoprim (BACTRIM DS,SEPTRA DS) 800-160 MG per tablet 1 tab po bid x 3d (Patient not taking: Reported on 01/13/2015) 6 tablet 0   No facility-administered medications prior to visit.    PE: Blood pressure 96/65, pulse 71, temperature 97.9 F (36.6 C), temperature source Oral, resp. rate 16, height 5\' 6"  (1.676 m), weight 210 lb (95.255 kg), SpO2 100 %. Gen: Alert, well appearing.  Patient is oriented to person, place, time, and situation. AFFECT: pleasant, lucid thought and speech. CV: RRR, no m/r/g.   LUNGS: CTA bilat, nonlabored resps, good aeration in all lung fields. EXT: no clubbing, cyanosis, or edema.   LABS:   Lab Results  Component Value Date   WBC 9.8 07/05/2013   HGB 10.7* 07/05/2013   HCT 31.7* 07/05/2013   MCV 89.8 07/05/2013  PLT 191 07/05/2013   Lab Results  Component Value Date   CREATININE 0.69 10/03/2014   BUN 9 10/03/2014   NA 140 10/03/2014   K 4.2 10/03/2014   CL 106 10/03/2014   CO2 27 10/03/2014   Lab Results  Component Value Date   ALT 20 10/03/2014   AST 14 10/03/2014   ALKPHOS 111 10/03/2014   BILITOT 0.5 10/03/2014   Lab Results  Component Value Date   CHOL 168 02/15/2014   Lab Results  Component Value Date   HDL 48.20 02/15/2014   Lab Results  Component Value Date   LDLCALC 95 02/15/2014   Lab Results  Component Value Date   TRIG 124.0 02/15/2014   Lab Results  Component Value Date   CHOLHDL 3 02/15/2014    IMPRESSION AND PLAN:  1) DM 2, The  current medical regimen is effective;  continue present plan and medications. HbA1c today. FLP and TSH today.  2) HTN; bp low to low-normal, asymptomatic. Continue low dose ACE-I. Lytes/cr good 4 mo ago.  3) Chronic osteoarthritic pain requiring narcotic pain med for functioning:  The current medical regimen is effective;  continue present plan and medications. RF'd vicodin 5/325, 1-2 q6h prn, #90--this should last her 2-3 mo or so.  An After Visit Summary was printed and given to the patient.  FOLLOW UP: 3 mo

## 2015-02-08 ENCOUNTER — Other Ambulatory Visit: Payer: Self-pay | Admitting: Family Medicine

## 2015-02-09 NOTE — Telephone Encounter (Signed)
Rf request for tramadol.  Last RX 01/16/15 # 90 no rf.   Please advise.

## 2015-02-24 ENCOUNTER — Telehealth: Payer: Self-pay | Admitting: *Deleted

## 2015-02-24 MED ORDER — FLUCONAZOLE 150 MG PO TABS: 150.0000 mg | ORAL_TABLET | Freq: Once | ORAL | Status: DC

## 2015-02-24 NOTE — Telephone Encounter (Signed)
Pt advised and voiced understanding.   

## 2015-02-24 NOTE — Telephone Encounter (Signed)
Pt called and wants to know if Dr. Anitra Lauth will call in something for a yeast infection. She states that she feels like she is starting to have a yeast infection and would like something to clear this up soon because she is about to go on vacation. She states that she is having some itching, burning and white discharge.  [facility]: [facility]. Please advise Thanks.

## 2015-02-24 NOTE — Telephone Encounter (Signed)
Diflucan rx sent to pharmacy.

## 2015-04-20 ENCOUNTER — Telehealth: Payer: Self-pay | Admitting: *Deleted

## 2015-04-20 MED ORDER — CIPROFLOXACIN HCL 500 MG PO TABS: 500.0000 mg | ORAL_TABLET | Freq: Two times a day (BID) | ORAL | Status: DC

## 2015-04-20 NOTE — Telephone Encounter (Signed)
Pt LMOM on 04/20/15 at 1:50pm.   Pt stated that she thinks that she has a UTI or yeast infection and would like something called in. I spoke to pt and she stated that she is having burning when she urinates, lower back pain, frequency, urgency and no discharge. She stated that she has had these symptoms for 3-4 days. I advised pt that she may need ov, she stated that she is really busy at work this week (works at [employer] has three funerals and one wedding to get ready for this weekend) and is unable to come in. Pharm: [facility]. Please advise.

## 2015-04-20 NOTE — Telephone Encounter (Signed)
Left detailed message on cell vm, okay per DPR.  

## 2015-04-20 NOTE — Telephone Encounter (Signed)
OK, I sent in cipro rx. Pt needs to come in if still having sx's when abx finished (5 days).-thx

## 2015-05-08 ENCOUNTER — Other Ambulatory Visit: Payer: Self-pay | Admitting: *Deleted

## 2015-05-08 MED ORDER — LISINOPRIL 5 MG PO TABS: 5.0000 mg | ORAL_TABLET | Freq: Every day | ORAL | Status: DC

## 2015-05-08 MED ORDER — SAXAGLIPTIN HCL 5 MG PO TABS: 5.0000 mg | ORAL_TABLET | Freq: Every day | ORAL | Status: DC

## 2015-05-08 MED ORDER — CANAGLIFLOZIN 100 MG PO TABS: ORAL_TABLET | ORAL | Status: DC

## 2015-05-08 NOTE — Telephone Encounter (Signed)
LOV: 02/07/15 NOV: None  RF request for invokana Last written: 11/07/14 #30 w/ 6RF  RF request for lisinopril Last written: 12/21/14 #30 w/ 3RF  RF request for onglyza Last written: 09/26/13 #30 w/ 6RF

## 2015-05-12 ENCOUNTER — Ambulatory Visit (INDEPENDENT_AMBULATORY_CARE_PROVIDER_SITE_OTHER): Payer: 59 | Admitting: Family Medicine

## 2015-05-12 ENCOUNTER — Encounter: Payer: Self-pay | Admitting: Family Medicine

## 2015-05-12 VITALS — BP 112/73 | HR 75 | Temp 98.1°F | Resp 16 | Ht 66.0 in | Wt 211.0 lb

## 2015-05-12 DIAGNOSIS — L989 Disorder of the skin and subcutaneous tissue, unspecified: Secondary | ICD-10-CM | POA: Diagnosis not present
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
## 2015-05-12 NOTE — Progress Notes (Signed)
Pre visit review using our clinic review tool, if applicable. No additional management support is needed unless otherwise documented below in the visit note. 

## 2015-05-15 ENCOUNTER — Other Ambulatory Visit: Payer: Self-pay | Admitting: *Deleted

## 2015-05-15 MED ORDER — METFORMIN HCL 500 MG PO TABS: 1000.0000 mg | ORAL_TABLET | Freq: Two times a day (BID) | ORAL | Status: DC

## 2015-05-15 NOTE — Telephone Encounter (Signed)
RF request for metformin LOV: 02/07/15 Next ov: 05/19/15 Last written: 09/26/14 #120 w/ 6RF

## 2015-05-19 ENCOUNTER — Encounter: Payer: Self-pay | Admitting: Family Medicine

## 2015-05-19 ENCOUNTER — Ambulatory Visit (INDEPENDENT_AMBULATORY_CARE_PROVIDER_SITE_OTHER): Payer: 59 | Admitting: Family Medicine

## 2015-05-19 VITALS — BP 126/82 | HR 78 | Temp 98.2°F | Resp 18 | Wt 210.0 lb

## 2015-05-19 DIAGNOSIS — G894 Chronic pain syndrome: Secondary | ICD-10-CM | POA: Diagnosis not present

## 2015-05-19 DIAGNOSIS — E119 Type 2 diabetes mellitus without complications: Secondary | ICD-10-CM | POA: Diagnosis not present

## 2015-05-19 DIAGNOSIS — Z23 Encounter for immunization: Secondary | ICD-10-CM

## 2015-05-19 DIAGNOSIS — I10 Essential (primary) hypertension: Secondary | ICD-10-CM | POA: Diagnosis not present

## 2015-05-19 LAB — MICROALBUMIN / CREATININE URINE RATIO
Creatinine,U: 88.4 mg/dL
Microalb Creat Ratio: 0.8 mg/g (ref 0.0–30.0)
Microalb, Ur: <0.7 mg/dL (ref 0.0–1.9)

## 2015-05-19 LAB — BASIC METABOLIC PANEL
BUN: 11 mg/dL (ref 6–23)
CO2: 28 mEq/L (ref 19–32)
Calcium: 9.6 mg/dL (ref 8.4–10.5)
Chloride: 101 mEq/L (ref 96–112)
Creatinine, Ser: 0.76 mg/dL (ref 0.40–1.20)
GFR: 82.52 mL/min (ref >60.00)
Glucose, Bld: 158 mg/dL — ABNORMAL HIGH (ref 70–99)
Potassium: 4.2 mEq/L (ref 3.5–5.1)
Sodium: 138 mEq/L (ref 135–145)

## 2015-05-19 LAB — HEMOGLOBIN A1C: Hgb A1c MFr Bld: 6.4 % (ref 4.6–6.5)

## 2015-05-19 MED ORDER — HYDROCODONE-ACETAMINOPHEN 5-325 MG PO TABS: ORAL_TABLET | ORAL | Status: DC

## 2015-05-19 MED ORDER — GLIPIZIDE 10 MG PO TABS: 10.0000 mg | ORAL_TABLET | Freq: Two times a day (BID) | ORAL | Status: DC
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
## 2015-05-19 NOTE — Progress Notes (Signed)
Pre visit review using our clinic review tool, if applicable. No additional management support is needed unless otherwise documented below in the visit note. 

## 2015-07-25 ENCOUNTER — Other Ambulatory Visit: Payer: Self-pay | Admitting: *Deleted

## 2015-07-25 MED ORDER — FLUCONAZOLE 150 MG PO TABS: 150.0000 mg | ORAL_TABLET | Freq: Once | ORAL | Status: DC

## 2015-07-25 NOTE — Telephone Encounter (Signed)
RF request for fluconazole LOV: 05/19/15 Next ov: None Last written: 02/24/15 #1 w/ 1RF  Please advise. Thanks.

## 2015-08-11 ENCOUNTER — Ambulatory Visit (INDEPENDENT_AMBULATORY_CARE_PROVIDER_SITE_OTHER): Payer: Self-pay | Admitting: Family Medicine

## 2015-08-11 ENCOUNTER — Encounter: Payer: Self-pay | Admitting: Family Medicine

## 2015-08-11 VITALS — BP 140/81 | HR 64 | Temp 97.5°F | Resp 16 | Ht 66.0 in | Wt 211.0 lb

## 2015-08-11 DIAGNOSIS — E119 Type 2 diabetes mellitus without complications: Secondary | ICD-10-CM

## 2015-08-11 DIAGNOSIS — G894 Chronic pain syndrome: Secondary | ICD-10-CM

## 2015-08-11 DIAGNOSIS — I10 Essential (primary) hypertension: Secondary | ICD-10-CM

## 2015-08-11 MED ORDER — HYDROCODONE-ACETAMINOPHEN 5-325 MG PO TABS: ORAL_TABLET | ORAL | Status: DC

## 2015-08-11 NOTE — Progress Notes (Signed)
Pre visit review using our clinic review tool, if applicable. No additional management support is needed unless otherwise documented below in the visit note. 
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
## 2015-11-17 ENCOUNTER — Ambulatory Visit (INDEPENDENT_AMBULATORY_CARE_PROVIDER_SITE_OTHER): Payer: BLUE CROSS/BLUE SHIELD | Admitting: Family Medicine

## 2015-11-17 ENCOUNTER — Encounter: Payer: Self-pay | Admitting: Family Medicine

## 2015-11-17 VITALS — BP 112/78 | HR 78 | Temp 97.8°F | Resp 16 | Ht 66.0 in | Wt 203.2 lb

## 2015-11-17 DIAGNOSIS — I10 Essential (primary) hypertension: Secondary | ICD-10-CM | POA: Diagnosis not present

## 2015-11-17 DIAGNOSIS — E119 Type 2 diabetes mellitus without complications: Secondary | ICD-10-CM | POA: Diagnosis not present

## 2015-11-17 DIAGNOSIS — G894 Chronic pain syndrome: Secondary | ICD-10-CM | POA: Diagnosis not present

## 2015-11-17 DIAGNOSIS — M170 Bilateral primary osteoarthritis of knee: Secondary | ICD-10-CM

## 2015-11-17 LAB — LIPID PANEL
Cholesterol: 149 mg/dL (ref 0–200)
HDL: 55 mg/dL (ref >39.00)
LDL Cholesterol: 78 mg/dL (ref 0–99)
NonHDL: 94.04
Total CHOL/HDL Ratio: 3
Triglycerides: 80 mg/dL (ref 0.0–149.0)
VLDL: 16 mg/dL (ref 0.0–40.0)

## 2015-11-17 LAB — COMPREHENSIVE METABOLIC PANEL
ALT: 13 U/L (ref 0–35)
AST: 11 U/L (ref 0–37)
Albumin: 4.3 g/dL (ref 3.5–5.2)
Alkaline Phosphatase: 118 U/L — ABNORMAL HIGH (ref 39–117)
BUN: 9 mg/dL (ref 6–23)
CO2: 25 mEq/L (ref 19–32)
Calcium: 9.8 mg/dL (ref 8.4–10.5)
Chloride: 104 mEq/L (ref 96–112)
Creatinine, Ser: 0.65 mg/dL (ref 0.40–1.20)
GFR: 98.68 mL/min (ref >60.00)
Glucose, Bld: 111 mg/dL — ABNORMAL HIGH (ref 70–99)
Potassium: 3.9 mEq/L (ref 3.5–5.1)
Sodium: 138 mEq/L (ref 135–145)
Total Bilirubin: 0.6 mg/dL (ref 0.2–1.2)
Total Protein: 7.3 g/dL (ref 6.0–8.3)

## 2015-11-17 LAB — HEMOGLOBIN A1C: Hgb A1c MFr Bld: 6.1 % (ref 4.6–6.5)

## 2015-11-17 MED ORDER — TRAMADOL HCL 50 MG PO TABS: ORAL_TABLET | ORAL | Status: DC

## 2015-11-17 MED ORDER — HYDROCODONE-ACETAMINOPHEN 5-325 MG PO TABS: ORAL_TABLET | ORAL | Status: DC
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
## 2015-11-17 NOTE — Progress Notes (Signed)
Pre visit review using our clinic review tool, if applicable. No additional management support is needed unless otherwise documented below in the visit note. 

## 2015-12-18 ENCOUNTER — Other Ambulatory Visit: Payer: Self-pay | Admitting: Family Medicine

## 2015-12-18 ENCOUNTER — Encounter: Payer: Self-pay | Admitting: Family Medicine

## 2016-01-05 ENCOUNTER — Other Ambulatory Visit: Payer: Self-pay | Admitting: *Deleted

## 2016-01-05 MED ORDER — LISINOPRIL 5 MG PO TABS: 5.0000 mg | ORAL_TABLET | Freq: Every day | ORAL | Status: DC

## 2016-01-05 MED ORDER — METFORMIN HCL 500 MG PO TABS: 1000.0000 mg | ORAL_TABLET | Freq: Two times a day (BID) | ORAL | Status: DC

## 2016-01-05 MED ORDER — HYDROCODONE-ACETAMINOPHEN 5-325 MG PO TABS: ORAL_TABLET | ORAL | Status: DC

## 2016-01-05 MED ORDER — GLIPIZIDE 10 MG PO TABS: 10.0000 mg | ORAL_TABLET | Freq: Two times a day (BID) | ORAL | Status: DC

## 2016-01-05 NOTE — Telephone Encounter (Signed)
Pt LMOM on 01/05/16 at 3:02pm requesting a refill  RF request for hydro/apap LOV: 11/17/15 Next ov: None Last written: 11/17/15 #90 w/ 0RF  Please advise. Thanks.

## 2016-01-05 NOTE — Telephone Encounter (Signed)
LOV: 11/17/15 NOV: None  RF request for gluipizide Last written: 05/19/15 #60 w/ 6RF  RF request for lisinopril Last written: 05/08/15 #30 w/ 6RF  RF request for metformin Last written: 05/15/15 #120 w/ 6RF

## 2016-01-09 NOTE — Telephone Encounter (Signed)
Rx put up front for p/u. Pt advised and voiced understanding.   

## 2016-04-10 ENCOUNTER — Telehealth: Payer: Self-pay

## 2016-04-10 MED ORDER — HYDROCODONE-ACETAMINOPHEN 5-325 MG PO TABS
ORAL_TABLET | ORAL | 0 refills | Status: DC
Start: 2016-04-10 — End: 2016-06-19

## 2016-04-10 NOTE — Telephone Encounter (Signed)
lmom for pt to pick up rx at the clinic

## 2016-04-10 NOTE — Telephone Encounter (Signed)
Vicodin rx printed. 

## 2016-04-10 NOTE — Telephone Encounter (Signed)
Would like a refill on her hydrocodone medication

## 2016-04-16 ENCOUNTER — Telehealth: Payer: Self-pay | Admitting: Family Medicine

## 2016-04-16 MED ORDER — TRAMADOL HCL 50 MG PO TABS: ORAL_TABLET | ORAL | 5 refills | Status: DC

## 2016-04-16 NOTE — Telephone Encounter (Signed)
Pt requesting RF of tramadol. Last OV 11/17/15. No upcoming appt Last RF 11/17/15 # 90 x 5 rfs.   Please advise.

## 2016-04-16 NOTE — Telephone Encounter (Signed)
Patient notified  and understood rx faxed to pharmacy.

## 2016-04-16 NOTE — Telephone Encounter (Signed)
Tramadol rx printed.

## 2016-04-16 NOTE — Telephone Encounter (Signed)
Rx faxed

## 2016-05-20 ENCOUNTER — Ambulatory Visit: Payer: BLUE CROSS/BLUE SHIELD

## 2016-05-24 ENCOUNTER — Ambulatory Visit: Payer: BLUE CROSS/BLUE SHIELD

## 2016-05-29 ENCOUNTER — Ambulatory Visit (INDEPENDENT_AMBULATORY_CARE_PROVIDER_SITE_OTHER): Payer: Self-pay

## 2016-05-29 DIAGNOSIS — Z23 Encounter for immunization: Secondary | ICD-10-CM

## 2016-06-19 ENCOUNTER — Telehealth: Payer: Self-pay | Admitting: Family Medicine

## 2016-06-19 MED ORDER — HYDROCODONE-ACETAMINOPHEN 5-325 MG PO TABS: ORAL_TABLET | ORAL | 0 refills | Status: DC

## 2016-06-19 NOTE — Telephone Encounter (Signed)
done

## 2016-06-19 NOTE — Telephone Encounter (Signed)
Rf request for hydrocodone.   Last RF 04/10/16.  Please send advise.

## 2016-06-19 NOTE — Telephone Encounter (Signed)
Rx is at the front desk, patient is aware.

## 2016-06-19 NOTE — Telephone Encounter (Signed)
Vicodin rx printed. 

## 2016-06-25 ENCOUNTER — Encounter: Payer: Self-pay | Admitting: Family Medicine

## 2016-06-25 ENCOUNTER — Ambulatory Visit (INDEPENDENT_AMBULATORY_CARE_PROVIDER_SITE_OTHER): Payer: Self-pay | Admitting: Family Medicine

## 2016-06-25 VITALS — BP 132/81 | HR 73 | Temp 98.4°F | Resp 18 | Wt 211.0 lb

## 2016-06-25 DIAGNOSIS — J069 Acute upper respiratory infection, unspecified: Secondary | ICD-10-CM

## 2016-06-25 DIAGNOSIS — B9789 Other viral agents as the cause of diseases classified elsewhere: Secondary | ICD-10-CM

## 2016-06-25 DIAGNOSIS — J209 Acute bronchitis, unspecified: Secondary | ICD-10-CM

## 2016-06-25 MED ORDER — PREDNISONE 20 MG PO TABS: ORAL_TABLET | ORAL | 0 refills | Status: DC

## 2016-06-25 MED ORDER — HYDROCODONE-HOMATROPINE 5-1.5 MG/5ML PO SYRP: ORAL_SOLUTION | ORAL | 0 refills | Status: DC

## 2016-06-25 NOTE — Patient Instructions (Signed)
Get otc generic robitussin DM OR Mucinex DM and use as directed on the packaging for cough and congestion. Use otc generic saline nasal spray 2-3 times per day to irrigate/moisturize your nasal passages.   
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
## 2016-06-25 NOTE — Progress Notes (Signed)
Pre visit review using our clinic review tool, if applicable. No additional management support is needed unless otherwise documented below in the visit note. 

## 2016-07-15 ENCOUNTER — Encounter: Payer: Self-pay | Admitting: Family Medicine

## 2016-07-15 ENCOUNTER — Ambulatory Visit (INDEPENDENT_AMBULATORY_CARE_PROVIDER_SITE_OTHER): Payer: Self-pay | Admitting: Family Medicine

## 2016-07-15 VITALS — BP 159/72 | HR 87 | Temp 97.8°F | Resp 20 | Wt 209.8 lb

## 2016-07-15 DIAGNOSIS — L239 Allergic contact dermatitis, unspecified cause: Secondary | ICD-10-CM

## 2016-07-15 MED ORDER — METHYLPREDNISOLONE ACETATE 80 MG/ML IJ SUSP
80.0000 mg | Freq: Once | INTRAMUSCULAR | Status: AC
  Administered 2016-07-15: 80 mg via INTRAMUSCULAR
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
## 2016-07-15 NOTE — Patient Instructions (Signed)
It appears you are having an allergic reaction, possibly to your new laundry detergent, return to your old detergent.  Start allegra (over the counter) to help and the steroid shot today will also help calm it down.

## 2016-07-16 ENCOUNTER — Telehealth: Payer: Self-pay | Admitting: Family Medicine

## 2016-07-16 NOTE — Telephone Encounter (Signed)
Patient requesting Rx cream or something to help with the itching on her arms.   Pt uses [employer] [facility]. Please advise.

## 2016-07-16 NOTE — Telephone Encounter (Signed)
Patient notified and verbalized understanding.  She will try eucerin and will call back if no improvement or worsening.

## 2016-07-16 NOTE — Telephone Encounter (Signed)
I can only recommend OTC moisturizing creams like eucerin or lubriderm--apply 2-3 times a day. Any prescription cream would need o/v so I could look at her arms and see if anything further is indicated or not.--thx

## 2016-07-30 ENCOUNTER — Telehealth: Payer: Self-pay | Admitting: *Deleted

## 2016-07-30 ENCOUNTER — Other Ambulatory Visit: Payer: Self-pay | Admitting: Family Medicine

## 2016-07-30 MED ORDER — FLUCONAZOLE 150 MG PO TABS: 150.0000 mg | ORAL_TABLET | Freq: Once | ORAL | 0 refills | Status: AC

## 2016-07-30 NOTE — Telephone Encounter (Signed)
Pt called stating that she would like a Rx for diflucan sent to [facility]. She stated that she seen Dr. Anitra Lauth on 06/25/16 and was given prednisone, then seen Dr. Raoul Pitch on 07/15/16 and was given steroid injection. She stated that she is now having symptoms of yeast infection.   SW Dr. Raoul Pitch who agreed to send in Rx for diflucan 150mg  take 1 tablet once #1 w/ 0RF. Rx sent as directed.

## 2016-08-30 ENCOUNTER — Other Ambulatory Visit: Payer: Self-pay | Admitting: Family Medicine

## 2016-09-09 ENCOUNTER — Encounter: Payer: Self-pay | Admitting: Family Medicine

## 2016-09-09 ENCOUNTER — Ambulatory Visit (INDEPENDENT_AMBULATORY_CARE_PROVIDER_SITE_OTHER): Payer: BLUE CROSS/BLUE SHIELD | Admitting: Family Medicine

## 2016-09-09 ENCOUNTER — Other Ambulatory Visit: Payer: Self-pay | Admitting: Family Medicine

## 2016-09-09 VITALS — BP 92/68 | HR 72 | Temp 98.2°F | Resp 16 | Ht 66.0 in | Wt 213.2 lb

## 2016-09-09 DIAGNOSIS — B9789 Other viral agents as the cause of diseases classified elsewhere: Secondary | ICD-10-CM | POA: Diagnosis not present

## 2016-09-09 DIAGNOSIS — J209 Acute bronchitis, unspecified: Secondary | ICD-10-CM

## 2016-09-09 DIAGNOSIS — J069 Acute upper respiratory infection, unspecified: Secondary | ICD-10-CM

## 2016-09-09 MED ORDER — HYDROCODONE-HOMATROPINE 5-1.5 MG/5ML PO SYRP: ORAL_SOLUTION | ORAL | 0 refills | Status: DC

## 2016-09-09 MED ORDER — PREDNISONE 20 MG PO TABS: ORAL_TABLET | ORAL | 0 refills | Status: DC

## 2016-09-09 MED ORDER — HYDROCODONE-ACETAMINOPHEN 5-325 MG PO TABS: ORAL_TABLET | ORAL | 0 refills | Status: DC

## 2016-09-09 NOTE — Patient Instructions (Signed)
Get otc generic robitussin DM OR Mucinex DM and use as directed on the packaging for cough and congestion. Use otc generic saline nasal spray 2-3 times per day to irrigate/moisturize your nasal passages.   

## 2016-09-09 NOTE — Telephone Encounter (Signed)
Pt was seen today for acute visit and stated that she has been off the invokana since she has not had insurance but wants to get back on it now she she has insurance.   RF request for Invokana LOV: 11/17/15 Next ov: None Last written: 05/08/15 #30 w/ 6RF  Please advise. Thanks.

## 2016-09-09 NOTE — Progress Notes (Signed)
Pre visit review using our clinic review tool, if applicable. No additional management support is needed unless otherwise documented below in the visit note. 
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
## 2016-09-12 ENCOUNTER — Encounter: Payer: Self-pay | Admitting: Family Medicine

## 2016-09-12 ENCOUNTER — Ambulatory Visit (INDEPENDENT_AMBULATORY_CARE_PROVIDER_SITE_OTHER): Payer: BLUE CROSS/BLUE SHIELD | Admitting: Family Medicine

## 2016-09-12 VITALS — BP 113/70 | HR 75 | Temp 98.6°F | Resp 16 | Wt 213.0 lb

## 2016-09-12 DIAGNOSIS — J209 Acute bronchitis, unspecified: Secondary | ICD-10-CM | POA: Diagnosis not present

## 2016-09-12 DIAGNOSIS — J0100 Acute maxillary sinusitis, unspecified: Secondary | ICD-10-CM | POA: Diagnosis not present

## 2016-09-12 MED ORDER — AMOXICILLIN-POT CLAVULANATE 875-125 MG PO TABS: 1.0000 | ORAL_TABLET | Freq: Two times a day (BID) | ORAL | 0 refills | Status: DC

## 2016-09-12 MED ORDER — ALBUTEROL SULFATE HFA 108 (90 BASE) MCG/ACT IN AERS: 1.0000 | INHALATION_SPRAY | RESPIRATORY_TRACT | 0 refills | Status: DC | PRN
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
## 2016-09-17 ENCOUNTER — Other Ambulatory Visit: Payer: Self-pay | Admitting: Family Medicine

## 2016-09-17 ENCOUNTER — Telehealth: Payer: Self-pay | Admitting: *Deleted

## 2016-09-17 MED ORDER — HYDROCODONE-HOMATROPINE 5-1.5 MG/5ML PO SYRP: ORAL_SOLUTION | ORAL | 0 refills | Status: DC

## 2016-09-17 NOTE — Telephone Encounter (Signed)
Patient calling to see if she can get a refill on her cough medicine.  She states that she has been seen a few times lately, and she is almost out of cough medicine, but her cough is still really bad at night.

## 2016-09-17 NOTE — Telephone Encounter (Signed)
Please advise. Thanks.  

## 2016-09-17 NOTE — Telephone Encounter (Signed)
Hycodan rx printed.

## 2016-09-18 NOTE — Telephone Encounter (Signed)
Rx put up front for p/u. Pt advised and voiced understanding.   

## 2016-09-23 ENCOUNTER — Telehealth: Payer: Self-pay | Admitting: Family Medicine

## 2016-09-23 MED ORDER — FLUCONAZOLE 150 MG PO TABS: ORAL_TABLET | ORAL | 0 refills | Status: DC

## 2016-09-23 NOTE — Telephone Encounter (Signed)
Pt advised and voiced understanding.  She also requested Rx for mouth wash, SW Dr. Anitra Lauth and he stated that the fluconazole with take care of both the thrush and yeast infection. Pt advised and voiced understanding.

## 2016-09-23 NOTE — Telephone Encounter (Signed)
Please advise. Thanks.  

## 2016-09-23 NOTE — Telephone Encounter (Signed)
Fluconazole eRx'd as per pt request. 

## 2016-09-23 NOTE — Telephone Encounter (Signed)
Patient called in to advise that she has a yeast infection and thrush in her mouth from the abx prescribed in previous visits. She is asking if something can be called in to the [facility] on file.

## 2016-10-14 ENCOUNTER — Encounter: Payer: Self-pay | Admitting: Family Medicine

## 2016-10-14 ENCOUNTER — Ambulatory Visit (INDEPENDENT_AMBULATORY_CARE_PROVIDER_SITE_OTHER): Payer: BLUE CROSS/BLUE SHIELD | Admitting: Family Medicine

## 2016-10-14 VITALS — BP 100/67 | HR 78 | Temp 98.0°F | Resp 16 | Ht 66.0 in | Wt 211.2 lb

## 2016-10-14 DIAGNOSIS — L03032 Cellulitis of left toe: Secondary | ICD-10-CM | POA: Diagnosis not present

## 2016-10-14 DIAGNOSIS — M722 Plantar fascial fibromatosis: Secondary | ICD-10-CM | POA: Diagnosis not present
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
## 2016-10-14 NOTE — Progress Notes (Signed)
Pre visit review using our clinic review tool, if applicable. No additional management support is needed unless otherwise documented below in the visit note. 

## 2016-10-14 NOTE — Patient Instructions (Signed)
Soak your left foot (great toe) for 20 minutes in warm epsom salt bath.  Do this twice per day.  Buy otc 1/4 inch heel lift for left foot.   Take 3 otc ibuprofen (200 mg each) twice per day with food.  If not significantly improving in 10-14 days then return.

## 2016-10-18 ENCOUNTER — Other Ambulatory Visit: Payer: Self-pay | Admitting: Family Medicine

## 2016-10-21 NOTE — Telephone Encounter (Signed)
I did 30 day RF of her glipizide, metformin, and tramadol. She needs f/u DM and pain sometime in the next 30d.-thx

## 2016-10-22 NOTE — Telephone Encounter (Signed)
Pt advised and voiced understanding.  Apt made for 11/15/16 at 8:00am. Rx faxed.

## 2016-11-15 ENCOUNTER — Encounter: Payer: Self-pay | Admitting: Family Medicine

## 2016-11-15 ENCOUNTER — Ambulatory Visit (INDEPENDENT_AMBULATORY_CARE_PROVIDER_SITE_OTHER): Payer: BLUE CROSS/BLUE SHIELD | Admitting: Family Medicine

## 2016-11-15 VITALS — BP 119/63 | HR 71 | Temp 98.2°F | Resp 16 | Ht 66.0 in | Wt 211.8 lb

## 2016-11-15 DIAGNOSIS — E119 Type 2 diabetes mellitus without complications: Secondary | ICD-10-CM

## 2016-11-15 DIAGNOSIS — Z1231 Encounter for screening mammogram for malignant neoplasm of breast: Secondary | ICD-10-CM

## 2016-11-15 DIAGNOSIS — E7800 Pure hypercholesterolemia, unspecified: Secondary | ICD-10-CM

## 2016-11-15 DIAGNOSIS — Z1239 Encounter for other screening for malignant neoplasm of breast: Secondary | ICD-10-CM

## 2016-11-15 DIAGNOSIS — I10 Essential (primary) hypertension: Secondary | ICD-10-CM

## 2016-11-15 DIAGNOSIS — G894 Chronic pain syndrome: Secondary | ICD-10-CM

## 2016-11-15 LAB — COMPREHENSIVE METABOLIC PANEL
ALT: 18 U/L (ref 0–35)
AST: 13 U/L (ref 0–37)
Albumin: 4 g/dL (ref 3.5–5.2)
Alkaline Phosphatase: 121 U/L — ABNORMAL HIGH (ref 39–117)
BUN: 8 mg/dL (ref 6–23)
CO2: 30 mEq/L (ref 19–32)
Calcium: 9.1 mg/dL (ref 8.4–10.5)
Chloride: 103 mEq/L (ref 96–112)
Creatinine, Ser: 0.65 mg/dL (ref 0.40–1.20)
GFR: 98.35 mL/min (ref >60.00)
Glucose, Bld: 155 mg/dL — ABNORMAL HIGH (ref 70–99)
Potassium: 3.8 mEq/L (ref 3.5–5.1)
Sodium: 139 mEq/L (ref 135–145)
Total Bilirubin: 0.7 mg/dL (ref 0.2–1.2)
Total Protein: 6.8 g/dL (ref 6.0–8.3)

## 2016-11-15 LAB — LIPID PANEL
Cholesterol: 145 mg/dL (ref 0–200)
HDL: 53.7 mg/dL (ref >39.00)
LDL Cholesterol: 78 mg/dL (ref 0–99)
NonHDL: 91.15
Total CHOL/HDL Ratio: 3
Triglycerides: 66 mg/dL (ref 0.0–149.0)
VLDL: 13.2 mg/dL (ref 0.0–40.0)

## 2016-11-15 LAB — HEMOGLOBIN A1C: Hgb A1c MFr Bld: 8.2 % — ABNORMAL HIGH (ref 4.6–6.5)

## 2016-11-15 MED ORDER — METFORMIN HCL 500 MG PO TABS: ORAL_TABLET | ORAL | 1 refills | Status: DC

## 2016-11-15 MED ORDER — HYDROCODONE-ACETAMINOPHEN 5-325 MG PO TABS: ORAL_TABLET | ORAL | 0 refills | Status: DC

## 2016-11-15 MED ORDER — CANAGLIFLOZIN 100 MG PO TABS: ORAL_TABLET | ORAL | 1 refills | Status: DC

## 2016-11-15 MED ORDER — GLIPIZIDE 10 MG PO TABS: ORAL_TABLET | ORAL | 1 refills | Status: DC

## 2016-11-15 MED ORDER — LISINOPRIL 5 MG PO TABS: 5.0000 mg | ORAL_TABLET | Freq: Every day | ORAL | 1 refills | Status: DC

## 2016-11-15 MED ORDER — TRAMADOL HCL 50 MG PO TABS: ORAL_TABLET | ORAL | 0 refills | Status: DC
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
## 2016-11-15 NOTE — Progress Notes (Signed)
Pre visit review using our clinic review tool, if applicable. No additional management support is needed unless otherwise documented below in the visit note. 

## 2016-11-18 ENCOUNTER — Other Ambulatory Visit: Payer: Self-pay | Admitting: Family Medicine

## 2016-11-18 MED ORDER — SAXAGLIPTIN HCL 5 MG PO TABS: 5.0000 mg | ORAL_TABLET | Freq: Every day | ORAL | 6 refills | Status: DC

## 2016-12-06 ENCOUNTER — Ambulatory Visit
Admission: RE | Admit: 2016-12-06 | Discharge: 2016-12-06 | Disposition: A | Discharge status: Home / Self Care | Payer: BLUE CROSS/BLUE SHIELD | Source: Ambulatory Visit | Attending: Family Medicine | Admitting: Family Medicine

## 2016-12-06 DIAGNOSIS — Z1239 Encounter for other screening for malignant neoplasm of breast: Secondary | ICD-10-CM

## 2016-12-10 ENCOUNTER — Other Ambulatory Visit: Payer: Self-pay | Admitting: Family Medicine

## 2016-12-25 ENCOUNTER — Encounter: Payer: Self-pay | Admitting: Family Medicine

## 2016-12-25 ENCOUNTER — Other Ambulatory Visit: Payer: Self-pay | Admitting: Family Medicine

## 2016-12-25 NOTE — Telephone Encounter (Signed)
[place].  RF request for tramadol LOV: 11/15/16 Next ov: 03/31/17 Last written: 11/15/16 #90 w/ 0RF  Please advise. Thanks.

## 2016-12-26 NOTE — Telephone Encounter (Signed)
Rx faxed

## 2017-03-06 ENCOUNTER — Other Ambulatory Visit: Payer: Self-pay | Admitting: Family Medicine

## 2017-03-06 MED ORDER — HYDROCODONE-ACETAMINOPHEN 5-325 MG PO TABS: ORAL_TABLET | ORAL | 0 refills | Status: DC

## 2017-03-06 NOTE — Telephone Encounter (Signed)
Patient requesting refill of HYDROcodone-acetaminophen (NORCO/VICODIN) 5-325 MG tablet. ° ° °

## 2017-03-06 NOTE — Telephone Encounter (Signed)
RF request for hydro/apap LOV: 11/15/16 Next ov: 03/31/17 Last written: 11/15/16 #90 w/ 0RF  Please advise. Thanks.

## 2017-03-07 ENCOUNTER — Other Ambulatory Visit: Payer: Self-pay | Admitting: Family Medicine

## 2017-03-07 NOTE — Telephone Encounter (Signed)
Rx put up front for p/u. Pt advised and voiced understanding.   

## 2017-03-07 NOTE — Telephone Encounter (Signed)
Rx faxed

## 2017-03-07 NOTE — Telephone Encounter (Signed)
[place].  RF request for tramadol LOV: 11/15/16 Next ov: 03/31/17 Last written: 12/25/16 #90 w/ 1RF  Please advise. Thanks.

## 2017-03-31 ENCOUNTER — Encounter: Payer: Self-pay | Admitting: Family Medicine

## 2017-03-31 ENCOUNTER — Ambulatory Visit: Payer: BLUE CROSS/BLUE SHIELD | Admitting: Family Medicine
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
## 2017-04-16 ENCOUNTER — Encounter: Payer: Self-pay | Admitting: *Deleted

## 2017-04-16 ENCOUNTER — Encounter: Payer: Self-pay | Admitting: Family Medicine

## 2017-04-16 ENCOUNTER — Ambulatory Visit (INDEPENDENT_AMBULATORY_CARE_PROVIDER_SITE_OTHER): Payer: Self-pay | Admitting: Family Medicine

## 2017-04-16 VITALS — BP 121/72 | HR 66 | Temp 97.6°F | Resp 16 | Ht 66.0 in | Wt 211.5 lb

## 2017-04-16 DIAGNOSIS — E119 Type 2 diabetes mellitus without complications: Secondary | ICD-10-CM

## 2017-04-16 DIAGNOSIS — I10 Essential (primary) hypertension: Secondary | ICD-10-CM

## 2017-04-16 DIAGNOSIS — R319 Hematuria, unspecified: Secondary | ICD-10-CM

## 2017-04-16 DIAGNOSIS — G894 Chronic pain syndrome: Secondary | ICD-10-CM

## 2017-04-16 DIAGNOSIS — R350 Frequency of micturition: Secondary | ICD-10-CM

## 2017-04-16 DIAGNOSIS — Z23 Encounter for immunization: Secondary | ICD-10-CM

## 2017-04-16 LAB — POC URINALSYSI DIPSTICK (AUTOMATED)
Bilirubin, UA: NEGATIVE
Glucose, UA: NEGATIVE
Ketones, UA: NEGATIVE
Leukocytes, UA: NEGATIVE
Nitrite, UA: NEGATIVE
Spec Grav, UA: >=1.03 — AB (ref 1.010–1.025)
Urobilinogen, UA: 0.2 E.U./dL
pH, UA: 5.5 (ref 5.0–8.0)

## 2017-04-16 LAB — POCT GLYCOSYLATED HEMOGLOBIN (HGB A1C): Hemoglobin A1C: 7.4

## 2017-04-16 MED ORDER — HYDROCODONE-ACETAMINOPHEN 5-325 MG PO TABS: ORAL_TABLET | ORAL | 0 refills | Status: DC

## 2017-04-16 MED ORDER — SULFAMETHOXAZOLE-TRIMETHOPRIM 800-160 MG PO TABS: 1.0000 | ORAL_TABLET | Freq: Two times a day (BID) | ORAL | 0 refills | Status: DC

## 2017-04-16 MED ORDER — TRAMADOL HCL 50 MG PO TABS: ORAL_TABLET | ORAL | 2 refills | Status: DC
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
## 2017-04-18 LAB — URINE CULTURE
MICRO NUMBER:: 80976466
SPECIMEN QUALITY:: ADEQUATE

## 2017-04-22 ENCOUNTER — Other Ambulatory Visit: Payer: Self-pay | Admitting: Family Medicine

## 2017-04-22 NOTE — Telephone Encounter (Signed)
Refills sent to [facility].

## 2017-05-20 ENCOUNTER — Telehealth: Payer: Self-pay | Admitting: Family Medicine

## 2017-05-20 NOTE — Telephone Encounter (Signed)
Please advise. Thanks.  

## 2017-05-20 NOTE — Telephone Encounter (Signed)
Patient cannot afford Invokana and onglyza. Patient currently does not have insurance. Is there a cheaper medication she can take?

## 2017-05-21 MED ORDER — PIOGLITAZONE HCL 15 MG PO TABS: ORAL_TABLET | ORAL | 1 refills | Status: DC

## 2017-05-21 NOTE — Telephone Encounter (Signed)
Continue glipizide and metformin at current dosing. Pls eRx pioglitazone 15 mg, 1 tab po qd.  Increase every 2 weeks by one tab per day until max of 3 tabs per day. #90, RF x 1.

## 2017-05-21 NOTE — Telephone Encounter (Signed)
Pt advised and voiced understanding.  Rx sent.  

## 2017-05-21 NOTE — Addendum Note (Signed)
Addended by: Onalee Hua on: 05/21/2017 10:20 AM   Modules accepted: Orders

## 2017-06-27 ENCOUNTER — Other Ambulatory Visit: Payer: Self-pay | Admitting: Family Medicine

## 2017-07-25 ENCOUNTER — Telehealth: Payer: Self-pay | Admitting: Family Medicine

## 2017-07-25 ENCOUNTER — Ambulatory Visit: Payer: Self-pay | Admitting: Family Medicine

## 2017-07-25 NOTE — Telephone Encounter (Signed)
Copied from [place] [id number]. Topic: Inquiry >> Jul 25, 2017  1:59 PM Claudia [name] wrote: Reason for CRM: Patient would like to know if Dr. Anitra Lauth could call her something in for gout. Patient had an appt today, but got to busy at work and had to cancel the appt. Patient is requesting a call back from Dr. Genelle Gather or his asst.

## 2017-07-25 NOTE — Telephone Encounter (Signed)
Spoke with patient, she was notified that she would need to be seen to get medication for her gout. Explained that she would have to be evaluated and then treated for her problems.  Patient acknowledged understanding and stated that she would call back Monday and schedule an appointment.

## 2017-07-25 NOTE — Telephone Encounter (Signed)
Agree/noted. 

## 2017-08-14 ENCOUNTER — Encounter: Payer: BLUE CROSS/BLUE SHIELD | Admitting: Family Medicine

## 2017-09-09 ENCOUNTER — Encounter: Payer: Self-pay | Admitting: Family Medicine

## 2017-09-09 ENCOUNTER — Ambulatory Visit: Payer: BLUE CROSS/BLUE SHIELD | Admitting: Family Medicine

## 2017-09-09 ENCOUNTER — Encounter: Payer: BLUE CROSS/BLUE SHIELD | Admitting: Family Medicine

## 2017-09-09 ENCOUNTER — Other Ambulatory Visit: Payer: Self-pay | Admitting: Family Medicine

## 2017-09-09 VITALS — BP 117/73 | HR 72 | Temp 97.9°F | Resp 16 | Ht 66.0 in | Wt 217.5 lb

## 2017-09-09 DIAGNOSIS — J0100 Acute maxillary sinusitis, unspecified: Secondary | ICD-10-CM

## 2017-09-09 MED ORDER — DOXYCYCLINE HYCLATE 100 MG PO CAPS: ORAL_CAPSULE | ORAL | 0 refills | Status: DC

## 2017-09-09 MED ORDER — HYDROCODONE-ACETAMINOPHEN 5-325 MG PO TABS: ORAL_TABLET | ORAL | 0 refills | Status: DC

## 2017-09-09 MED ORDER — TRAMADOL HCL 50 MG PO TABS: ORAL_TABLET | ORAL | 2 refills | Status: DC
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
## 2017-09-09 NOTE — Patient Instructions (Signed)
Get otc generic robitussin DM OR Mucinex DM and use as directed on the packaging for cough and congestion. Use otc generic saline nasal spray 2-3 times per day to irrigate/moisturize your nasal passages.   

## 2017-09-12 ENCOUNTER — Telehealth: Payer: Self-pay | Admitting: Family Medicine

## 2017-09-12 MED ORDER — BENZONATATE 200 MG PO CAPS: 200.0000 mg | ORAL_CAPSULE | Freq: Three times a day (TID) | ORAL | 0 refills | Status: DC | PRN

## 2017-09-12 NOTE — Telephone Encounter (Signed)
Pt advised and voiced understanding.   

## 2017-09-12 NOTE — Telephone Encounter (Signed)
Please advise. Thanks.  

## 2017-09-12 NOTE — Telephone Encounter (Signed)
I eRx'd tessalon perles. If pt requests any other type of prescription cough med, the answer is no.

## 2017-09-12 NOTE — Telephone Encounter (Signed)
Copied from [place] [id number]. Topic: Quick Communication - Rx Refill/Question >> Sep 12, 2017  1:28 PM Scherrie Gerlach wrote: Medication: cough syrup  Pt saw dr on 1/29 and cough is not better. Wants to know if the dr will call in a cough med. [place], [place] - [address] [phone] (Phone) [phone] (Fax)

## 2017-11-06 ENCOUNTER — Ambulatory Visit: Payer: BLUE CROSS/BLUE SHIELD | Admitting: Family Medicine

## 2017-11-06 ENCOUNTER — Telehealth: Payer: Self-pay | Admitting: *Deleted

## 2017-11-06 ENCOUNTER — Encounter: Payer: Self-pay | Admitting: Family Medicine

## 2017-11-06 ENCOUNTER — Encounter: Payer: Self-pay | Admitting: *Deleted

## 2017-11-06 VITALS — BP 124/70 | HR 67 | Temp 98.2°F | Resp 16 | Ht 66.0 in | Wt 210.2 lb

## 2017-11-06 DIAGNOSIS — M545 Low back pain, unspecified: Secondary | ICD-10-CM

## 2017-11-06 DIAGNOSIS — E119 Type 2 diabetes mellitus without complications: Secondary | ICD-10-CM

## 2017-11-06 DIAGNOSIS — G8929 Other chronic pain: Secondary | ICD-10-CM

## 2017-11-06 DIAGNOSIS — IMO0002 Reserved for concepts with insufficient information to code with codable children: Secondary | ICD-10-CM | POA: Insufficient documentation

## 2017-11-06 DIAGNOSIS — E1365 Other specified diabetes mellitus with hyperglycemia: Secondary | ICD-10-CM | POA: Insufficient documentation

## 2017-11-06 DIAGNOSIS — Z79899 Other long term (current) drug therapy: Secondary | ICD-10-CM

## 2017-11-06 DIAGNOSIS — I10 Essential (primary) hypertension: Secondary | ICD-10-CM | POA: Diagnosis not present

## 2017-11-06 DIAGNOSIS — M170 Bilateral primary osteoarthritis of knee: Secondary | ICD-10-CM | POA: Diagnosis not present

## 2017-11-06 DIAGNOSIS — G894 Chronic pain syndrome: Secondary | ICD-10-CM

## 2017-11-06 LAB — COMPREHENSIVE METABOLIC PANEL WITH GFR
ALT: 16 U/L (ref 0–35)
AST: 13 U/L (ref 0–37)
Albumin: 4 g/dL (ref 3.5–5.2)
Alkaline Phosphatase: 162 U/L — ABNORMAL HIGH (ref 39–117)
BUN: 10 mg/dL (ref 6–23)
CO2: 30 meq/L (ref 19–32)
Calcium: 9.5 mg/dL (ref 8.4–10.5)
Chloride: 96 meq/L (ref 96–112)
Creatinine, Ser: 0.64 mg/dL (ref 0.40–1.20)
GFR: 99.8 mL/min
Glucose, Bld: 357 mg/dL — ABNORMAL HIGH (ref 70–99)
Potassium: 4 meq/L (ref 3.5–5.1)
Sodium: 132 meq/L — ABNORMAL LOW (ref 135–145)
Total Bilirubin: 0.8 mg/dL (ref 0.2–1.2)
Total Protein: 7.2 g/dL (ref 6.0–8.3)

## 2017-11-06 LAB — LIPID PANEL
Cholesterol: 156 mg/dL (ref 0–200)
HDL: 62.6 mg/dL (ref >39.00)
LDL Cholesterol: 76 mg/dL (ref 0–99)
NonHDL: 93.84
Total CHOL/HDL Ratio: 2
Triglycerides: 89 mg/dL (ref 0.0–149.0)
VLDL: 17.8 mg/dL (ref 0.0–40.0)

## 2017-11-06 LAB — HEMOGLOBIN A1C: Hgb A1c MFr Bld: 10.1 % — ABNORMAL HIGH (ref 4.6–6.5)

## 2017-11-06 LAB — MICROALBUMIN / CREATININE URINE RATIO
Creatinine,U: 52.5 mg/dL
Microalb Creat Ratio: 3.6 mg/g (ref 0.0–30.0)
Microalb, Ur: 1.9 mg/dL (ref 0.0–1.9)

## 2017-11-06 MED ORDER — HYDROCODONE-ACETAMINOPHEN 5-325 MG PO TABS: ORAL_TABLET | ORAL | 0 refills | Status: DC

## 2017-11-06 MED ORDER — TRAMADOL HCL 50 MG PO TABS: ORAL_TABLET | ORAL | 2 refills | Status: DC

## 2017-11-06 MED ORDER — PIOGLITAZONE HCL 30 MG PO TABS: 30.0000 mg | ORAL_TABLET | Freq: Every day | ORAL | 3 refills | Status: DC

## 2017-11-06 MED ORDER — DICLOFENAC SODIUM 1 % TD GEL: TRANSDERMAL | 6 refills | Status: DC
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
## 2017-11-06 NOTE — Telephone Encounter (Signed)
PA sent via covermymed on 11/06/17   Key: [id number]   Medication: Tramadol HCI 50mg  tablets   Dx: G89.4 - Chronic Pain Syndrome   Per Dr. Anitra Lauth pt has tried and failed: N/A   Waiting for response.

## 2017-11-06 NOTE — Telephone Encounter (Signed)
PA sent via covermymed on 11/06/17   Key: [id number]   Medication: Diclofenac Sodium 1% Gel   Dx: M15.9 Osteoarthritis of multiple joints   Per Dr. Anitra Lauth pt has tried and failed oral NSAIDS. (GI upset)   Waiting for response.

## 2017-11-06 NOTE — Telephone Encounter (Signed)
PA approved 11/06/17   Ref#: N/A   Approved through 11/06/17 to 12/05/17.

## 2017-11-06 NOTE — Telephone Encounter (Signed)
PA approved on 11/06/17.   Ref#: N/A   Approved through 11/06/17 to 08/11/38.

## 2017-11-06 NOTE — Telephone Encounter (Signed)
PA sent via covermymed on 11/06/17.   Key: [id number]   Medication: Hydrocodone/APAP   Dx: G89.4 Chronic Pain Syndrome   Per Dr. Anitra Lauth pt has tried and failed: N/A   Waiting for response.

## 2017-11-07 ENCOUNTER — Other Ambulatory Visit: Payer: Self-pay | Admitting: Family Medicine

## 2017-11-07 MED ORDER — CANAGLIFLOZIN 100 MG PO TABS: 100.0000 mg | ORAL_TABLET | Freq: Every day | ORAL | 3 refills | Status: DC

## 2017-11-10 LAB — PAIN MGMT, PROFILE 8 W/CONF, U
6 Acetylmorphine: NEGATIVE ng/mL (ref <10)
Alcohol Metabolites: NEGATIVE ng/mL (ref <500)
Amphetamines: NEGATIVE ng/mL (ref <500)
Benzodiazepines: NEGATIVE ng/mL (ref <100)
Buprenorphine, Urine: NEGATIVE ng/mL (ref <5)
Cocaine Metabolite: NEGATIVE ng/mL (ref <150)
Codeine: NEGATIVE ng/mL (ref <50)
Creatinine: 56 mg/dL
Hydrocodone: 177 ng/mL — ABNORMAL HIGH (ref <50)
Hydromorphone: NEGATIVE ng/mL (ref <50)
MDMA: NEGATIVE ng/mL (ref <500)
Marijuana Metabolite: NEGATIVE ng/mL (ref <20)
Morphine: NEGATIVE ng/mL (ref <50)
Norhydrocodone: 269 ng/mL — ABNORMAL HIGH (ref <50)
Opiates: POSITIVE ng/mL — AB (ref <100)
Oxidant: NEGATIVE ug/mL (ref <200)
Oxycodone: NEGATIVE ng/mL (ref <100)
pH: 6.82 (ref 4.5–9.0)

## 2017-12-11 ENCOUNTER — Encounter: Payer: Self-pay | Admitting: Family Medicine

## 2017-12-11 ENCOUNTER — Other Ambulatory Visit: Payer: Self-pay | Admitting: Family Medicine

## 2017-12-11 ENCOUNTER — Ambulatory Visit: Payer: BLUE CROSS/BLUE SHIELD | Admitting: Family Medicine

## 2017-12-11 VITALS — BP 119/82 | HR 64 | Temp 97.5°F | Resp 16 | Ht 66.0 in | Wt 206.0 lb

## 2017-12-11 DIAGNOSIS — M25511 Pain in right shoulder: Secondary | ICD-10-CM

## 2017-12-11 DIAGNOSIS — M7581 Other shoulder lesions, right shoulder: Secondary | ICD-10-CM

## 2017-12-11 MED ORDER — METHYLPREDNISOLONE ACETATE 40 MG/ML IJ SUSP
40.0000 mg | Freq: Once | INTRAMUSCULAR | Status: AC
Start: 2017-12-11 — End: 2017-12-11
  Administered 2017-12-11: 40 mg via INTRAMUSCULAR

## 2017-12-11 NOTE — Addendum Note (Signed)
Addended by: Onalee Hua on: 12/11/2017 09:23 AM   Modules accepted: Orders
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
## 2017-12-11 NOTE — Patient Instructions (Signed)
Ice the R shoulder for 20-30 min today and also before bed. No more advil or voltaren to the shoulder for now. Do gentle ROM exercises with R shoulder starting tonight.

## 2018-01-01 ENCOUNTER — Other Ambulatory Visit: Payer: Self-pay | Admitting: Family Medicine

## 2018-01-03 ENCOUNTER — Other Ambulatory Visit: Payer: Self-pay | Admitting: Family Medicine

## 2018-01-06 NOTE — Telephone Encounter (Signed)
RF request for hydro/apap LOV: 12/11/17 Next ov: None Last written: 11/06/17 #90 w/ 0RF  Please advise. Thanks.

## 2018-01-06 NOTE — Telephone Encounter (Signed)
Her most recent rx says "fill on/after 01/04/18". She is not due for vicodin refill. Needs pain mgmt f/u visit the last week of June.-thx

## 2018-01-06 NOTE — Telephone Encounter (Signed)
SW pt, she stated that she did not ask the pharmacy to contact us for a refill for the hydro/apap. She stated that she should have Rx's on file at the pharmacy for her hydro/apap.   She stated that she requested Rx for diflucan. Looks like Rx was sent today.

## 2018-03-18 ENCOUNTER — Other Ambulatory Visit: Payer: Self-pay | Admitting: Family Medicine

## 2018-03-18 NOTE — Telephone Encounter (Signed)
Authorized 1 RF of this, but pls remind pt she is overdue for f/u of her chronic pain and her diabetes.-thx

## 2018-03-25 ENCOUNTER — Other Ambulatory Visit: Payer: Self-pay | Admitting: Family Medicine

## 2018-03-25 NOTE — Telephone Encounter (Signed)
Will rx #30 tabs of vicodin. Pt needs f/u q78mo on this medication. No FURTHER RF's of this med until she follows up in office.-thx

## 2018-03-25 NOTE — Telephone Encounter (Signed)
RF request for hydro/apap LOV: 12/11/17 Next ov: None Last written: 11/06/17 #90 w/ 0RF  Please advise. Thanks.

## 2018-03-26 NOTE — Telephone Encounter (Signed)
Pt advised and voiced understanding.    Apt made for 04/07/18 at 8:45am.

## 2018-04-07 ENCOUNTER — Encounter: Payer: Self-pay | Admitting: Family Medicine

## 2018-04-07 ENCOUNTER — Encounter: Payer: Self-pay | Admitting: *Deleted

## 2018-04-07 ENCOUNTER — Ambulatory Visit: Payer: BLUE CROSS/BLUE SHIELD | Admitting: Family Medicine

## 2018-04-07 VITALS — BP 113/70 | HR 62 | Temp 98.3°F | Resp 16 | Ht 66.0 in | Wt 211.4 lb

## 2018-04-07 DIAGNOSIS — G894 Chronic pain syndrome: Secondary | ICD-10-CM | POA: Diagnosis not present

## 2018-04-07 DIAGNOSIS — M545 Low back pain, unspecified: Secondary | ICD-10-CM

## 2018-04-07 DIAGNOSIS — E119 Type 2 diabetes mellitus without complications: Secondary | ICD-10-CM

## 2018-04-07 DIAGNOSIS — M170 Bilateral primary osteoarthritis of knee: Secondary | ICD-10-CM

## 2018-04-07 DIAGNOSIS — Z23 Encounter for immunization: Secondary | ICD-10-CM | POA: Diagnosis not present

## 2018-04-07 DIAGNOSIS — M5136 Other intervertebral disc degeneration, lumbar region: Secondary | ICD-10-CM

## 2018-04-07 DIAGNOSIS — G8929 Other chronic pain: Secondary | ICD-10-CM

## 2018-04-07 DIAGNOSIS — I10 Essential (primary) hypertension: Secondary | ICD-10-CM

## 2018-04-07 DIAGNOSIS — E7800 Pure hypercholesterolemia, unspecified: Secondary | ICD-10-CM

## 2018-04-07 DIAGNOSIS — E669 Obesity, unspecified: Secondary | ICD-10-CM

## 2018-04-07 LAB — HEMOGLOBIN A1C: Hgb A1c MFr Bld: 9.5 % — ABNORMAL HIGH (ref 4.6–6.5)

## 2018-04-07 LAB — COMPREHENSIVE METABOLIC PANEL
ALT: 15 U/L (ref 0–35)
AST: 12 U/L (ref 0–37)
Albumin: 4.4 g/dL (ref 3.5–5.2)
Alkaline Phosphatase: 175 U/L — ABNORMAL HIGH (ref 39–117)
BUN: 10 mg/dL (ref 6–23)
CO2: 30 mEq/L (ref 19–32)
Calcium: 9.9 mg/dL (ref 8.4–10.5)
Chloride: 99 mEq/L (ref 96–112)
Creatinine, Ser: 0.75 mg/dL (ref 0.40–1.20)
GFR: 83 mL/min (ref >60.00)
Glucose, Bld: 268 mg/dL — ABNORMAL HIGH (ref 70–99)
Potassium: 4 mEq/L (ref 3.5–5.1)
Sodium: 135 mEq/L (ref 135–145)
Total Bilirubin: 0.9 mg/dL (ref 0.2–1.2)
Total Protein: 7.5 g/dL (ref 6.0–8.3)

## 2018-04-07 MED ORDER — TRAMADOL HCL 50 MG PO TABS: ORAL_TABLET | ORAL | 2 refills | Status: DC

## 2018-04-07 MED ORDER — HYDROCODONE-ACETAMINOPHEN 5-325 MG PO TABS: ORAL_TABLET | ORAL | 0 refills | Status: DC
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
## 2018-04-14 ENCOUNTER — Other Ambulatory Visit: Payer: Self-pay

## 2018-04-14 MED ORDER — CANAGLIFLOZIN 100 MG PO TABS: 100.0000 mg | ORAL_TABLET | Freq: Every day | ORAL | 3 refills | Status: DC

## 2018-06-08 ENCOUNTER — Other Ambulatory Visit: Payer: Self-pay | Admitting: Family Medicine

## 2018-06-08 DIAGNOSIS — Z1231 Encounter for screening mammogram for malignant neoplasm of breast: Secondary | ICD-10-CM

## 2018-06-18 ENCOUNTER — Other Ambulatory Visit: Payer: Self-pay | Admitting: *Deleted

## 2018-06-18 NOTE — Telephone Encounter (Signed)
Received fax from pts pharmacy stating the would like New Rx.   Please advise. Thanks.

## 2018-06-19 MED ORDER — FLUCONAZOLE 150 MG PO TABS: ORAL_TABLET | ORAL | 1 refills | Status: DC

## 2018-07-03 ENCOUNTER — Ambulatory Visit: Payer: BLUE CROSS/BLUE SHIELD | Admitting: Family Medicine

## 2018-07-03 ENCOUNTER — Encounter: Payer: Self-pay | Admitting: Family Medicine

## 2018-07-03 VITALS — BP 131/77 | HR 68 | Temp 97.4°F | Resp 18 | Ht 66.0 in | Wt 223.0 lb

## 2018-07-03 DIAGNOSIS — J0100 Acute maxillary sinusitis, unspecified: Secondary | ICD-10-CM | POA: Diagnosis not present

## 2018-07-03 MED ORDER — DOXYCYCLINE HYCLATE 100 MG PO CAPS: ORAL_CAPSULE | ORAL | 0 refills | Status: DC

## 2018-07-03 MED ORDER — BENZONATATE 200 MG PO CAPS: 200.0000 mg | ORAL_CAPSULE | Freq: Three times a day (TID) | ORAL | 0 refills | Status: DC | PRN
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
## 2018-07-03 NOTE — Patient Instructions (Addendum)
Use otc generic saline nasal spray 2-3 times per day to irrigate/moisturize your nasal passages.

## 2018-07-06 ENCOUNTER — Encounter: Payer: Self-pay | Admitting: Family Medicine

## 2018-07-06 ENCOUNTER — Ambulatory Visit: Payer: BLUE CROSS/BLUE SHIELD | Admitting: Family Medicine

## 2018-07-06 VITALS — BP 133/69 | HR 60 | Temp 97.8°F | Resp 16 | Ht 66.0 in | Wt 219.2 lb

## 2018-07-06 DIAGNOSIS — E119 Type 2 diabetes mellitus without complications: Secondary | ICD-10-CM

## 2018-07-06 DIAGNOSIS — M170 Bilateral primary osteoarthritis of knee: Secondary | ICD-10-CM

## 2018-07-06 DIAGNOSIS — I10 Essential (primary) hypertension: Secondary | ICD-10-CM

## 2018-07-06 DIAGNOSIS — E7800 Pure hypercholesterolemia, unspecified: Secondary | ICD-10-CM

## 2018-07-06 DIAGNOSIS — M5136 Other intervertebral disc degeneration, lumbar region: Secondary | ICD-10-CM

## 2018-07-06 DIAGNOSIS — G894 Chronic pain syndrome: Secondary | ICD-10-CM | POA: Diagnosis not present

## 2018-07-06 LAB — BASIC METABOLIC PANEL
BUN: 11 mg/dL (ref 6–23)
CO2: 27 mEq/L (ref 19–32)
Calcium: 9.6 mg/dL (ref 8.4–10.5)
Chloride: 104 mEq/L (ref 96–112)
Creatinine, Ser: 0.71 mg/dL (ref 0.40–1.20)
GFR: 88.35 mL/min (ref >60.00)
Glucose, Bld: 149 mg/dL — ABNORMAL HIGH (ref 70–99)
Potassium: 3.8 mEq/L (ref 3.5–5.1)
Sodium: 140 mEq/L (ref 135–145)

## 2018-07-06 LAB — HEMOGLOBIN A1C: Hgb A1c MFr Bld: 7.5 % — ABNORMAL HIGH (ref 4.6–6.5)

## 2018-07-06 MED ORDER — CANAGLIFLOZIN 100 MG PO TABS: 100.0000 mg | ORAL_TABLET | Freq: Every day | ORAL | 1 refills | Status: DC

## 2018-07-06 MED ORDER — FLUCONAZOLE 150 MG PO TABS: ORAL_TABLET | ORAL | 1 refills | Status: DC

## 2018-07-06 MED ORDER — TRAMADOL HCL 50 MG PO TABS: ORAL_TABLET | ORAL | 2 refills | Status: DC

## 2018-07-06 MED ORDER — PIOGLITAZONE HCL 30 MG PO TABS: 30.0000 mg | ORAL_TABLET | Freq: Every day | ORAL | 1 refills | Status: DC

## 2018-07-06 MED ORDER — LISINOPRIL 5 MG PO TABS: 5.0000 mg | ORAL_TABLET | Freq: Every day | ORAL | 1 refills | Status: DC

## 2018-07-06 MED ORDER — GLIPIZIDE 10 MG PO TABS: ORAL_TABLET | ORAL | 1 refills | Status: DC

## 2018-07-06 MED ORDER — HYDROCODONE-ACETAMINOPHEN 5-325 MG PO TABS: ORAL_TABLET | ORAL | 0 refills | Status: DC
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
## 2018-07-07 ENCOUNTER — Encounter: Payer: Self-pay | Admitting: *Deleted

## 2018-07-07 ENCOUNTER — Other Ambulatory Visit: Payer: Self-pay | Admitting: Family Medicine

## 2018-07-21 ENCOUNTER — Ambulatory Visit: Payer: BLUE CROSS/BLUE SHIELD

## 2018-08-17 ENCOUNTER — Ambulatory Visit (INDEPENDENT_AMBULATORY_CARE_PROVIDER_SITE_OTHER): Payer: Self-pay | Admitting: Family Medicine

## 2018-08-17 ENCOUNTER — Encounter: Payer: Self-pay | Admitting: Family Medicine

## 2018-08-17 VITALS — BP 112/71 | HR 76 | Temp 98.0°F | Resp 16 | Ht 66.0 in | Wt 227.5 lb

## 2018-08-17 DIAGNOSIS — J069 Acute upper respiratory infection, unspecified: Secondary | ICD-10-CM

## 2018-08-17 DIAGNOSIS — B9789 Other viral agents as the cause of diseases classified elsewhere: Secondary | ICD-10-CM

## 2018-08-17 DIAGNOSIS — J040 Acute laryngitis: Secondary | ICD-10-CM

## 2018-08-17 MED ORDER — HYDROCODONE-HOMATROPINE 5-1.5 MG/5ML PO SYRP: ORAL_SOLUTION | ORAL | 0 refills | Status: DC
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
## 2018-08-17 NOTE — Patient Instructions (Signed)
Get otc generic robitussin DM OR Mucinex DM and use as directed on the packaging for cough and congestion. Use otc generic saline nasal spray 2-3 times per day to irrigate/moisturize your nasal passages.   

## 2018-08-18 ENCOUNTER — Telehealth: Payer: Self-pay | Admitting: *Deleted

## 2018-08-18 ENCOUNTER — Encounter: Payer: Self-pay | Admitting: *Deleted

## 2018-08-18 NOTE — Telephone Encounter (Signed)
Approved for Invokana (Canagliflozin Tablet 100 MG), quantity 30 tablets per 30 days.   Pt advised and voiced understanding.

## 2018-08-18 NOTE — Telephone Encounter (Signed)
PA sent via covermymed on 08/18/18   Key: [id number]   Medication: invokana 100mg  tab - 1 tab daily   Dx: DM w/out comp - E11.9   Per Dr. Anitra Lauth pt has tried and failed: metformin - GI upset   Waiting for response.

## 2018-08-20 ENCOUNTER — Other Ambulatory Visit: Payer: Self-pay

## 2018-08-20 ENCOUNTER — Telehealth: Payer: Self-pay | Admitting: *Deleted

## 2018-08-20 DIAGNOSIS — B9789 Other viral agents as the cause of diseases classified elsewhere: Principal | ICD-10-CM

## 2018-08-20 DIAGNOSIS — J069 Acute upper respiratory infection, unspecified: Secondary | ICD-10-CM

## 2018-08-20 MED ORDER — DOXYCYCLINE HYCLATE 100 MG PO TABS: 100.0000 mg | ORAL_TABLET | Freq: Two times a day (BID) | ORAL | 0 refills | Status: DC

## 2018-08-20 NOTE — Telephone Encounter (Signed)
Pt notified that abx has been called in

## 2018-08-20 NOTE — Telephone Encounter (Signed)
Copied from [facility] [place] [id number]. Topic: Quick Communication - Rx Refill/Question >> Aug 20, 2018  8:39 AM Scherrie Gerlach wrote: Medication:  an abx/  Pt states she saw Dr Anitra Lauth Monday 1/06 and still is not better.  Pt can barely speak and coughing up green mucus. Head is full of congestion. Pt states she really needs a abx at this time, because she has had to stay out of work all week.  [id number] - [place], [place] - [address] [phone] (Phone) [phone] (Fax)

## 2018-08-20 NOTE — Telephone Encounter (Signed)
Antibiotic eRx'd

## 2018-08-20 NOTE — Telephone Encounter (Signed)
Please advise. Thanks.  

## 2018-08-31 ENCOUNTER — Ambulatory Visit: Payer: BLUE CROSS/BLUE SHIELD

## 2018-09-15 ENCOUNTER — Telehealth: Payer: Self-pay | Admitting: *Deleted

## 2018-09-15 NOTE — Telephone Encounter (Signed)
PA sent via covermymed on 09/15/18.   Key: [id number]   Medication: Tramadol 50mg  tab   Dx: CPS - G89.4   Per Dr. Anitra Lauth pt has tried and failed: None   Waiting for response.

## 2018-09-16 NOTE — Telephone Encounter (Signed)
Approved for Tramadol HCl Tablet 50 MG, quantity 90 tablets per 22 days.

## 2018-09-17 ENCOUNTER — Encounter: Payer: Self-pay | Admitting: Family Medicine

## 2018-09-17 ENCOUNTER — Ambulatory Visit (INDEPENDENT_AMBULATORY_CARE_PROVIDER_SITE_OTHER): Payer: PRIVATE HEALTH INSURANCE | Admitting: Family Medicine

## 2018-09-17 VITALS — BP 127/78 | HR 63 | Temp 97.7°F | Resp 16 | Ht 66.0 in | Wt 222.0 lb

## 2018-09-17 DIAGNOSIS — G8929 Other chronic pain: Secondary | ICD-10-CM

## 2018-09-17 DIAGNOSIS — M170 Bilateral primary osteoarthritis of knee: Secondary | ICD-10-CM

## 2018-09-17 DIAGNOSIS — G894 Chronic pain syndrome: Secondary | ICD-10-CM

## 2018-09-17 DIAGNOSIS — M545 Low back pain, unspecified: Secondary | ICD-10-CM

## 2018-09-17 DIAGNOSIS — I10 Essential (primary) hypertension: Secondary | ICD-10-CM

## 2018-09-17 DIAGNOSIS — E119 Type 2 diabetes mellitus without complications: Secondary | ICD-10-CM

## 2018-09-17 DIAGNOSIS — M5136 Other intervertebral disc degeneration, lumbar region: Secondary | ICD-10-CM

## 2018-09-17 MED ORDER — DICLOFENAC SODIUM 1 % TD GEL: TRANSDERMAL | 6 refills | Status: DC

## 2018-09-17 MED ORDER — FLUCONAZOLE 150 MG PO TABS: ORAL_TABLET | ORAL | 1 refills | Status: DC

## 2018-09-17 MED ORDER — TRAMADOL HCL 50 MG PO TABS: ORAL_TABLET | ORAL | 2 refills | Status: DC

## 2018-09-17 MED ORDER — HYDROCODONE-ACETAMINOPHEN 5-325 MG PO TABS: ORAL_TABLET | ORAL | 0 refills | Status: DC
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
## 2018-10-01 ENCOUNTER — Ambulatory Visit
Admission: RE | Admit: 2018-10-01 | Discharge: 2018-10-01 | Disposition: A | Discharge status: Home / Self Care | Payer: PRIVATE HEALTH INSURANCE | Source: Ambulatory Visit | Attending: Family Medicine | Admitting: Family Medicine

## 2018-10-01 DIAGNOSIS — Z1231 Encounter for screening mammogram for malignant neoplasm of breast: Secondary | ICD-10-CM

## 2018-11-06 ENCOUNTER — Other Ambulatory Visit: Payer: Self-pay | Admitting: Family Medicine

## 2018-11-17 ENCOUNTER — Telehealth: Payer: Self-pay | Admitting: Family Medicine

## 2018-11-17 NOTE — Telephone Encounter (Signed)
FYI:   SW pt and pharmacy was able to give her a discount card. She can get 30 tabs of the Invokana for a cheaper price instead of doing an injection.

## 2018-11-17 NOTE — Telephone Encounter (Signed)
Please advise if there is an alternative due to expense

## 2018-11-17 NOTE — Telephone Encounter (Signed)
Victoza injection once daily OR a long-acting insulin injection once daily. No matter which one of these she chooses, she'll need to call her insurer and ask if victoza is preferred and ask what long-acting (once daily) insulin is preferred on her plan.

## 2018-11-17 NOTE — Telephone Encounter (Signed)
Patient says she went to the pharmacy to get her invokana and it was $700.  Please contact patient.

## 2018-11-18 NOTE — Telephone Encounter (Signed)
Noted  

## 2018-12-17 ENCOUNTER — Ambulatory Visit (INDEPENDENT_AMBULATORY_CARE_PROVIDER_SITE_OTHER): Payer: PRIVATE HEALTH INSURANCE | Admitting: Family Medicine

## 2018-12-17 ENCOUNTER — Encounter: Payer: Self-pay | Admitting: Family Medicine

## 2018-12-17 ENCOUNTER — Other Ambulatory Visit: Payer: Self-pay

## 2018-12-17 ENCOUNTER — Ambulatory Visit: Payer: PRIVATE HEALTH INSURANCE | Admitting: Family Medicine

## 2018-12-17 VITALS — BP 119/78

## 2018-12-17 DIAGNOSIS — I10 Essential (primary) hypertension: Secondary | ICD-10-CM | POA: Diagnosis not present

## 2018-12-17 DIAGNOSIS — J301 Allergic rhinitis due to pollen: Secondary | ICD-10-CM

## 2018-12-17 DIAGNOSIS — M170 Bilateral primary osteoarthritis of knee: Secondary | ICD-10-CM

## 2018-12-17 DIAGNOSIS — G894 Chronic pain syndrome: Secondary | ICD-10-CM | POA: Diagnosis not present

## 2018-12-17 DIAGNOSIS — M545 Low back pain, unspecified: Secondary | ICD-10-CM

## 2018-12-17 DIAGNOSIS — E119 Type 2 diabetes mellitus without complications: Secondary | ICD-10-CM

## 2018-12-17 DIAGNOSIS — G8929 Other chronic pain: Secondary | ICD-10-CM

## 2018-12-17 MED ORDER — FLUTICASONE PROPIONATE 50 MCG/ACT NA SUSP: 2.0000 | Freq: Every day | NASAL | 6 refills | Status: DC

## 2018-12-17 MED ORDER — HYDROCODONE-ACETAMINOPHEN 5-325 MG PO TABS: ORAL_TABLET | ORAL | 0 refills | Status: DC
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
## 2018-12-18 ENCOUNTER — Telehealth: Payer: Self-pay | Admitting: Family Medicine

## 2018-12-18 NOTE — Telephone Encounter (Signed)
SW pt and advised of PCP recommendations. She started to blow out green snot yesterday. Denies fever, having sinus pressure under and above eyes.  Would like to know if you have any suggestions. Please advise, thanks.

## 2018-12-18 NOTE — Telephone Encounter (Signed)
Copied from [place] [id number]. Topic: Quick Communication - Rx Refill/Question >> Dec 18, 2018  9:17 AM Burchel, Abbi R wrote: Medication: abx  Pt is concerned she may have/be developing a sinus infection since last OV  Please advise  Preferred Pharmacy: [id number] - [place], [place] - [address]  [phone] (Phone) [phone] (Fax)

## 2018-12-18 NOTE — Telephone Encounter (Signed)
Mychart message sent asking symptoms and advised suggestions PCP made at yesterday's OV.

## 2018-12-18 NOTE — Telephone Encounter (Signed)
Reassure patient this nothing new is recommended at this time; still expect her upper respiratory symptoms to resolve gradually over the next 7-10 days.-thx

## 2018-12-21 NOTE — Telephone Encounter (Signed)
MyChart message sent to patient.

## 2019-01-11 ENCOUNTER — Encounter: Payer: Self-pay | Admitting: Family Medicine

## 2019-01-11 ENCOUNTER — Other Ambulatory Visit: Payer: PRIVATE HEALTH INSURANCE

## 2019-01-11 NOTE — Telephone Encounter (Signed)
I don't prescribe any weight loss medication. I refer my [facility] to [facility]) if they want to try this.  Let me know-thx

## 2019-01-12 ENCOUNTER — Other Ambulatory Visit: Payer: Self-pay

## 2019-01-12 ENCOUNTER — Ambulatory Visit (INDEPENDENT_AMBULATORY_CARE_PROVIDER_SITE_OTHER): Payer: PRIVATE HEALTH INSURANCE | Admitting: Family Medicine

## 2019-01-12 DIAGNOSIS — E119 Type 2 diabetes mellitus without complications: Secondary | ICD-10-CM

## 2019-01-12 DIAGNOSIS — I10 Essential (primary) hypertension: Secondary | ICD-10-CM | POA: Diagnosis not present

## 2019-01-12 LAB — COMPREHENSIVE METABOLIC PANEL
ALT: 12 U/L (ref 0–35)
AST: 12 U/L (ref 0–37)
Albumin: 3.8 g/dL (ref 3.5–5.2)
Alkaline Phosphatase: 122 U/L — ABNORMAL HIGH (ref 39–117)
BUN: 16 mg/dL (ref 6–23)
CO2: 28 mEq/L (ref 19–32)
Calcium: 9 mg/dL (ref 8.4–10.5)
Chloride: 103 mEq/L (ref 96–112)
Creatinine, Ser: 0.75 mg/dL (ref 0.40–1.20)
GFR: 77.9 mL/min (ref >60.00)
Glucose, Bld: 140 mg/dL — ABNORMAL HIGH (ref 70–99)
Potassium: 4 mEq/L (ref 3.5–5.1)
Sodium: 136 mEq/L (ref 135–145)
Total Bilirubin: 0.6 mg/dL (ref 0.2–1.2)
Total Protein: 6.7 g/dL (ref 6.0–8.3)

## 2019-01-12 LAB — LIPID PANEL
Cholesterol: 184 mg/dL (ref 0–200)
HDL: 54.9 mg/dL (ref >39.00)
LDL Cholesterol: 106 mg/dL — ABNORMAL HIGH (ref 0–99)
NonHDL: 129.44
Total CHOL/HDL Ratio: 3
Triglycerides: 116 mg/dL (ref 0.0–149.0)
VLDL: 23.2 mg/dL (ref 0.0–40.0)

## 2019-01-12 LAB — MICROALBUMIN / CREATININE URINE RATIO
Creatinine,U: 61.3 mg/dL
Microalb Creat Ratio: 1.1 mg/g (ref 0.0–30.0)
Microalb, Ur: <0.7 mg/dL (ref 0.0–1.9)

## 2019-01-12 LAB — HEMOGLOBIN A1C: Hgb A1c MFr Bld: 7.9 % — ABNORMAL HIGH (ref 4.6–6.5)

## 2019-01-14 ENCOUNTER — Other Ambulatory Visit: Payer: Self-pay

## 2019-01-14 MED ORDER — CANAGLIFLOZIN 300 MG PO TABS: 300.0000 mg | ORAL_TABLET | Freq: Every day | ORAL | 3 refills | Status: DC

## 2019-01-18 ENCOUNTER — Ambulatory Visit: Payer: Self-pay

## 2019-01-18 NOTE — Telephone Encounter (Signed)
Pt called stating that her daughter is an nurse with GI symptoms and fever. Her doctor is sending her for COVID-19 testing. Pt states that she has been with her daughter over the past several days and when her daughter got sick she took her son in so her daughter would not infect him.  She has no symptoms and her daughter has not been tested positive but she is wondering if she should be tested.  She is diabetic. There is free testing offered tomorrow in her county. Home care advice read to patient. Patient verbalized understanding. Note will be routed to office for evaluation and advice tomorrow. Patient is aware.  Reason for Disposition . [1] No COVID-19 EXPOSURE BUT [2] living with someone who was exposed and who has no symptoms of COVID-19  Answer Assessment - Initial Assessment Questions 1. CLOSE CONTACT: "Who is the person with the confirmed or suspected COVID-19 infection that you were exposed to?"     Suspected health care worker 2. PLACE of CONTACT: "Where were you when you were exposed to COVID-19?" (e.g., home, school, medical waiting room; which city?)    home 3. TYPE of CONTACT: "How much contact was there?" (e.g., sitting next to, live in same house, work in same office, same building)     Keep suspected patients son 4. DURATION of CONTACT: "How long were you in contact with the COVID-19 patient?" (e.g., a few seconds, passed by person, a few minutes, live with the patient)     In and out 5. DATE of CONTACT: "When did you have contact with a COVID-19 patient?" (e.g., how many days ago)    Friday 6. TRAVEL: "Have you traveled out of the country recently?" If so, "When and where?"     * Also ask about out-of-state travel, since the CDC has identified some high-risk cities for community spread in the [place].     * Note: Travel becomes less relevant if there is widespread community transmission where the patient lives.    No 7. COMMUNITY SPREAD: "Are there lots of cases of COVID-19  (community spread) where you live?" (See public health department website, if unsure)      [place] 8. SYMPTOMS: "Do you have any symptoms?" (e.g., fever, cough, breathing difficulty)     None 9. PREGNANCY OR POSTPARTUM: "Is there any chance you are pregnant?" "When was your last menstrual period?" "Did you deliver in the last 2 weeks?"     No 10. HIGH RISK: "Do you have any heart or lung problems? Do you have a weak immune system?" (e.g., CHF, COPD, asthma, HIV positive, chemotherapy, renal failure, diabetes mellitus, sickle cell anemia)      diabetic  Protocols used: CORONAVIRUS (COVID-19) EXPOSURE-A-AH

## 2019-01-19 NOTE — Telephone Encounter (Signed)
SW patient regarding COVID concerns. Patient plans to go to free community testing clinic today, will keep [place] posted on result.

## 2019-02-11 ENCOUNTER — Other Ambulatory Visit: Payer: Self-pay | Admitting: Family Medicine

## 2019-02-18 ENCOUNTER — Encounter: Payer: Self-pay | Admitting: Family Medicine

## 2019-02-18 LAB — HM DIABETES EYE EXAM

## 2019-03-25 ENCOUNTER — Ambulatory Visit: Payer: PRIVATE HEALTH INSURANCE | Admitting: Family Medicine

## 2019-03-31 ENCOUNTER — Ambulatory Visit (INDEPENDENT_AMBULATORY_CARE_PROVIDER_SITE_OTHER): Payer: PRIVATE HEALTH INSURANCE | Admitting: Family Medicine

## 2019-03-31 ENCOUNTER — Encounter: Payer: Self-pay | Admitting: Family Medicine

## 2019-03-31 ENCOUNTER — Other Ambulatory Visit: Payer: Self-pay

## 2019-03-31 VITALS — BP 118/78 | Temp 98.4°F | Wt 228.0 lb

## 2019-03-31 DIAGNOSIS — M545 Low back pain, unspecified: Secondary | ICD-10-CM

## 2019-03-31 DIAGNOSIS — M5136 Other intervertebral disc degeneration, lumbar region: Secondary | ICD-10-CM

## 2019-03-31 DIAGNOSIS — I10 Essential (primary) hypertension: Secondary | ICD-10-CM

## 2019-03-31 DIAGNOSIS — E119 Type 2 diabetes mellitus without complications: Secondary | ICD-10-CM

## 2019-03-31 DIAGNOSIS — G8929 Other chronic pain: Secondary | ICD-10-CM

## 2019-03-31 DIAGNOSIS — M170 Bilateral primary osteoarthritis of knee: Secondary | ICD-10-CM

## 2019-03-31 DIAGNOSIS — G894 Chronic pain syndrome: Secondary | ICD-10-CM | POA: Diagnosis not present
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
## 2019-04-09 ENCOUNTER — Telehealth: Payer: Self-pay | Admitting: Family Medicine

## 2019-04-09 MED ORDER — TRAMADOL HCL 50 MG PO TABS: ORAL_TABLET | ORAL | 2 refills | Status: DC

## 2019-04-09 MED ORDER — HYDROCODONE-ACETAMINOPHEN 5-325 MG PO TABS: ORAL_TABLET | ORAL | 0 refills | Status: DC

## 2019-04-09 NOTE — Telephone Encounter (Signed)
Tramadol and hydrocodone eRx'd.

## 2019-04-09 NOTE — Telephone Encounter (Signed)
Patient called stating her insurance is not paying for these meds. She would like to know if PCP can make this med a priority.   Patient call back [phone]

## 2019-04-09 NOTE — Telephone Encounter (Signed)
Refill request  HYDROcodone-acetaminophen (NORCO/VICODIN) 5-325 MG tablet [id number]   raMADol (ULTRAM) 50 MG tablet [id number]   [id number] - [place], [place]      Thank you

## 2019-04-12 NOTE — Telephone Encounter (Signed)
Contacted patient's pharmacy.  Apparently, they do not have current insurance card on file.  Contacted patient to advise and she said they should because she did pick up the tramadol yesterday but she will go by the pharmacy today and take them her insurance card.     I advised her to CB if she needed anything done by [place]. She voiced understanding and appreciation.

## 2019-04-13 DIAGNOSIS — W19XXXA Unspecified fall, initial encounter: Secondary | ICD-10-CM

## 2019-04-13 DIAGNOSIS — Z8781 Personal history of (healed) traumatic fracture: Secondary | ICD-10-CM

## 2019-04-13 HISTORY — DX: Unspecified fall, initial encounter: W19.XXXA

## 2019-04-13 HISTORY — DX: Personal history of (healed) traumatic fracture: Z87.81

## 2019-04-28 ENCOUNTER — Ambulatory Visit: Payer: PRIVATE HEALTH INSURANCE

## 2019-04-28 ENCOUNTER — Encounter: Payer: Self-pay | Admitting: Family Medicine

## 2019-04-28 ENCOUNTER — Other Ambulatory Visit: Payer: Self-pay

## 2019-04-28 ENCOUNTER — Ambulatory Visit (INDEPENDENT_AMBULATORY_CARE_PROVIDER_SITE_OTHER): Payer: PRIVATE HEALTH INSURANCE | Admitting: Family Medicine

## 2019-04-28 ENCOUNTER — Other Ambulatory Visit: Payer: Self-pay | Admitting: Family Medicine

## 2019-04-28 ENCOUNTER — Telehealth: Payer: Self-pay | Admitting: Family Medicine

## 2019-04-28 DIAGNOSIS — Z23 Encounter for immunization: Secondary | ICD-10-CM

## 2019-04-28 MED ORDER — FLUCONAZOLE 150 MG PO TABS: 150.0000 mg | ORAL_TABLET | Freq: Every day | ORAL | 1 refills | Status: DC

## 2019-04-28 MED ORDER — DICLOFENAC SODIUM 1 % TD GEL: TRANSDERMAL | 6 refills | Status: DC

## 2019-04-28 NOTE — Telephone Encounter (Signed)
Patient has a flu shot scheduled for this afternoon at 2:30. Patient would like to know if she is due for her pneumonia and shingles vaccine. Can she combine any of these if she is due?

## 2019-04-28 NOTE — Telephone Encounter (Signed)
Will notify patient at her nurse appt.

## 2019-04-28 NOTE — Telephone Encounter (Signed)
Pt had pneumovax 23 06/2013, no record of shingles. Is patient okay to get this today or wait?   Please advise, thanks.

## 2019-04-28 NOTE — Telephone Encounter (Signed)
Pneumonia vaccine not needed at this time. Shingrix is fine to give today.-thx

## 2019-04-28 NOTE — Telephone Encounter (Signed)
During patient nurse visit, she stated that she needs her diflucan RF'd and her voltaren gel.     I can't see where you filled this diflucan in the past please advise RF's.  Thanks!

## 2019-05-03 ENCOUNTER — Inpatient Hospital Stay (HOSPITAL_COMMUNITY)
Admission: EM | Admit: 2019-05-03 | Discharge: 2019-05-07 | DRG: 470 | Disposition: A | Discharge status: Rehabilitation Facility | Payer: No Typology Code available for payment source | Attending: Internal Medicine | Admitting: Internal Medicine

## 2019-05-03 ENCOUNTER — Encounter (HOSPITAL_COMMUNITY): Payer: Self-pay | Admitting: Emergency Medicine

## 2019-05-03 ENCOUNTER — Emergency Department (HOSPITAL_COMMUNITY): Payer: No Typology Code available for payment source

## 2019-05-03 ENCOUNTER — Other Ambulatory Visit: Payer: Self-pay

## 2019-05-03 DIAGNOSIS — Z471 Aftercare following joint replacement surgery: Secondary | ICD-10-CM | POA: Diagnosis not present

## 2019-05-03 DIAGNOSIS — K5900 Constipation, unspecified: Secondary | ICD-10-CM | POA: Diagnosis present

## 2019-05-03 DIAGNOSIS — M19079 Primary osteoarthritis, unspecified ankle and foot: Secondary | ICD-10-CM | POA: Diagnosis present

## 2019-05-03 DIAGNOSIS — Z809 Family history of malignant neoplasm, unspecified: Secondary | ICD-10-CM

## 2019-05-03 DIAGNOSIS — Z90710 Acquired absence of both cervix and uterus: Secondary | ICD-10-CM

## 2019-05-03 DIAGNOSIS — Y990 Civilian activity done for income or pay: Secondary | ICD-10-CM | POA: Diagnosis not present

## 2019-05-03 DIAGNOSIS — S72001A Fracture of unspecified part of neck of right femur, initial encounter for closed fracture: Secondary | ICD-10-CM

## 2019-05-03 DIAGNOSIS — S52181D Other fracture of upper end of right radius, subsequent encounter for closed fracture with routine healing: Secondary | ICD-10-CM | POA: Diagnosis not present

## 2019-05-03 DIAGNOSIS — Z9049 Acquired absence of other specified parts of digestive tract: Secondary | ICD-10-CM | POA: Diagnosis not present

## 2019-05-03 DIAGNOSIS — Z6836 Body mass index (BMI) 36.0-36.9, adult: Secondary | ICD-10-CM | POA: Diagnosis not present

## 2019-05-03 DIAGNOSIS — S72011A Unspecified intracapsular fracture of right femur, initial encounter for closed fracture: Secondary | ICD-10-CM | POA: Diagnosis not present

## 2019-05-03 DIAGNOSIS — E785 Hyperlipidemia, unspecified: Secondary | ICD-10-CM | POA: Diagnosis present

## 2019-05-03 DIAGNOSIS — Z79899 Other long term (current) drug therapy: Secondary | ICD-10-CM

## 2019-05-03 DIAGNOSIS — G894 Chronic pain syndrome: Secondary | ICD-10-CM | POA: Diagnosis present

## 2019-05-03 DIAGNOSIS — IMO0002 Reserved for concepts with insufficient information to code with codable children: Secondary | ICD-10-CM

## 2019-05-03 DIAGNOSIS — S52101S Unspecified fracture of upper end of right radius, sequela: Secondary | ICD-10-CM | POA: Diagnosis not present

## 2019-05-03 DIAGNOSIS — Z96652 Presence of left artificial knee joint: Secondary | ICD-10-CM | POA: Diagnosis present

## 2019-05-03 DIAGNOSIS — Z7984 Long term (current) use of oral hypoglycemic drugs: Secondary | ICD-10-CM

## 2019-05-03 DIAGNOSIS — E1165 Type 2 diabetes mellitus with hyperglycemia: Secondary | ICD-10-CM | POA: Diagnosis present

## 2019-05-03 DIAGNOSIS — Z7951 Long term (current) use of inhaled steroids: Secondary | ICD-10-CM

## 2019-05-03 DIAGNOSIS — E118 Type 2 diabetes mellitus with unspecified complications: Secondary | ICD-10-CM | POA: Diagnosis not present

## 2019-05-03 DIAGNOSIS — E1365 Other specified diabetes mellitus with hyperglycemia: Secondary | ICD-10-CM | POA: Diagnosis not present

## 2019-05-03 DIAGNOSIS — Z79891 Long term (current) use of opiate analgesic: Secondary | ICD-10-CM | POA: Diagnosis not present

## 2019-05-03 DIAGNOSIS — W010XXA Fall on same level from slipping, tripping and stumbling without subsequent striking against object, initial encounter: Secondary | ICD-10-CM | POA: Diagnosis present

## 2019-05-03 DIAGNOSIS — T148XXA Other injury of unspecified body region, initial encounter: Secondary | ICD-10-CM

## 2019-05-03 DIAGNOSIS — Z20828 Contact with and (suspected) exposure to other viral communicable diseases: Secondary | ICD-10-CM | POA: Diagnosis present

## 2019-05-03 DIAGNOSIS — I10 Essential (primary) hypertension: Secondary | ICD-10-CM | POA: Diagnosis present

## 2019-05-03 DIAGNOSIS — S72011S Unspecified intracapsular fracture of right femur, sequela: Secondary | ICD-10-CM | POA: Diagnosis not present

## 2019-05-03 DIAGNOSIS — Z833 Family history of diabetes mellitus: Secondary | ICD-10-CM | POA: Diagnosis not present

## 2019-05-03 DIAGNOSIS — Z90722 Acquired absence of ovaries, bilateral: Secondary | ICD-10-CM | POA: Diagnosis not present

## 2019-05-03 DIAGNOSIS — Z7982 Long term (current) use of aspirin: Secondary | ICD-10-CM | POA: Diagnosis not present

## 2019-05-03 DIAGNOSIS — S52121A Displaced fracture of head of right radius, initial encounter for closed fracture: Secondary | ICD-10-CM

## 2019-05-03 DIAGNOSIS — S52121S Displaced fracture of head of right radius, sequela: Secondary | ICD-10-CM | POA: Diagnosis not present

## 2019-05-03 DIAGNOSIS — S72009A Fracture of unspecified part of neck of unspecified femur, initial encounter for closed fracture: Secondary | ICD-10-CM

## 2019-05-03 DIAGNOSIS — E669 Obesity, unspecified: Secondary | ICD-10-CM | POA: Diagnosis present

## 2019-05-03 DIAGNOSIS — I82432 Acute embolism and thrombosis of left popliteal vein: Secondary | ICD-10-CM | POA: Diagnosis not present

## 2019-05-03 DIAGNOSIS — K5901 Slow transit constipation: Secondary | ICD-10-CM | POA: Diagnosis not present

## 2019-05-03 LAB — BASIC METABOLIC PANEL
Anion gap: 10 (ref 5–15)
BUN: 10 mg/dL (ref 8–23)
CO2: 21 mmol/L — ABNORMAL LOW (ref 22–32)
Calcium: 8.5 mg/dL — ABNORMAL LOW (ref 8.9–10.3)
Chloride: 108 mmol/L (ref 98–111)
Creatinine, Ser: 0.72 mg/dL (ref 0.44–1.00)
GFR calc Af Amer: >60 mL/min (ref >60)
GFR calc non Af Amer: >60 mL/min (ref >60)
Glucose, Bld: 131 mg/dL — ABNORMAL HIGH (ref 70–99)
Potassium: 3.6 mmol/L (ref 3.5–5.1)
Sodium: 139 mmol/L (ref 135–145)

## 2019-05-03 LAB — CBC WITH DIFFERENTIAL/PLATELET
Abs Immature Granulocytes: 0.1 10*3/uL — ABNORMAL HIGH (ref 0.00–0.07)
Basophils Absolute: 0.1 10*3/uL (ref 0.0–0.1)
Basophils Relative: 0 %
Eosinophils Absolute: 0 10*3/uL (ref 0.0–0.5)
Eosinophils Relative: 0 %
HCT: 39.6 % (ref 36.0–46.0)
Hemoglobin: 13.2 g/dL (ref 12.0–15.0)
Immature Granulocytes: 1 %
Lymphocytes Relative: 7 %
Lymphs Abs: 0.9 10*3/uL (ref 0.7–4.0)
MCH: 31.2 pg (ref 26.0–34.0)
MCHC: 33.3 g/dL (ref 30.0–36.0)
MCV: 93.6 fL (ref 80.0–100.0)
Monocytes Absolute: 0.6 10*3/uL (ref 0.1–1.0)
Monocytes Relative: 5 %
Neutro Abs: 11.5 10*3/uL — ABNORMAL HIGH (ref 1.7–7.7)
Neutrophils Relative %: 87 %
Platelets: 188 10*3/uL (ref 150–400)
RBC: 4.23 MIL/uL (ref 3.87–5.11)
RDW: 14 % (ref 11.5–15.5)
WBC: 13.2 10*3/uL — ABNORMAL HIGH (ref 4.0–10.5)
nRBC: 0 % (ref 0.0–0.2)

## 2019-05-03 LAB — SURGICAL PCR SCREEN
MRSA, PCR: NEGATIVE
Staphylococcus aureus: POSITIVE — AB

## 2019-05-03 LAB — GLUCOSE, CAPILLARY
Glucose-Capillary: 103 mg/dL — ABNORMAL HIGH (ref 70–99)
Glucose-Capillary: 191 mg/dL — ABNORMAL HIGH (ref 70–99)

## 2019-05-03 LAB — TYPE AND SCREEN
ABO/RH(D): B POS
Antibody Screen: NEGATIVE

## 2019-05-03 LAB — SARS CORONAVIRUS 2 BY RT PCR (HOSPITAL ORDER, PERFORMED IN ~~LOC~~ HOSPITAL LAB): SARS Coronavirus 2: NEGATIVE

## 2019-05-03 MED ORDER — HYDROCODONE-ACETAMINOPHEN 5-325 MG PO TABS
1.0000 | ORAL_TABLET | Freq: Four times a day (QID) | ORAL | Status: DC | PRN
  Administered 2019-05-03: 1 via ORAL
  Administered 2019-05-04 – 2019-05-07 (×2): 2 via ORAL
  Filled 2019-05-03: qty 2
  Filled 2019-05-03: qty 1
  Filled 2019-05-03: qty 2

## 2019-05-03 MED ORDER — DOCUSATE SODIUM 100 MG PO CAPS
100.0000 mg | ORAL_CAPSULE | Freq: Two times a day (BID) | ORAL | Status: DC
  Administered 2019-05-03: 100 mg via ORAL
  Filled 2019-05-03 (×2): qty 1

## 2019-05-03 MED ORDER — ASPIRIN EC 81 MG PO TBEC
81.0000 mg | DELAYED_RELEASE_TABLET | Freq: Every day | ORAL | Status: DC
  Administered 2019-05-05 – 2019-05-06 (×2): 81 mg via ORAL
  Filled 2019-05-03 (×2): qty 1

## 2019-05-03 MED ORDER — ONDANSETRON HCL 4 MG/2ML IJ SOLN
4.0000 mg | Freq: Once | INTRAMUSCULAR | Status: AC
  Administered 2019-05-03: 4 mg via INTRAVENOUS
  Filled 2019-05-03: qty 2

## 2019-05-03 MED ORDER — LISINOPRIL 5 MG PO TABS: 5.0000 mg | ORAL_TABLET | Freq: Every day | ORAL | Status: DC

## 2019-05-03 MED ORDER — INSULIN ASPART 100 UNIT/ML ~~LOC~~ SOLN
0.0000 [IU] | Freq: Every day | SUBCUTANEOUS | Status: DC
  Administered 2019-05-04: 2 [IU] via SUBCUTANEOUS

## 2019-05-03 MED ORDER — BISACODYL 5 MG PO TBEC
5.0000 mg | DELAYED_RELEASE_TABLET | Freq: Every day | ORAL | Status: DC | PRN
  Administered 2019-05-07: 5 mg via ORAL
  Filled 2019-05-03: qty 1

## 2019-05-03 MED ORDER — POVIDONE-IODINE 10 % EX SWAB: 2.0000 "application " | Freq: Once | CUTANEOUS | Status: DC

## 2019-05-03 MED ORDER — FLUTICASONE PROPIONATE 50 MCG/ACT NA SUSP
2.0000 | Freq: Every day | NASAL | Status: DC
  Administered 2019-05-05 – 2019-05-07 (×3): 2 via NASAL
  Filled 2019-05-03 (×2): qty 16

## 2019-05-03 MED ORDER — POLYETHYLENE GLYCOL 3350 17 G PO PACK: 17.0000 g | PACK | Freq: Every day | ORAL | Status: DC | PRN

## 2019-05-03 MED ORDER — HYDROMORPHONE HCL 1 MG/ML IJ SOLN
1.0000 mg | Freq: Once | INTRAMUSCULAR | Status: AC
  Administered 2019-05-03: 1 mg via INTRAVENOUS
  Filled 2019-05-03: qty 1

## 2019-05-03 MED ORDER — METHOCARBAMOL 1000 MG/10ML IJ SOLN
500.0000 mg | Freq: Four times a day (QID) | INTRAVENOUS | Status: DC | PRN
  Filled 2019-05-03: qty 5

## 2019-05-03 MED ORDER — ONDANSETRON HCL 4 MG/2ML IJ SOLN
4.0000 mg | Freq: Four times a day (QID) | INTRAMUSCULAR | Status: DC | PRN
  Administered 2019-05-03 – 2019-05-04 (×2): 4 mg via INTRAVENOUS
  Filled 2019-05-03 (×2): qty 2

## 2019-05-03 MED ORDER — METHOCARBAMOL 500 MG PO TABS
500.0000 mg | ORAL_TABLET | Freq: Four times a day (QID) | ORAL | Status: DC | PRN
  Administered 2019-05-03: 500 mg via ORAL
  Filled 2019-05-03 (×4): qty 1

## 2019-05-03 MED ORDER — ENSURE PRE-SURGERY PO LIQD
296.0000 mL | Freq: Once | ORAL | Status: AC
  Administered 2019-05-03: 296 mL via ORAL
  Filled 2019-05-03: qty 296

## 2019-05-03 MED ORDER — CHLORHEXIDINE GLUCONATE 4 % EX LIQD
60.0000 mL | Freq: Once | CUTANEOUS | Status: DC
  Filled 2019-05-03: qty 60

## 2019-05-03 MED ORDER — INSULIN ASPART 100 UNIT/ML ~~LOC~~ SOLN
0.0000 [IU] | Freq: Three times a day (TID) | SUBCUTANEOUS | Status: DC
  Administered 2019-05-05: 2 [IU] via SUBCUTANEOUS
  Administered 2019-05-05 – 2019-05-06 (×3): 3 [IU] via SUBCUTANEOUS
  Administered 2019-05-06: 2 [IU] via SUBCUTANEOUS
  Administered 2019-05-06: 5 [IU] via SUBCUTANEOUS
  Administered 2019-05-07: 8 [IU] via SUBCUTANEOUS
  Administered 2019-05-07: 3 [IU] via SUBCUTANEOUS

## 2019-05-03 MED ORDER — MORPHINE SULFATE (PF) 2 MG/ML IV SOLN
0.5000 mg | INTRAVENOUS | Status: DC | PRN
  Administered 2019-05-03 – 2019-05-04 (×5): 0.5 mg via INTRAVENOUS
  Filled 2019-05-03 (×6): qty 1

## 2019-05-03 NOTE — ED Triage Notes (Signed)
Per [facility] pt coming from [place] where she works, tripped over something causing her to fall. Patient has shortened and rotated right leg. C/o pain to upper leg/ femur area. 229mcg fent given en route.

## 2019-05-03 NOTE — ED Notes (Signed)
Son Tavonna Rulli 336 [phone]
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
## 2019-05-03 NOTE — Progress Notes (Signed)
Orthopedic Tech Progress Note Patient Details:  Claudia [name] 1956-07-03 [id number]  Ortho Devices Type of Ortho Device: Long arm splint Ortho Device/Splint Interventions: Application, Ordered   Post Interventions Patient Tolerated: Well Instructions Provided: Care of device   Melony Overly T 05/03/2019, 2:08 PM
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
## 2019-05-04 ENCOUNTER — Inpatient Hospital Stay (HOSPITAL_COMMUNITY): Payer: No Typology Code available for payment source | Admitting: Anesthesiology

## 2019-05-04 ENCOUNTER — Encounter (HOSPITAL_COMMUNITY): Payer: Self-pay | Admitting: Anesthesiology

## 2019-05-04 ENCOUNTER — Inpatient Hospital Stay (HOSPITAL_COMMUNITY): Payer: No Typology Code available for payment source

## 2019-05-04 ENCOUNTER — Encounter (HOSPITAL_COMMUNITY)
Admission: EM | Disposition: A | Discharge status: Rehabilitation Facility | Payer: Self-pay | Source: Home / Self Care | Attending: Orthopedic Surgery

## 2019-05-04 DIAGNOSIS — S72011A Unspecified intracapsular fracture of right femur, initial encounter for closed fracture: Principal | ICD-10-CM

## 2019-05-04 DIAGNOSIS — E118 Type 2 diabetes mellitus with unspecified complications: Secondary | ICD-10-CM

## 2019-05-04 DIAGNOSIS — S52121A Displaced fracture of head of right radius, initial encounter for closed fracture: Secondary | ICD-10-CM

## 2019-05-04 DIAGNOSIS — E1165 Type 2 diabetes mellitus with hyperglycemia: Secondary | ICD-10-CM

## 2019-05-04 HISTORY — PX: APPLICATION OF WOUND VAC: SHX5189

## 2019-05-04 HISTORY — PX: TOTAL HIP ARTHROPLASTY: SHX124

## 2019-05-04 LAB — BASIC METABOLIC PANEL
Anion gap: 8 (ref 5–15)
BUN: 8 mg/dL (ref 8–23)
CO2: 23 mmol/L (ref 22–32)
Calcium: 8.4 mg/dL — ABNORMAL LOW (ref 8.9–10.3)
Chloride: 107 mmol/L (ref 98–111)
Creatinine, Ser: 0.82 mg/dL (ref 0.44–1.00)
GFR calc Af Amer: >60 mL/min (ref >60)
GFR calc non Af Amer: >60 mL/min (ref >60)
Glucose, Bld: 136 mg/dL — ABNORMAL HIGH (ref 70–99)
Potassium: 3.3 mmol/L — ABNORMAL LOW (ref 3.5–5.1)
Sodium: 138 mmol/L (ref 135–145)

## 2019-05-04 LAB — CBC
HCT: 40.2 % (ref 36.0–46.0)
Hemoglobin: 13.1 g/dL (ref 12.0–15.0)
MCH: 29.9 pg (ref 26.0–34.0)
MCHC: 32.6 g/dL (ref 30.0–36.0)
MCV: 91.8 fL (ref 80.0–100.0)
Platelets: 245 10*3/uL (ref 150–400)
RBC: 4.38 MIL/uL (ref 3.87–5.11)
RDW: 14.3 % (ref 11.5–15.5)
WBC: 10.5 10*3/uL (ref 4.0–10.5)
nRBC: 0 % (ref 0.0–0.2)

## 2019-05-04 LAB — GLUCOSE, CAPILLARY
Glucose-Capillary: 115 mg/dL — ABNORMAL HIGH (ref 70–99)
Glucose-Capillary: 137 mg/dL — ABNORMAL HIGH (ref 70–99)
Glucose-Capillary: 121 mg/dL — ABNORMAL HIGH (ref 70–99)
Glucose-Capillary: 222 mg/dL — ABNORMAL HIGH (ref 70–99)

## 2019-05-04 LAB — HIV ANTIBODY (ROUTINE TESTING W REFLEX): HIV Screen 4th Generation wRfx: NONREACTIVE

## 2019-05-04 SURGERY — ARTHROPLASTY, HIP, TOTAL, ANTERIOR APPROACH
Anesthesia: Spinal | Site: Hip | Laterality: Right

## 2019-05-04 MED ORDER — OXYCODONE HCL 5 MG PO TABS
5.0000 mg | ORAL_TABLET | ORAL | Status: DC | PRN
  Administered 2019-05-05: 5 mg via ORAL
  Administered 2019-05-05 – 2019-05-07 (×8): 10 mg via ORAL
  Filled 2019-05-04: qty 1
  Filled 2019-05-04 (×8): qty 2

## 2019-05-04 MED ORDER — FENTANYL CITRATE (PF) 250 MCG/5ML IJ SOLN
INTRAMUSCULAR | Status: AC
  Filled 2019-05-04: qty 5

## 2019-05-04 MED ORDER — OXYCODONE HCL 5 MG PO TABS
ORAL_TABLET | ORAL | Status: AC
  Filled 2019-05-04: qty 1

## 2019-05-04 MED ORDER — TRANEXAMIC ACID-NACL 1000-0.7 MG/100ML-% IV SOLN
INTRAVENOUS | Status: DC | PRN
  Administered 2019-05-04: 1000 mg via INTRAVENOUS

## 2019-05-04 MED ORDER — NAPROXEN 250 MG PO TABS
250.0000 mg | ORAL_TABLET | Freq: Two times a day (BID) | ORAL | Status: DC
  Administered 2019-05-04 – 2019-05-07 (×6): 250 mg via ORAL
  Filled 2019-05-04 (×7): qty 1

## 2019-05-04 MED ORDER — METOCLOPRAMIDE HCL 5 MG PO TABS: 5.0000 mg | ORAL_TABLET | Freq: Three times a day (TID) | ORAL | Status: DC | PRN

## 2019-05-04 MED ORDER — METHOCARBAMOL 500 MG PO TABS
500.0000 mg | ORAL_TABLET | Freq: Four times a day (QID) | ORAL | Status: DC | PRN
  Administered 2019-05-05 – 2019-05-07 (×3): 500 mg via ORAL

## 2019-05-04 MED ORDER — METOCLOPRAMIDE HCL 5 MG/ML IJ SOLN: 5.0000 mg | Freq: Three times a day (TID) | INTRAMUSCULAR | Status: DC | PRN

## 2019-05-04 MED ORDER — SODIUM CHLORIDE 0.9 % IV SOLN
INTRAVENOUS | Status: DC | PRN
  Administered 2019-05-04: 50 ug/min via INTRAVENOUS

## 2019-05-04 MED ORDER — ALBUMIN HUMAN 5 % IV SOLN
INTRAVENOUS | Status: AC
  Filled 2019-05-04: qty 250

## 2019-05-04 MED ORDER — VANCOMYCIN HCL 1000 MG IV SOLR
INTRAVENOUS | Status: DC | PRN
  Administered 2019-05-04: 1000 mg

## 2019-05-04 MED ORDER — LACTATED RINGERS IV SOLN
INTRAVENOUS | Status: DC
  Administered 2019-05-04 (×2): via INTRAVENOUS

## 2019-05-04 MED ORDER — MEPERIDINE HCL 25 MG/ML IJ SOLN: 6.2500 mg | INTRAMUSCULAR | Status: DC | PRN

## 2019-05-04 MED ORDER — MIDAZOLAM HCL 2 MG/2ML IJ SOLN
INTRAMUSCULAR | Status: AC
  Filled 2019-05-04: qty 2

## 2019-05-04 MED ORDER — BUPIVACAINE HCL (PF) 0.5 % IJ SOLN
INTRAMUSCULAR | Status: DC | PRN
  Administered 2019-05-04: 2.8 mL via INTRATHECAL

## 2019-05-04 MED ORDER — VANCOMYCIN HCL 1000 MG IV SOLR
INTRAVENOUS | Status: AC
  Filled 2019-05-04: qty 1000

## 2019-05-04 MED ORDER — TRANEXAMIC ACID-NACL 1000-0.7 MG/100ML-% IV SOLN
INTRAVENOUS | Status: AC
  Filled 2019-05-04: qty 100

## 2019-05-04 MED ORDER — DEXMEDETOMIDINE HCL 200 MCG/2ML IV SOLN
INTRAVENOUS | Status: DC | PRN
  Administered 2019-05-04 (×4): 8 ug via INTRAVENOUS

## 2019-05-04 MED ORDER — CEFAZOLIN SODIUM-DEXTROSE 2-4 GM/100ML-% IV SOLN
2.0000 g | INTRAVENOUS | Status: AC
  Administered 2019-05-04: 2 g via INTRAVENOUS

## 2019-05-04 MED ORDER — SODIUM CHLORIDE 0.9 % IR SOLN
Status: DC | PRN
  Administered 2019-05-04: 3000 mL

## 2019-05-04 MED ORDER — EPHEDRINE 5 MG/ML INJ
INTRAVENOUS | Status: AC
  Filled 2019-05-04: qty 10

## 2019-05-04 MED ORDER — DEXMEDETOMIDINE HCL IN NACL 200 MCG/50ML IV SOLN
INTRAVENOUS | Status: AC
  Filled 2019-05-04: qty 50

## 2019-05-04 MED ORDER — PROPOFOL 500 MG/50ML IV EMUL
INTRAVENOUS | Status: DC | PRN
  Administered 2019-05-04: 75 ug/kg/min via INTRAVENOUS

## 2019-05-04 MED ORDER — ACETAMINOPHEN 10 MG/ML IV SOLN
INTRAVENOUS | Status: AC
  Filled 2019-05-04: qty 100

## 2019-05-04 MED ORDER — CEFAZOLIN SODIUM-DEXTROSE 2-4 GM/100ML-% IV SOLN
INTRAVENOUS | Status: AC
  Filled 2019-05-04: qty 100

## 2019-05-04 MED ORDER — 0.9 % SODIUM CHLORIDE (POUR BTL) OPTIME
TOPICAL | Status: DC | PRN
  Administered 2019-05-04 (×5): 1000 mL

## 2019-05-04 MED ORDER — ALBUMIN HUMAN 5 % IV SOLN
12.5000 g | Freq: Once | INTRAVENOUS | Status: AC
  Administered 2019-05-04: 12.5 g via INTRAVENOUS

## 2019-05-04 MED ORDER — ONDANSETRON HCL 4 MG/2ML IJ SOLN
INTRAMUSCULAR | Status: AC
  Filled 2019-05-04: qty 2

## 2019-05-04 MED ORDER — ONDANSETRON HCL 4 MG PO TABS: 4.0000 mg | ORAL_TABLET | Freq: Four times a day (QID) | ORAL | Status: DC | PRN

## 2019-05-04 MED ORDER — POTASSIUM CHLORIDE CRYS ER 20 MEQ PO TBCR
50.0000 meq | EXTENDED_RELEASE_TABLET | Freq: Once | ORAL | Status: AC
  Administered 2019-05-04: 50 meq via ORAL
  Filled 2019-05-04: qty 1

## 2019-05-04 MED ORDER — MIDAZOLAM HCL 5 MG/5ML IJ SOLN
INTRAMUSCULAR | Status: DC | PRN
  Administered 2019-05-04: 2 mg via INTRAVENOUS

## 2019-05-04 MED ORDER — PHENYLEPHRINE HCL (PRESSORS) 10 MG/ML IV SOLN
INTRAVENOUS | Status: AC
  Filled 2019-05-04: qty 1

## 2019-05-04 MED ORDER — ACETAMINOPHEN 325 MG PO TABS
325.0000 mg | ORAL_TABLET | Freq: Four times a day (QID) | ORAL | Status: DC | PRN
  Administered 2019-05-05: 650 mg via ORAL
  Filled 2019-05-04: qty 2

## 2019-05-04 MED ORDER — LACTATED RINGERS IV SOLN
INTRAVENOUS | Status: AC
  Administered 2019-05-04: 18:00:00 via INTRAVENOUS

## 2019-05-04 MED ORDER — DOCUSATE SODIUM 100 MG PO CAPS
100.0000 mg | ORAL_CAPSULE | Freq: Two times a day (BID) | ORAL | Status: DC
  Administered 2019-05-04 – 2019-05-07 (×6): 100 mg via ORAL
  Filled 2019-05-04 (×6): qty 1

## 2019-05-04 MED ORDER — ACETAMINOPHEN 10 MG/ML IV SOLN
1000.0000 mg | Freq: Once | INTRAVENOUS | Status: DC | PRN
  Administered 2019-05-04: 1000 mg via INTRAVENOUS

## 2019-05-04 MED ORDER — OXYCODONE HCL 5 MG PO TABS
5.0000 mg | ORAL_TABLET | Freq: Once | ORAL | Status: AC | PRN
  Administered 2019-05-04: 5 mg via ORAL

## 2019-05-04 MED ORDER — CEFAZOLIN SODIUM-DEXTROSE 2-4 GM/100ML-% IV SOLN
2.0000 g | Freq: Four times a day (QID) | INTRAVENOUS | Status: AC
  Administered 2019-05-04 (×2): 2 g via INTRAVENOUS
  Filled 2019-05-04 (×2): qty 100

## 2019-05-04 MED ORDER — OXYCODONE HCL 5 MG/5ML PO SOLN: 5.0000 mg | Freq: Once | ORAL | Status: AC | PRN

## 2019-05-04 MED ORDER — FENTANYL CITRATE (PF) 100 MCG/2ML IJ SOLN
INTRAMUSCULAR | Status: DC | PRN
  Administered 2019-05-04 (×7): 50 ug via INTRAVENOUS

## 2019-05-04 MED ORDER — PHENYLEPHRINE 40 MCG/ML (10ML) SYRINGE FOR IV PUSH (FOR BLOOD PRESSURE SUPPORT)
PREFILLED_SYRINGE | INTRAVENOUS | Status: DC | PRN
  Administered 2019-05-04: 40 ug via INTRAVENOUS
  Administered 2019-05-04: 80 ug via INTRAVENOUS
  Administered 2019-05-04 (×2): 40 ug via INTRAVENOUS

## 2019-05-04 MED ORDER — METHOCARBAMOL 1000 MG/10ML IJ SOLN
500.0000 mg | Freq: Four times a day (QID) | INTRAVENOUS | Status: DC | PRN
  Filled 2019-05-04: qty 5

## 2019-05-04 MED ORDER — HYDROMORPHONE HCL 1 MG/ML IJ SOLN
0.5000 mg | INTRAMUSCULAR | Status: DC | PRN
  Administered 2019-05-04 – 2019-05-07 (×3): 1 mg via INTRAVENOUS
  Filled 2019-05-04 (×3): qty 1

## 2019-05-04 MED ORDER — NON FORMULARY
Status: DC | PRN
  Administered 2019-05-04: 450 mL

## 2019-05-04 MED ORDER — PHENYLEPHRINE 40 MCG/ML (10ML) SYRINGE FOR IV PUSH (FOR BLOOD PRESSURE SUPPORT)
PREFILLED_SYRINGE | INTRAVENOUS | Status: AC
  Filled 2019-05-04: qty 10

## 2019-05-04 MED ORDER — ENSURE ENLIVE PO LIQD
237.0000 mL | Freq: Every day | ORAL | Status: DC
  Administered 2019-05-06 – 2019-05-07 (×2): 237 mL via ORAL

## 2019-05-04 MED ORDER — ONDANSETRON HCL 4 MG/2ML IJ SOLN
4.0000 mg | Freq: Once | INTRAMUSCULAR | Status: AC | PRN
  Administered 2019-05-04: 4 mg via INTRAVENOUS

## 2019-05-04 MED ORDER — PRO-STAT SUGAR FREE PO LIQD
30.0000 mL | Freq: Every day | ORAL | Status: DC
  Administered 2019-05-05 – 2019-05-07 (×3): 30 mL via ORAL
  Filled 2019-05-04 (×3): qty 30

## 2019-05-04 MED ORDER — HYDROMORPHONE HCL 1 MG/ML IJ SOLN: 0.2500 mg | INTRAMUSCULAR | Status: DC | PRN

## 2019-05-04 MED ORDER — ONDANSETRON HCL 4 MG/2ML IJ SOLN
INTRAMUSCULAR | Status: DC | PRN
  Administered 2019-05-04: 4 mg via INTRAVENOUS

## 2019-05-04 MED ORDER — ASPIRIN 81 MG PO CHEW: 81.0000 mg | CHEWABLE_TABLET | Freq: Every day | ORAL | Status: DC

## 2019-05-04 MED ORDER — PROPOFOL 10 MG/ML IV BOLUS
INTRAVENOUS | Status: AC
  Filled 2019-05-04: qty 20

## 2019-05-04 MED ORDER — ONDANSETRON HCL 4 MG/2ML IJ SOLN: 4.0000 mg | Freq: Four times a day (QID) | INTRAMUSCULAR | Status: DC | PRN

## 2019-05-04 SURGICAL SUPPLY — 68 items
BAG DECANTER FOR FLEXI CONT (MISCELLANEOUS) ×2 IMPLANT
BLADE CLIPPER SURG (BLADE) ×2 IMPLANT
BLADE SAW SGTL 18X1.27X75 (BLADE) ×2 IMPLANT
BNDG COHESIVE 4X5 TAN STRL (GAUZE/BANDAGES/DRESSINGS) ×2 IMPLANT
BNDG COHESIVE 6X5 TAN STRL LF (GAUZE/BANDAGES/DRESSINGS) ×2 IMPLANT
BNDG ELASTIC 4X5.8 VLCR STR LF (GAUZE/BANDAGES/DRESSINGS) ×2 IMPLANT
CANISTER WOUND CARE 500ML ATS (WOUND CARE) ×2 IMPLANT
CELLS DAT CNTRL 66122 CELL SVR (MISCELLANEOUS) ×1 IMPLANT
COVER PERINEAL POST (MISCELLANEOUS) ×2 IMPLANT
COVER SURGICAL LIGHT HANDLE (MISCELLANEOUS) ×2 IMPLANT
COVER WAND RF STERILE (DRAPES) ×2 IMPLANT
DRAPE C-ARM 42X72 X-RAY (DRAPES) ×2 IMPLANT
DRAPE STERI IOBAN 125X83 (DRAPES) ×2 IMPLANT
DRAPE U-SHAPE 47X51 STRL (DRAPES) ×6 IMPLANT
DRSG AQUACEL AG ADV 3.5X10 (GAUZE/BANDAGES/DRESSINGS) ×2 IMPLANT
DURAPREP 26ML APPLICATOR (WOUND CARE) ×2 IMPLANT
ELECT BLADE 4.0 EZ CLEAN MEGAD (MISCELLANEOUS) ×2
ELECT REM PT RETURN 9FT ADLT (ELECTROSURGICAL) ×2
ELECTRODE BLDE 4.0 EZ CLN MEGD (MISCELLANEOUS) ×1 IMPLANT
ELECTRODE REM PT RTRN 9FT ADLT (ELECTROSURGICAL) ×1 IMPLANT
GLOVE BIOGEL PI IND STRL 7.0 (GLOVE) ×1 IMPLANT
GLOVE BIOGEL PI IND STRL 8 (GLOVE) ×1 IMPLANT
GLOVE BIOGEL PI IND STRL 8.5 (GLOVE) ×1 IMPLANT
GLOVE BIOGEL PI INDICATOR 7.0 (GLOVE) ×1
GLOVE BIOGEL PI INDICATOR 8 (GLOVE) ×1
GLOVE BIOGEL PI INDICATOR 8.5 (GLOVE) ×1
GLOVE ECLIPSE 8.0 STRL XLNG CF (GLOVE) ×4 IMPLANT
GLOVE SURG ORTHO 8.0 STRL STRW (GLOVE) ×4 IMPLANT
GLOVE SURG SS PI 6.5 STRL IVOR (GLOVE) ×2 IMPLANT
GOWN STRL REUS W/ TWL LRG LVL3 (GOWN DISPOSABLE) ×3 IMPLANT
GOWN STRL REUS W/TWL LRG LVL3 (GOWN DISPOSABLE) ×6
HANDPIECE INTERPULSE COAX TIP (DISPOSABLE) ×2
HEAD CERAMIC DELTA 36 PLUS 1.5 (Hips) ×2 IMPLANT
HOOD PEEL AWAY FLYTE STAYCOOL (MISCELLANEOUS) ×6 IMPLANT
JET LAVAGE IRRISEPT WOUND (IRRIGATION / IRRIGATOR) ×2
KIT BASIN OR (CUSTOM PROCEDURE TRAY) ×2 IMPLANT
KIT PREVENA INCISION MGT20CM45 (CANNISTER) ×2 IMPLANT
KIT TURNOVER KIT B (KITS) ×2 IMPLANT
LAVAGE JET IRRISEPT WOUND (IRRIGATION / IRRIGATOR) ×1 IMPLANT
LINER NEUTRAL 52X36MM PLUS 4 (Liner) ×2 IMPLANT
NEEDLE HYPO 22GX1.5 SAFETY (NEEDLE) ×2 IMPLANT
NS IRRIG 1000ML POUR BTL (IV SOLUTION) ×2 IMPLANT
PACK TOTAL JOINT (CUSTOM PROCEDURE TRAY) ×2 IMPLANT
PAD ARMBOARD 7.5X6 YLW CONV (MISCELLANEOUS) ×2 IMPLANT
PAD CAST 4YDX4 CTTN HI CHSV (CAST SUPPLIES) ×1 IMPLANT
PADDING CAST COTTON 4X4 STRL (CAST SUPPLIES) ×2
PIN SECTOR W/GRIP ACE CUP 52MM (Hips) ×2 IMPLANT
RTRCTR WOUND ALEXIS 18CM MED (MISCELLANEOUS) ×2
SCREW 6.5MMX25MM (Screw) ×2 IMPLANT
SET HNDPC FAN SPRY TIP SCT (DISPOSABLE) ×1 IMPLANT
SLING ARM FOAM STRAP MED (SOFTGOODS) ×2 IMPLANT
STEM CORAIL KA14 (Stem) ×2 IMPLANT
STRIP CLOSURE SKIN 1/2X4 (GAUZE/BANDAGES/DRESSINGS) ×2 IMPLANT
SUT ETHIBOND NAB CT1 #1 30IN (SUTURE) ×4 IMPLANT
SUT MNCRL AB 3-0 PS2 18 (SUTURE) ×2 IMPLANT
SUT VIC AB 0 CT1 27 (SUTURE) ×6
SUT VIC AB 0 CT1 27XBRD ANBCTR (SUTURE) ×3 IMPLANT
SUT VIC AB 1 CT1 27 (SUTURE) ×8
SUT VIC AB 1 CT1 27XBRD ANBCTR (SUTURE) ×4 IMPLANT
SUT VIC AB 2-0 CT1 27 (SUTURE) ×4
SUT VIC AB 2-0 CT1 TAPERPNT 27 (SUTURE) ×2 IMPLANT
SYR 30ML LL (SYRINGE) ×2 IMPLANT
TOWEL GREEN STERILE (TOWEL DISPOSABLE) ×2 IMPLANT
TOWEL GREEN STERILE FF (TOWEL DISPOSABLE) ×2 IMPLANT
TRAY FOLEY MTR SLVR 16FR STAT (SET/KITS/TRAYS/PACK) ×2 IMPLANT
TRAY FOLEY W/BAG SLVR 14FR (SET/KITS/TRAYS/PACK) ×2 IMPLANT
WATER STERILE IRR 1000ML POUR (IV SOLUTION) ×2 IMPLANT
YANKAUER SUCT BULB TIP NO VENT (SUCTIONS) ×2 IMPLANT

## 2019-05-04 NOTE — Op Note (Signed)
NAME: ANIKAH, MOWELL MEDICAL RECORD [id number] ACCOUNT [id number] DATE OF BIRTH:1955-09-14 FACILITY: [facility] LOCATION: [facility] PHYSICIAN:Gerrick Ray Randel Pigg, MD  OPERATIVE REPORT  DATE OF PROCEDURE:  05/04/2019  PREOPERATIVE DIAGNOSIS:  Right hip fracture.  POSTOPERATIVE DIAGNOSIS:  Right hip fracture.  PROCEDURE:  Right total hip arthroplasty, direct anterior approach.  SURGEON:  Meredith Pel, MD  ASSISTANT:  Annie Main, PA-C   INDICATIONS:  This is a patient who fell at work yesterday and presents now with right hip fracture and right radial head fracture.  Presents now for operative management after explanation of risks and benefits.  PROCEDURE IN DETAIL:  The patient was brought to the operating room where spinal anesthetic was induced.  Preoperative IV antibiotics were administered.  Time-out was called.  The patient was placed on the Hana bed with her left leg well padded.  The  right hip region was prescrubbed with alcohol and Betadine, allowed to air dry, prepped with DuraPrep solution, and draped in a sterile manner.  Charlie Pitter was used to cover the operative field.  An incision was made approximately 2.5 cm distal and inferior  to the anterior superior iliac crest.  It should be noted that due to the patient's body habitus, that anatomic landmark was difficult to localize.  There was also within her hip flexion crease some raw-looking skin.  Attempts were made to avoid crossing  the crease; however, we did have to go across the crease in order to achieve visualization.  The fascia lata was encountered and divided up to the anterior superior iliac spine.  Crossing cutaneous nerves were preserved when possible.  The plane between  the tensor and rectus was developed.  Crossing circumflex vessels were coagulated.  Retractors were placed on the superior and inferior aspect of the femoral neck.  The capsule was incised.  Accessory neck cut was made.  Attention then directed towards   removing the head.  The bent 90 retractor was placed along with another posterior retractor, and the labrum was circumferentially removed along with the palmar.  At this time, reaming was performed under fluoroscopic guidance in approximately 45 degrees  of abduction and 10 degrees of anteversion.  Reaming was performed up to a size 51, and a 52 cup was placed with good press fit obtained and 1 screw was also placed.  The +4 liner was placed and seated.  Attention then directed towards the femur.  The  patient's body habitus made access to the femur very difficult.  Incision extended proximal to the hip flexion crease.  The conjoined tendon was released.  Retractors were placed on the femoral neck as well as above the tip of the trochanter which had  ossification.  A box cutter reamer was utilized, and then broaching was performed up to a size 14.  Trial reduction was performed, and the patient had good stability as well as good leg lengths.  She had good stability in external rotation 45 degrees and  extension to 60 degrees.  At this time, trial components were removed on the femoral side.  Thorough irrigation was performed with 3 L of irrigating solution.  True components placed with a +1.5 mm ball which gave good leg lengths, good stability, and  no instability.  Thorough irrigation again performed.  Vancomycin placed within the joint itself.  The capsule was then closed using #1 Vicryl suture, followed by interrupted inverted #1 Vicryl suture to reapproximate the fascia lata, followed by 0  Vicryl suture, 2-0 Vicryl suture, and  a 3-0 Monocryl.  An incisional wound VAC was utilized because of the relative high risk of her incision along with her diabetes as well as her body habitus.  Leg lengths approximately equal at the conclusion of the  case.  The patient tolerated the procedure well without immediate complications.  Luke's assistance was required at all times for opening, closing, and retraction.   His assistance was medical necessity.  LN/NUANCE  D:05/04/2019 T:05/04/2019 JOB:[id number]

## 2019-05-04 NOTE — Progress Notes (Signed)
Patient presents for right hip replacement following femoral neck fracture which is displaced.  Risk benefits are discussed.  All questions answered  Radial head fracture may require rolling walker with platform attachment for the right-hand side

## 2019-05-04 NOTE — Plan of Care (Signed)
  Problem: Pain Managment: Goal: General experience of comfort will improve Outcome: Progressing   Problem: Elimination: Goal: Will not experience complications related to bowel motility Outcome: Progressing   

## 2019-05-04 NOTE — Transfer of Care (Signed)
Immediate Anesthesia Transfer of Care Note  Patient: Claudia [name]  Procedure(s) Performed: TOTAL HIP ARTHROPLASTY ANTERIOR APPROACH (Right Hip) Application Of Wound Vac (Right Hip)  Patient Location: PACU  Anesthesia Type:Spinal  Level of Consciousness: awake  Airway & Oxygen Therapy: Patient Spontanous Breathing and Patient connected to face mask oxygen  Post-op Assessment: Report given to RN and Post -op Vital signs reviewed and stable  Post vital signs: Reviewed and stable  Last Vitals:  Vitals Value Taken Time  BP    Temp    Pulse    Resp    SpO2      Last Pain:  Vitals:   05/04/19 0802  TempSrc: Oral  PainSc:       Patients Stated Pain Goal: 2 ([id number])  Complications: No apparent anesthesia complications

## 2019-05-04 NOTE — Progress Notes (Signed)
Initial Nutrition Assessment  DOCUMENTATION CODES:   Obesity unspecified  INTERVENTION:  Once diet advances,   Provide Ensure Enlive po once daily, each supplement provides 350 kcal and 20 grams of protein.  Provide 30 ml Prostat po once daily, each supplement provides 100 kcal and 15 grams of protein.  NUTRITION DIAGNOSIS:   Increased nutrient needs related to post-op healing as evidenced by estimated needs.  GOAL:   Patient will meet greater than or equal to 90% of their needs  MONITOR:   Supplement acceptance, Skin, Weight trends, Labs, I & O's, Diet advancement  REASON FOR ASSESSMENT:   Consult Hip fracture protocol  ASSESSMENT:   63 y.o. female with medical history significant of HTN; DM; and chronic pain from OA presenting with a fall. R subcapital fracture and R radial head fracture.  Pt currently in OR. Per MD, pt presents for right hip replacement following a displaced femoral neck fracture. RD to order nutritional supplements to aid in post op healing.   Unable to complete Nutrition-Focused physical exam at this time.   Labs and medications reviewed.   Diet Order:   Diet Order            Diet NPO time specified  Diet effective midnight              EDUCATION NEEDS:   Not appropriate for education at this time  Skin:  Skin Assessment: Skin Integrity Issues: Skin Integrity Issues:: Incisions, Wound VAC Wound Vac: R hip Incisions: R hip  Last BM:  Unknown  Height:   Ht Readings from Last 1 Encounters:  05/04/19 5' 7.5" (1.715 m)    Weight:   Wt Readings from Last 1 Encounters:  05/04/19 107.5 kg    Ideal Body Weight:  62.5 kg  BMI:  Body mass index is 36.57 kg/m.  Estimated Nutritional Needs:   Kcal:  1850-2050  Protein:  95-110 grams  Fluid:  >/= 1.8 L/day    Corrin Parker, MS, RD, LDN Pager # [phone] After hours/ weekend pager # [phone]

## 2019-05-04 NOTE — Progress Notes (Signed)
PROGRESS NOTE    Claudia [name]  [id number] DOB: 07/13/1956 DOA: 05/03/2019 PCP: Tammi Sou, MD   Brief Narrative:  63 y.o. WF PMHx HTN; HLD, chronic pain from OA, diabetes type 2 uncontrolled with complication,  Presenting with a fall at work.   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  She landed on her right hip and caught herself with her elbow.  She felt fine before this happened.   Subjective: A/O x4, negative CP, negative S OB, negative abdominal pain.  RIGHT hip pain appropriate for just returning from surgery   Assessment & Plan:   Principal Problem:   Hip fracture ([place]) Active Problems:   DM (diabetes mellitus), secondary uncontrolled ([place])   Obesity with body mass index of 30.0-39.9   Essential hypertension  RIGHT hip fracture - Total hip arthroplasty  Essential HTN -Patient's BP soft - Lisinopril 5 mg daily (hold)  Diabetes type 2 uncontrolled with complication -6/2 hemoglobin A1c= 7.9 - Moderate SSI - Consult to diabetic coordinator - Consult to nutrition; uncontrolled diabetic requires some teaching  Chronic pain syndrome - Per patient currently well controlled     DVT prophylaxis: We will need to start DVT prophylaxis Code Status: Full Family Communication: 9/22 sister at bedside Disposition Plan: TBD   Consultants:  Orthopedic surgery    Procedures/Significant Events:  9/22 RIGHT hip total hip arthroplasty anterior approach.      I have personally reviewed and interpreted all radiology studies and my findings are as above.  VENTILATOR SETTINGS:    Cultures   Antimicrobials:    Devices Wound VAC RIGHT hip 9/22>>   LINES / TUBES:      Continuous Infusions: . acetaminophen    . albumin human    .  ceFAZolin (ANCEF) IV    . lactated ringers    . methocarbamol (ROBAXIN) IV    . methocarbamol (ROBAXIN) IV       Objective: Vitals:   05/04/19 1600 05/04/19 1610  05/04/19 1625 05/04/19 1640  BP:  (!) 86/56 (!) 103/52 (!) 102/51  Pulse: 83 77 76 77  Resp: (!) 25 17 17 18   Temp:    98.3 F (36.8 C)  TempSrc:      SpO2: 100% 96% 95% 96%  Weight:      Height:        Intake/Output Summary (Last 24 hours) at 05/04/2019 1739 Last data filed at 05/04/2019 1605 Gross per 24 hour  Intake 1876 ml  Output 1400 ml  Net 476 ml   Filed Weights   05/03/19 1108 05/04/19 0906  Weight: 107.5 kg 107.5 kg    Examination:  General: A/O x4, no acute respiratory distress Eyes: negative scleral hemorrhage, negative anisocoria, negative icterus ENT: Negative Runny nose, negative gingival bleeding, Neck:  Negative scars, masses, torticollis, lymphadenopathy, JVD Lungs: Clear to auscultation bilaterally without wheezes or crackles Cardiovascular: Regular rate and rhythm without murmur gallop or rub normal S1 and S2 Abdomen: MORBIDLY OBESE negative abdominal pain, nondistended, positive soft, bowel sounds, no rebound, no ascites, no appreciable mass Extremities: RIGHT thigh with wound VAC in place minimal to no return.   Skin: Negative rashes, lesions, ulcers Psychiatric:  Negative depression, negative anxiety, negative fatigue, negative mania  Central nervous system:  Cranial nerves II through XII intact, tongue/uvula midline, all extremities muscle strength 5/5, sensation intact throughout, negative dysarthria, negative expressive aphasia, negative receptive aphasia.  .     Data Reviewed:  Care during the described time interval was provided by me .  I have reviewed this patient's available data, including medical history, events of note, physical examination, and all test results as part of my evaluation.   CBC: Recent Labs  Lab 05/03/19 1601 05/04/19 0348  WBC 13.2* 10.5  NEUTROABS 11.5*  --   HGB 13.2 13.1  HCT 39.6 40.2  MCV 93.6 91.8  PLT 188 [id number]   Basic Metabolic Panel: Recent Labs  Lab 05/03/19 1601 05/04/19 0348  NA 139 138  K 3.6 3.3*   CL 108 107  CO2 21* 23  GLUCOSE 131* 136*  BUN 10 8  CREATININE 0.72 0.82  CALCIUM 8.5* 8.4*   GFR: Estimated Creatinine Clearance: 89.5 mL/min (by C-G formula based on SCr of 0.82 mg/dL). Liver Function Tests: No results for input(s): AST, ALT, ALKPHOS, BILITOT, PROT, ALBUMIN in the last 168 hours. No results for input(s): LIPASE, AMYLASE in the last 168 hours. No results for input(s): AMMONIA in the last 168 hours. Coagulation Profile: No results for input(s): INR, PROTIME in the last 168 hours. Cardiac Enzymes: No results for input(s): CKTOTAL, CKMB, CKMBINDEX, TROPONINI in the last 168 hours. BNP (last 3 results) No results for input(s): PROBNP in the last 8760 hours. HbA1C: No results for input(s): HGBA1C in the last 72 hours. CBG: Recent Labs  Lab 05/03/19 1845 05/03/19 2041 05/04/19 0634 05/04/19 0906 05/04/19 1720  GLUCAP 103* 191* 115* 137* 121*   Lipid Profile: No results for input(s): CHOL, HDL, LDLCALC, TRIG, CHOLHDL, LDLDIRECT in the last 72 hours. Thyroid Function Tests: No results for input(s): TSH, T4TOTAL, FREET4, T3FREE, THYROIDAB in the last 72 hours. Anemia Panel: No results for input(s): VITAMINB12, FOLATE, FERRITIN, TIBC, IRON, RETICCTPCT in the last 72 hours. Urine analysis:    Component Value Date/Time   BILIRUBINUR negative 04/16/2017 0810   PROTEINUR trace 04/16/2017 0810   UROBILINOGEN 0.2 04/16/2017 0810   NITRITE negative 04/16/2017 0810   LEUKOCYTESUR Negative 04/16/2017 0810   Sepsis Labs: @LABRCNTIP (procalcitonin:4,lacticidven:4)  ) Recent Results (from the past 240 hour(s))  SARS Coronavirus 2 [facility] order, Performed in [facility] hospital lab) Nasopharyngeal Nasopharyngeal Swab     Status: None   Collection Time: 05/03/19 12:19 PM   Specimen: Nasopharyngeal Swab  Result Value Ref Range Status   SARS Coronavirus 2 NEGATIVE NEGATIVE Final    Comment: (NOTE) If result is NEGATIVE SARS-CoV-2 target nucleic acids are NOT  DETECTED. The SARS-CoV-2 RNA is generally detectable in upper and lower  respiratory specimens during the acute phase of infection. The lowest  concentration of SARS-CoV-2 viral copies this assay can detect is 250  copies / mL. A negative result does not preclude SARS-CoV-2 infection  and should not be used as the sole basis for treatment or other  patient management decisions.  A negative result may occur with  improper specimen collection / handling, submission of specimen other  than nasopharyngeal swab, presence of viral mutation(s) within the  areas targeted by this assay, and inadequate number of viral copies  (<250 copies / mL). A negative result must be combined with clinical  observations, patient history, and epidemiological information. If result is POSITIVE SARS-CoV-2 target nucleic acids are DETECTED. The SARS-CoV-2 RNA is generally detectable in upper and lower  respiratory specimens dur ing the acute phase of infection.  Positive  results are indicative of active infection with SARS-CoV-2.  Clinical  correlation with patient history and other diagnostic information is  necessary to determine patient infection  status.  Positive results do  not rule out bacterial infection or co-infection with other viruses. If result is PRESUMPTIVE POSTIVE SARS-CoV-2 nucleic acids MAY BE PRESENT.   A presumptive positive result was obtained on the submitted specimen  and confirmed on repeat testing.  While 2019 novel coronavirus  (SARS-CoV-2) nucleic acids may be present in the submitted sample  additional confirmatory testing may be necessary for epidemiological  and / or clinical management purposes  to differentiate between  SARS-CoV-2 and other Sarbecovirus currently known to infect humans.  If clinically indicated additional testing with an alternate test  methodology [phone]) is advised. The SARS-CoV-2 RNA is generally  detectable in upper and lower respiratory sp ecimens during  the acute  phase of infection. The expected result is Negative. Fact Sheet for Patients:  StrictlyIdeas.no Fact Sheet for Healthcare Providers: BankingDealers.co.za This test is not yet approved or cleared by the [place] FDA and has been authorized for detection and/or diagnosis of SARS-CoV-2 by FDA under an Emergency Use Authorization (EUA).  This EUA will remain in effect (meaning this test can be used) for the duration of the COVID-19 declaration under Section 564(b)(1) of the Act, 21 U.S.C. section 360bbb-3(b)(1), unless the authorization is terminated or revoked sooner. Performed at [facility], [place] [address] [place], [place] [address]   Surgical pcr screen     Status: Abnormal   Collection Time: 05/03/19  8:25 PM   Specimen: Nasal Mucosa; Nasal Swab  Result Value Ref Range Status   MRSA, PCR NEGATIVE NEGATIVE Final   Staphylococcus aureus POSITIVE (A) NEGATIVE Final    Comment: (NOTE) The Xpert SA Assay (FDA approved for NASAL specimens in patients 75 years of age and older), is one component of a comprehensive surveillance program. It is not intended to diagnose infection nor to guide or monitor treatment. Performed at [facility], [place] [address], [place],  [address]          Radiology Studies: Dg Elbow Complete Right  Result Date: 05/03/2019 CLINICAL DATA:  Right elbow pain after fall. EXAM: RIGHT ELBOW - COMPLETE 3+ VIEW COMPARISON:  None. FINDINGS: Minimally displaced radial head fracture is noted. Abnormal anterior and posterior fat pad displacement is noted suggesting underlying joint effusion. IMPRESSION: Minimally displaced radial head fracture. Electronically Signed   By: Marijo Conception M.D.   On: 05/03/2019 12:15   Dg Foot Complete Right  Result Date: 05/03/2019 CLINICAL DATA:  Right foot pain after injury at work. EXAM: RIGHT FOOT COMPLETE - 3+ VIEW COMPARISON:  None. FINDINGS: No  fracture or dislocation is noted. Mild degenerative changes seen involving first metatarsophalangeal joint. No soft tissue abnormality is noted. IMPRESSION: Mild osteoarthritis of the first metatarsophalangeal joint. No acute abnormality is noted. Electronically Signed   By: Marijo Conception M.D.   On: 05/03/2019 12:13   Dg C-arm 1-60 Min  Result Date: 05/04/2019 CLINICAL DATA:  Right hip arthroplasty EXAM: OPERATIVE RIGHT HIP (WITH PELVIS IF PERFORMED) 4 VIEWS TECHNIQUE: Fluoroscopic spot image(s) were submitted for interpretation post-operatively. COMPARISON:  None. FINDINGS: Intraoperative fluoroscopic images are provided. Interval right hip arthroplasty. IMPRESSION: Intraoperative localization. Electronically Signed   By: Kathreen Devoid   On: 05/04/2019 13:55   Dg Hip Operative Unilat W Or W/o Pelvis Right  Result Date: 05/04/2019 CLINICAL DATA:  Right hip arthroplasty EXAM: OPERATIVE RIGHT HIP (WITH PELVIS IF PERFORMED) 4 VIEWS TECHNIQUE: Fluoroscopic spot image(s) were submitted for interpretation post-operatively. COMPARISON:  None. FINDINGS: Intraoperative fluoroscopic images  are provided. Interval right hip arthroplasty. IMPRESSION: Intraoperative localization. Electronically Signed   By: Kathreen Devoid   On: 05/04/2019 13:55   Dg Hip Unilat With Pelvis 2-3 Views Right  Result Date: 05/03/2019 CLINICAL DATA:  Hip pain.  Mechanical fall EXAM: DG HIP (WITH OR WITHOUT PELVIS) 2-3V RIGHT COMPARISON:  None. FINDINGS: Fracture through the subcapital RIGHT femoral neck. Minimal angulation. Mild vertical migration. No dislocation. IMPRESSION: RIGHT subcapital femoral neck fracture. Electronically Signed   By: Suzy Bouchard M.D.   On: 05/03/2019 12:10        Scheduled Meds: . aspirin EC  81 mg Oral Daily  . docusate sodium  100 mg Oral BID  . [START ON 05/05/2019] feeding supplement (ENSURE ENLIVE)  237 mL Oral Q1500  . [START ON 05/05/2019] feeding supplement (PRO-STAT SUGAR FREE 64)  30 mL Oral  Daily  . fluticasone  2 spray Each Nare Daily  . insulin aspart  0-15 Units Subcutaneous TID WC  . insulin aspart  0-5 Units Subcutaneous QHS  . lisinopril  5 mg Oral Daily  . naproxen  250 mg Oral BID WC  . ondansetron      . oxyCODONE       Continuous Infusions: . acetaminophen    . albumin human    .  ceFAZolin (ANCEF) IV    . lactated ringers    . methocarbamol (ROBAXIN) IV    . methocarbamol (ROBAXIN) IV       LOS: 1 day   The patient is critically ill with multiple organ systems failure and requires high complexity decision making for assessment and support, frequent evaluation and titration of therapies, application of advanced monitoring technologies and extensive interpretation of multiple databases. Critical Care Time devoted to patient care services described in this note  Time spent: 40 minutes     , Geraldo Docker, MD [facility] Pager [phone]  If 7PM-7AM, please contact night-coverage [email or web address] Password [facility] 05/04/2019, 5:39 PM

## 2019-05-04 NOTE — Anesthesia Preprocedure Evaluation (Signed)
Anesthesia Evaluation  Patient identified by MRN, date of birth, ID band Patient awake    Reviewed: Allergy & Precautions, Patient's Chart, lab work & pertinent test results  Airway Mallampati: II  TM Distance: >3 FB Neck ROM: Full    Dental no notable dental hx. (+) Lower Dentures, Upper Dentures   Pulmonary neg pulmonary ROS,    Pulmonary exam normal breath sounds clear to auscultation       Cardiovascular hypertension, Normal cardiovascular exam Rhythm:Regular Rate:Normal     Neuro/Psych negative neurological ROS  negative psych ROS   GI/Hepatic negative GI ROS, Neg liver ROS,   Endo/Other  diabetes, Type 2, Oral Hypoglycemic Agents  Renal/GU negative Renal ROS  negative genitourinary   Musculoskeletal  (+) Arthritis , Osteoarthritis,    Abdominal (+) + obese,   Peds  Hematology negative hematology ROS (+)   Anesthesia Other Findings   Reproductive/Obstetrics negative OB ROS                             Anesthesia Physical Anesthesia Plan  ASA: III  Anesthesia Plan: Spinal   Post-op Pain Management:    Induction:   PONV Risk Score and Plan: Treatment may vary due to age or medical condition, Ondansetron and Dexamethasone  Airway Management Planned: Nasal Cannula and Natural Airway  Additional Equipment:   Intra-op Plan:   Post-operative Plan:   Informed Consent: I have reviewed the patients History and Physical, chart, labs and discussed the procedure including the risks, benefits and alternatives for the proposed anesthesia with the patient or authorized representative who has indicated his/her understanding and acceptance.     Dental advisory given  Plan Discussed with: CRNA and Anesthesiologist  Anesthesia Plan Comments:         Anesthesia Quick Evaluation

## 2019-05-04 NOTE — Anesthesia Procedure Notes (Signed)
Spinal  Patient location during procedure: OR Start time: 05/04/2019 10:56 AM End time: 05/04/2019 11:04 AM Staffing Anesthesiologist: Barnet Glasgow, MD Performed: anesthesiologist  Preanesthetic Checklist Completed: patient identified, site marked, surgical consent, pre-op evaluation, timeout performed, IV checked, risks and benefits discussed and monitors and equipment checked Spinal Block Patient position: sitting Prep: DuraPrep Patient monitoring: heart rate, cardiac monitor, continuous pulse ox and blood pressure Approach: midline Location: L3-4 Injection technique: single-shot Needle Needle type: Sprotte  Needle gauge: 24 G Needle length: 9 cm Needle insertion depth: 7 cm Assessment Sensory level: T4 Additional Notes I attempt Pt tolerated procedure .

## 2019-05-04 NOTE — Brief Op Note (Signed)
   05/03/2019 - 05/04/2019  2:39 PM  PATIENT:  Claudia [name]  63 y.o. female  PRE-OPERATIVE DIAGNOSIS:  Right hip fracture  POST-OPERATIVE DIAGNOSIS:  Right hip fracture  PROCEDURE:  Procedure(s): TOTAL HIP ARTHROPLASTY ANTERIOR APPROACH Application Of Wound Vac  SURGEON:  Surgeon(s): Meredith Pel, MD  ASSISTANT: magnant pa  ANESTHESIA:   spinal  EBL: 300 ml    Total I/O In: 1100 [I.V.:1100] Out: 300 [Blood:300]  BLOOD ADMINISTERED: none  DRAINS: none   LOCAL MEDICATIONS USED:  none  SPECIMEN:  No Specimen  COUNTS:  YES  TOURNIQUET:  * No tourniquets in log *  DICTATION: .Other Dictation: Dictation Number [id number]  PLAN OF CARE: Admit to inpatient   PATIENT DISPOSITION:  PACU - hemodynamically stable

## 2019-05-04 NOTE — Plan of Care (Signed)

## 2019-05-05 ENCOUNTER — Encounter (HOSPITAL_COMMUNITY): Payer: Self-pay | Admitting: Orthopedic Surgery

## 2019-05-05 LAB — CBC
HCT: 33.7 % — ABNORMAL LOW (ref 36.0–46.0)
Hemoglobin: 11.2 g/dL — ABNORMAL LOW (ref 12.0–15.0)
MCH: 30.4 pg (ref 26.0–34.0)
MCHC: 33.2 g/dL (ref 30.0–36.0)
MCV: 91.6 fL (ref 80.0–100.0)
Platelets: 218 10*3/uL (ref 150–400)
RBC: 3.68 MIL/uL — ABNORMAL LOW (ref 3.87–5.11)
RDW: 14.6 % (ref 11.5–15.5)
WBC: 11.7 10*3/uL — ABNORMAL HIGH (ref 4.0–10.5)
nRBC: 0 % (ref 0.0–0.2)

## 2019-05-05 LAB — PHOSPHORUS: Phosphorus: 2.9 mg/dL (ref 2.5–4.6)

## 2019-05-05 LAB — BASIC METABOLIC PANEL
Anion gap: 7 (ref 5–15)
BUN: 12 mg/dL (ref 8–23)
CO2: 23 mmol/L (ref 22–32)
Calcium: 8.2 mg/dL — ABNORMAL LOW (ref 8.9–10.3)
Chloride: 108 mmol/L (ref 98–111)
Creatinine, Ser: 0.96 mg/dL (ref 0.44–1.00)
GFR calc Af Amer: >60 mL/min (ref >60)
GFR calc non Af Amer: >60 mL/min (ref >60)
Glucose, Bld: 114 mg/dL — ABNORMAL HIGH (ref 70–99)
Potassium: 3.8 mmol/L (ref 3.5–5.1)
Sodium: 138 mmol/L (ref 135–145)

## 2019-05-05 LAB — GLUCOSE, CAPILLARY
Glucose-Capillary: 140 mg/dL — ABNORMAL HIGH (ref 70–99)
Glucose-Capillary: 145 mg/dL — ABNORMAL HIGH (ref 70–99)
Glucose-Capillary: 162 mg/dL — ABNORMAL HIGH (ref 70–99)
Glucose-Capillary: 194 mg/dL — ABNORMAL HIGH (ref 70–99)
Glucose-Capillary: 151 mg/dL — ABNORMAL HIGH (ref 70–99)

## 2019-05-05 LAB — MAGNESIUM: Magnesium: 2.1 mg/dL (ref 1.7–2.4)

## 2019-05-05 MED ORDER — CHLORHEXIDINE GLUCONATE CLOTH 2 % EX PADS
6.0000 | MEDICATED_PAD | Freq: Every day | CUTANEOUS | Status: DC
  Administered 2019-05-05 – 2019-05-07 (×3): 6 via TOPICAL

## 2019-05-05 MED ORDER — MUPIROCIN 2 % EX OINT
1.0000 "application " | TOPICAL_OINTMENT | Freq: Two times a day (BID) | CUTANEOUS | Status: DC
  Administered 2019-05-05 – 2019-05-07 (×5): 1 via NASAL
  Filled 2019-05-05: qty 22

## 2019-05-05 NOTE — Evaluation (Signed)
Physical Therapy Evaluation Patient Details Name: Claudia [name] MRN: [id number] DOB: 08/09/1956 Today's Date: 05/05/2019   History of Present Illness  64 y.o. female with medical history significant of HTN; DM; and chronic pain from OA presenting with a fall at work resulting in R radial head fx and R femoral neck fx. Pt now s/p R THA (direct anterior approach) and splingting of RUE.  Clinical Impression  Pt demonstrates deficits in gait, functional mobility, strength, power, balance, endurance, adherence to WB precautions. Pt requires assistance for all functional mobility and frequent verbal/tactile cues to maintain RUE WB restrictions during mobility. Pt will benefit from continued acute PT services to reduce assistance requirements during mobility and increase tolerance for OOB activity. PT currently recommends home with home health PT, hemi-walker, 3 in 1 commode, and physical assistance for all OOB activity from family.    Follow Up Recommendations Home health PT;Supervision/Assistance - 24 hour    Equipment Recommendations  3in1 (PT);Other (comment)(hemi-walker (DME needs could change pending WB status of RUE)    Recommendations for Other Services OT consult     Precautions / Restrictions Precautions Precautions: Fall Required Braces or Orthoses: Sling Restrictions Weight Bearing Restrictions: Yes RUE Weight Bearing: Non weight bearing RLE Weight Bearing: Weight bearing as tolerated      Mobility  Bed Mobility Overal bed mobility: Needs Assistance Bed Mobility: Supine to Sit     Supine to sit: Mod assist     General bed mobility comments: PT providing verbal cues to maintain NWB through RUE  Transfers Overall transfer level: Needs assistance Equipment used: Hemi-walker Transfers: Sit to/from Stand Sit to Stand: Min assist            Ambulation/Gait Ambulation/Gait assistance: Herbalist (Feet): 4 Feet Assistive device: Hemi-walker Gait  Pattern/deviations: Step-to pattern;Decreased step length - right;Decreased step length - left;Decreased stance time - right;Decreased stance time - left;Decreased weight shift to right Gait velocity: decreased significantly Gait velocity interpretation: <1.31 ft/sec, indicative of household Conservation officer, historic buildings Rankin (Stroke Patients Only)       Balance Overall balance assessment: Needs assistance Sitting-balance support: Single extremity supported Sitting balance-Leahy Scale: Fair     Standing balance support: Single extremity supported Standing balance-Leahy Scale: Fair                               Pertinent Vitals/Pain Pain Assessment: 0-10 Pain Score: 7  Pain Location: R hip Pain Descriptors / Indicators: Aching Pain Intervention(s): Limited activity within patient's tolerance    Home Living Family/patient expects to be discharged to:: Private residence Living Arrangements: Alone Available Help at Discharge: Family;Available 24 hours/day Type of Home: House Home Access: Stairs to enter Entrance Stairs-Rails: None Entrance Stairs-Number of Steps: 1 Home Layout: One level Home Equipment: Walker - 2 wheels;Shower seat      Prior Function Level of Independence: Independent         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     Hand Dominance   Dominant Hand: Right    Extremity/Trunk Assessment   Upper Extremity Assessment Upper Extremity Assessment: RUE deficits/detail(RUE ROM WFL, pt in sling for all mobility, strength not asse) RUE Sensation: WNL    Lower Extremity Assessment Lower Extremity Assessment: RLE deficits/detail RLE Deficits / Details: ROM WFL, Strength grossly 3+/5 RLE Sensation: WNL  Cervical / Trunk Assessment Cervical / Trunk Assessment: Normal  Communication   Communication: No difficulties  Cognition Arousal/Alertness: Awake/alert Behavior During Therapy: WFL  for tasks assessed/performed Overall Cognitive Status: Within Functional Limits for tasks assessed                                        General Comments      Exercises     Assessment/Plan    PT Assessment Patient needs continued PT services  PT Problem List Decreased strength;Decreased activity tolerance;Decreased balance;Decreased mobility;Decreased knowledge of use of DME;Decreased safety awareness;Decreased knowledge of precautions;Pain       PT Treatment Interventions DME instruction;Gait training;Stair training;Functional mobility training;Therapeutic activities;Therapeutic exercise;Balance training;Neuromuscular re-education;Patient/family education    PT Goals (Current goals can be found in the Care Plan section)  Acute Rehab PT Goals Patient Stated Goal: To return to prior level of function PT Goal Formulation: With patient Time For Goal Achievement: 05/19/19 Potential to Achieve Goals: Good    Frequency Min 5X/week   Barriers to discharge        Co-evaluation               AM-PAC PT "6 Clicks" Mobility  Outcome Measure Help needed turning from your back to your side while in a flat bed without using bedrails?: A Lot Help needed moving from lying on your back to sitting on the side of a flat bed without using bedrails?: A Lot Help needed moving to and from a bed to a chair (including a wheelchair)?: A Little Help needed standing up from a chair using your arms (e.g., wheelchair or bedside chair)?: A Little Help needed to walk in hospital room?: A Little Help needed climbing 3-5 steps with a railing? : A Lot 6 Click Score: 15    End of Session Equipment Utilized During Treatment: Gait belt Activity Tolerance: Patient limited by pain Patient left: in chair;with call bell/phone within reach;with chair alarm set;with family/visitor present Nurse Communication: Mobility status PT Visit Diagnosis: Other abnormalities of gait and mobility  (R26.89)    Time: [id number] PT Time Calculation (min) (ACUTE ONLY): 35 min   Charges:   PT Evaluation $PT Eval Moderate Complexity: 1 Mod PT Treatments $Therapeutic Activity: 8-22 mins        Zenaida Niece, PT, DPT Acute Rehabilitation Pager: [phone]   Zenaida Niece 05/05/2019, 1:18 PM

## 2019-05-05 NOTE — Progress Notes (Signed)
Inpatient Diabetes Program Recommendations  AACE/ADA: New Consensus Statement on Inpatient Glycemic Control (2015)  Target Ranges:  Prepandial:   less than 140 mg/dL      Peak postprandial:   less than 180 mg/dL (1-2 hours)      Critically ill patients:  140 - 180 mg/dL   Lab Results  Component Value Date   GLUCAP 162 (H) 05/05/2019   HGBA1C 7.9 (H) 01/12/2019    Review of Glycemic Control  Diabetes history: DM2 Outpatient Diabetes medications: Invokana 300 mg QD, glipizide 10 mg bid, Actos 30 mg QD Current orders for Inpatient glycemic control: Novolog 0-15 units tidwc and 0-5 units QHS  HgbA1C - 7.9% on 01/12/2019.Needs updating. CBGs 114-162 mg/dL today.  Inpatient Diabetes Program Recommendations:     Agree with orders.  Order Living Well with Diabetes book Need updated HgbA1C to assess glycemic control prior to admission. Lifestyle modification of weight loss and exercise imperative with controlling blood sugars.  Will f/u with pt in am.  Thank you. Lorenda Peck, RD, LDN, CDE Inpatient Diabetes Coordinator [phone]

## 2019-05-05 NOTE — Anesthesia Postprocedure Evaluation (Signed)
Anesthesia Post Note  Patient: Claudia [name]  Procedure(s) Performed: TOTAL HIP ARTHROPLASTY ANTERIOR APPROACH (Right Hip) Application Of Wound Vac (Right Hip)     Patient location during evaluation: PACU Anesthesia Type: Spinal and MAC Level of consciousness: awake and alert Pain management: pain level controlled Vital Signs Assessment: post-procedure vital signs reviewed and stable Respiratory status: spontaneous breathing, nonlabored ventilation, respiratory function stable and patient connected to nasal cannula oxygen Cardiovascular status: stable and blood pressure returned to baseline Postop Assessment: no apparent nausea or vomiting and spinal receding Anesthetic complications: no    Last Vitals:  Vitals:   05/05/19 0834 05/05/19 1537  BP: (!) 115/51 (!) 130/55  Pulse: 81 80  Resp: 19 19  Temp: 37.1 C 37.4 C  SpO2: 96% 100%    Last Pain:  Vitals:   05/05/19 1700  TempSrc:   PainSc: 5                  Roneshia Drew

## 2019-05-05 NOTE — Progress Notes (Addendum)
Nutrition Follow-up  DOCUMENTATION CODES:   Obesity unspecified  INTERVENTION:  Provide Ensure Enlive po once daily, each supplement provides 350 kcal and 20 grams of protein.  Provide 30 ml Prostat po once daily, each supplement provides 100 kcal and 15 grams of protein.  Diet handout regarding diabetes given.  Encourage adequate PO intake.   NUTRITION DIAGNOSIS:   Increased nutrient needs related to post-op healing as evidenced by estimated needs; ongoing  GOAL:   Patient will meet greater than or equal to 90% of their needs; progressing  MONITOR:   Supplement acceptance, Skin, Weight trends, Labs, I & O's, Diet advancement  REASON FOR ASSESSMENT:   Consult Hip fracture protocol  ASSESSMENT:   63 y.o. female with medical history significant of HTN; DM; and chronic pain from OA presenting with a fall. R subcapital fracture and R radial head fracture.  PROCEDURE (9/22): TOTAL HIP ARTHROPLASTY ANTERIOR APPROACH Application Of Wound Vac   Pt reports having a good appetite currently and PTA with no difficulties. Pt with no weight loss per weight records. Pt currently has Ensure and Prostat ordered and has been consuming them. RD to continue with current orders to aid in post op healing. RD consulted for diet education regarding diabetes. Handout "Carbohydrate Counting for People with Diabetes" from the Academy of Nutrition and Dietetics Manual given and placed in pt discharge instructions.  NUTRITION - FOCUSED PHYSICAL EXAM:    Most Recent Value  Orbital Region  No depletion  Upper Arm Region  No depletion  Thoracic and Lumbar Region  No depletion  Buccal Region  No depletion  Temple Region  No depletion  Clavicle Bone Region  No depletion  Clavicle and Acromion Bone Region  No depletion  Scapular Bone Region  No depletion  Dorsal Hand  No depletion  Patellar Region  No depletion  Anterior Thigh Region  No depletion  Posterior Calf Region  No depletion  Edema  (RD Assessment)  Mild  Hair  Reviewed  Eyes  Reviewed  Mouth  Reviewed  Skin  Reviewed  Nails  Reviewed     Labs and medications reviewed.   Diet Order:   Diet Order            Diet Carb Modified Fluid consistency: Thin; Room service appropriate? Yes  Diet effective now              EDUCATION NEEDS:   Not appropriate for education at this time  Skin:  Skin Assessment: Skin Integrity Issues: Skin Integrity Issues:: Incisions, Wound VAC Wound Vac: R hip Incisions: R hip  Last BM:  9/21  Height:   Ht Readings from Last 1 Encounters:  05/04/19 5' 7.5" (1.715 m)    Weight:   Wt Readings from Last 1 Encounters:  05/04/19 107.5 kg    Ideal Body Weight:  62.5 kg  BMI:  Body mass index is 36.57 kg/m.  Estimated Nutritional Needs:   Kcal:  1850-2050  Protein:  95-110 grams  Fluid:  >/= 1.8 L/day    Claudia Parker, MS, RD, LDN Pager # [phone] After hours/ weekend pager # [phone]

## 2019-05-05 NOTE — Progress Notes (Signed)
  Subjective: Patient stable.  Making moderate progress in therapy.  She has been up in the room today.   Objective: Vital signs in last 24 hours: Temp:  [97.1 F (36.2 C)-98.7 F (37.1 C)] 98.7 F (37.1 C) (09/23 0834) Pulse Rate:  [73-85] 81 (09/23 0834) Resp:  [12-35] 19 (09/23 0834) BP: (85-115)/(51-63) 115/51 (09/23 0834) SpO2:  [81 %-100 %] 96 % (09/23 0834)  Intake/Output from previous day: 09/22 0701 - 09/23 0700 In: 1100 [I.V.:1100] Out: 1350 [Urine:1050; Blood:300] Intake/Output this shift: No intake/output data recorded.  Exam:  Dorsiflexion/Plantar flexion intact  Labs: Recent Labs    05/03/19 1601 05/04/19 0348 05/05/19 0315  HGB 13.2 13.1 11.2*   Recent Labs    05/04/19 0348 05/05/19 0315  WBC 10.5 11.7*  RBC 4.38 3.68*  HCT 40.2 33.7*  PLT 245 218   Recent Labs    05/04/19 0348 05/05/19 0315  NA 138 138  K 3.3* 3.8  CL 107 108  CO2 23 23  BUN 8 12  CREATININE 0.82 0.96  GLUCOSE 136* 114*  CALCIUM 8.4* 8.2*   No results for input(s): LABPT, INR in the last 72 hours.  Assessment/Plan: Plan at this time is continue with therapy.  She will need to progress to weightbearing as tolerated without bearing weight through the right arm.  She is doing okay with range of motion of the right arm but I do not want her loading that arm when she is getting up from a chair.   [name] 05/05/2019, 12:48 PM

## 2019-05-05 NOTE — Discharge Instructions (Signed)
Carbohydrate Counting For People With Diabetes  Why Is Carbohydrate Counting Important?  Counting carbohydrate servings may help you control your blood glucose level so that you feel better.   The balance between the carbohydrates you eat and insulin determines what your blood glucose level will be after eating.   Carbohydrate counting can also help you plan your meals. Which Foods Have Carbohydrates? Foods with carbohydrates include:  Breads, crackers, and cereals   Pasta, rice, and grains   Starchy vegetables, such as potatoes, corn, and peas   Beans and legumes   Milk, soy milk, and yogurt   Fruits and fruit juices   Sweets, such as cakes, cookies, ice cream, jam, and jelly Carbohydrate Servings In diabetes meal planning, 1 serving of a food with carbohydrate has about 15 grams of carbohydrate:  Check serving sizes with measuring cups and spoons or a food scale.   Read the Nutrition Facts on food labels to find out how many grams of carbohydrate are in foods you eat. The food lists in this handout show portions that have about 15 grams of carbohydrate.  Tips Meal Planning Tips  An Eating Plan tells you how many carbohydrate servings to eat at your meals and snacks. For many adults, eating 3 to 5 servings of carbohydrate foods at each meal and 1 or 2 carbohydrate servings for each snack works well.   In a healthy daily Eating Plan, most carbohydrates come from:   At least 6 servings of fruits and nonstarchy vegetables   At least 6 servings of grains, beans, and starchy vegetables, with at least 3 servings from whole grains   At least 2 servings of milk or milk products  Check your blood glucose level regularly. It can tell you if you need to adjust when you eat carbohydrates.   Eating foods that have fiber, such as whole grains, and having very few salty foods is good for your health.   Eat 4 to 6 ounces of meat or other protein foods (such as soybean burgers)  each day. Choose low-fat sources of protein, such as lean beef, lean pork, chicken, fish, low-fat cheese, or vegetarian foods such as soy.   Eat some healthy fats, such as olive oil, canola oil, and nuts.   Eat very little saturated fats. These unhealthy fats are found in butter, cream, and high-fat meats, such as bacon and sausage.   Eat very little or no trans fats. These unhealthy fats are found in all foods that list partially hydrogenated oil as an ingredient.  Label Reading Tips The Nutrition Facts panel on a label lists the grams of total carbohydrate in 1 standard serving. The label's standard serving may be larger or smaller than 1 carbohydrate serving. To figure out how many carbohydrate servings are in the food:  First, look at the label's standard serving size.   Check the grams of total carbohydrate. This is the amount of carbohydrate in 1 standard serving.   Divide the grams of total carbohydrate by 15. This number equals the number of carbohydrate servings in 1 standard serving. Remember: 1 carbohydrate serving is 15 grams of carbohydrate.   Note: You may ignore the grams of sugars on the Nutrition Facts panel because they are included in the grams of total carbohydrate.  Foods Recommended 1 serving = about 15 grams of carbohydrate Starches  1 slice bread (1 ounce)   1 tortilla (6-inch size)    large bagel (1 ounce)   2 taco shells (  size)  °• ½ hamburger or hot dog bun (¾ ounce)  °• ¾ cup ready-to-eat unsweetened cereal  °• ½ cup cooked cereal  °• 1 cup broth-based soup  °• 4 to 6 small crackers  °• 1/3 cup pasta or rice (cooked)  °• ½ cup beans, peas, corn, sweet potatoes, winter squash, or mashed or boiled potatoes (cooked)  °• ¼ large baked potato (3 ounces)  °• ¾ ounce pretzels, potato chips, or tortilla chips  °• 3 cups popcorn (popped) °Fruit °• 1 small fresh fruit (¾ to 1 cup)  °• ½ cup canned or frozen fruit  °• 2 tablespoons dried fruit (blueberries,  cherries, cranberries, mixed fruit, raisins)  °• 17 small grapes (3 ounces)  °• 1 cup melon or berries  °• ½ cup unsweetened fruit juice °Milk °• 1 cup fat-free or reduced-fat milk  °• 1 cup soy milk  °• 2/3 cup (6 ounces) nonfat yogurt sweetened with sugar-free sweetener °Sweets and Desserts °• 2-inch square cake (unfrosted)  °• 2 small cookies (2/3 ounce)  °• ½ cup ice cream or frozen yogurt  °• ¼ cup sherbet or sorbet  °• 1 tablespoon syrup, jam, jelly, table sugar, or honey  °• 2 tablespoons light syrup °Other Foods °• Count 1 cup raw vegetables or ½ cup cooked nonstarchy vegetables as zero (0) carbohydrate servings or “free” foods. If you eat 3 or more servings at one meal, count them as 1 carbohydrate serving.  °• Foods that have less than 20 calories in each serving also may be counted as zero carbohydrate servings or “free” foods.  °• Count 1 cup of casserole or other mixed foods as 2 carbohydrate servings. ° °Carbohydrate Counting for People with Diabetes Sample 1-Day Menu  °Breakfast 1 extra-small banana (1 carbohydrate serving)  °1 cup low-fat or fat-free milk (1 carbohydrate serving)  °1 slice whole wheat bread (1 carbohydrate serving)  °1 teaspoon margarine  °Lunch 2 ounces turkey slices  °2 slices whole wheat bread (2 carbohydrate servings)  °2 lettuce leaves  °4 celery sticks  °4 carrot sticks  °1 medium apple (1 carbohydrate serving)  °1 cup low-fat or fat-free milk (1 carbohydrate serving)  °Afternoon Snack 2 tablespoons raisins (1 carbohydrate serving)  °3/4 ounce unsalted mini pretzels (1 carbohydrate serving)  °Evening Meal 3 ounces lean roast beef  °1/2 large baked potato (2 carbohydrate servings)  °1 tablespoon reduced-fat sour cream  °1/2 cup green beans  °1 tablespoon light salad dressing  °1 whole wheat dinner roll (1 carbohydrate serving)  °1 teaspoon margarine  °1 cup melon balls (1 carbohydrate serving)  °Evening Snack 2 tablespoons unsalted nuts  ° °Carbohydrate Counting for People with  Diabetes Vegan Sample 1-Day Menu  °Breakfast 1 cup cooked oatmeal (2 carbohydrate servings)  °½ cup blueberries (1 carbohydrate serving)  °2 tablespoons flaxseeds  °1 cup soymilk fortified with calcium and vitamin D  °1 cup coffee  °Lunch 2 slices whole wheat bread (2 carbohydrate servings)  °½ cup baked tofu  °¼ cup lettuce  °2 slices tomato  °2 slices avocado  °½ cup baby carrots  °1 orange (1 carbohydrate serving)  °1 cup soymilk fortified with calcium and vitamin D   °Evening Meal Burrito made with: 1 6-inch corn tortilla (1 carbohydrate serving)  °1 cup refried vegetarian beans (1 carbohydrate serving)  °¼ cup chopped tomatoes  °¼ cup lettuce  °¼ cup salsa  °1/3 cup brown rice (1 carbohydrate serving)  °1 tablespoon olive oil for rice  °½   cup zucchini   Evening Snack 6 small whole grain crackers (1 carbohydrate serving)  2 apricots ( carbohydrate serving)   cup unsalted peanuts ( carbohydrate serving)     Carbohydrate Counting for People with Diabetes Vegetarian (Lacto-Ovo) Sample 1-Day Menu  Breakfast 1 cup cooked oatmeal (2 carbohydrate servings)   cup blueberries (1 carbohydrate serving)  2 tablespoons flaxseeds  1 egg  1 cup 1% milk (1 carbohydrate serving)  1 cup coffee  Lunch 2 slices whole wheat bread (2 carbohydrate servings)  2 ounces low-fat cheese   cup lettuce  2 slices tomato  2 slices avocado   cup baby carrots  1 orange (1 carbohydrate serving)  1 cup unsweetened tea  Evening Meal Burrito made with: 1 6-inch corn tortilla (1 carbohydrate serving)   cup refried vegetarian beans (1 carbohydrate serving)   cup tomatoes   cup lettuce   cup salsa  1/3 cup brown rice (1 carbohydrate serving)  1 tablespoon olive oil for rice   cup zucchini  1 cup 1% milk (1 carbohydrate serving)  Evening Snack 6 small whole grain crackers (1 carbohydrate serving)  2 apricots ( carbohydrate serving)   cup unsalted peanuts ( carbohydrate serving)    Copyright 2020  Academy  [employer]. All rights reserved.  Using Nutrition Labels: Carbohydrate   Serving Size   Look at the serving size. All the information on the label is based on this portion.  Servings Per Container   The number of servings contained in the package.  Guidelines for Carbohydrate   Look at the total grams of carbohydrate in the serving size.   1 carbohydrate choice = 15 grams of carbohydrate. Range of Carbohydrate Grams Per Choice  Carbohydrate Grams/Choice Carbohydrate Choices  6-10   11-20 1  21-25 1  26-35 2  36-40 2  41-50 3  51-55 3  56-65 4  66-70 4  71-80 5    Copyright 2020  [employer]y [employer]. All rights reserved.  Corrin Parker, MS, [place], LDN Clinical Dietitian Office phone # [phone]

## 2019-05-05 NOTE — Progress Notes (Signed)
PROGRESS NOTE    Claudia [name]  [id number] DOB: [birth year] DOA: 05/03/2019 PCP: Tammi Sou, MD   Brief Narrative:  63 y.o. WF PMHx HTN; HLD, chronic pain from OA, diabetes type 2 uncontrolled with complication. Presenting with a fall at work.   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  She landed on her right hip and caught herself with her elbow.  She felt fine before this happened.  Subjective: No acute issues or events overnight, pain well controlled. Somewhat somnolent this am but denies chest pain, shortness of breath, headache, fevers, chills.  Assessment & Plan:   Principal Problem:   Hip fracture ([place]) Active Problems:   DM (diabetes mellitus), secondary uncontrolled ([place])   Obesity with body mass index of 30.0-39.9   Essential hypertension   RIGHT hip fracture 2/2 mechanical fall, POA - S/P R total hip arthroplasty - Per ortho:             -Prevena wound vac for 5 days              -WBAT RLE with a platform walker             -NWB to RUE. Continue sling use             -F/u with Dr. Marlou Sa in clinic 2 weeks following surgery  Essential HTN - BP remains well controlled off medications - Continue to hold Lisinopril  Non insulin dependent diabetes type 2 uncontrolled without complication -6/2 hemoglobin A1c= 7.9 - Continue moderate SSI - Consult to diabetic coordinator - Consult to nutrition; uncontrolled diabetic requires some teaching  Chronic pain syndrome - Per patient currently well controlled  DVT prophylaxis: Per orthopedic Sx Code Status: Full Family communication: Family not present Disposition Plan: TBD - pending PT/OT eval and treatment    Consultants: Orthopedic surgery  Procedures: 9/22 RIGHT hip total hip arthroplasty anterior approach.  I have personally reviewed and interpreted all radiology studies and my findings are as above.   Objective: Vitals:   05/04/19 1640 05/04/19 2015  05/04/19 2347 05/05/19 0450  BP: (!) 102/51 (!) 109/55 (!) 108/52 (!) 104/54  Pulse: 77 85 80 81  Resp: 18 19 18 18   Temp: 98.3 F (36.8 C) 98.7 F (37.1 C) 98.6 F (37 C) 98.4 F (36.9 C)  TempSrc:  Oral Oral Oral  SpO2: 96% 91% 96% 92%  Weight:      Height:        Intake/Output Summary (Last 24 hours) at 05/05/2019 0725 Last data filed at 05/05/2019 0500 Gross per 24 hour  Intake 1100 ml  Output 1350 ml  Net -250 ml   Filed Weights   05/03/19 1108 05/04/19 0906  Weight: 107.5 kg 107.5 kg    Examination:  General: A/O x4, no acute respiratory distress Eyes: negative scleral hemorrhage, negative anisocoria, negative icterus ENT: Negative Runny nose, negative gingival bleeding, Neck:  Negative scars, masses, torticollis, lymphadenopathy, JVD Lungs: Clear to auscultation bilaterally without wheezes or crackles Cardiovascular: Regular rate and rhythm without murmur gallop or rub normal S1 and S2 Abdomen: MORBIDLY OBESE negative abdominal pain, nondistended, positive soft, bowel sounds, no rebound, no ascites, no appreciable mass Extremities: RIGHT thigh with wound VAC in place minimal to no return.   Skin: Negative rashes, lesions, ulcers Psychiatric:  Negative depression, negative anxiety, negative fatigue, negative mania  Central nervous system:  Cranial nerves II through XII intact, tongue/uvula midline, all extremities  muscle strength 5/5, sensation intact throughout, negative dysarthria, negative expressive aphasia, negative receptive aphasia.  Data Reviewed: Care during the described time interval was provided by me .  I have reviewed this patient's available data, including medical history, events of note, physical examination, and all test results as part of my evaluation.  CBC: Recent Labs  Lab 05/03/19 1601 05/04/19 0348 05/05/19 0315  WBC 13.2* 10.5 11.7*  NEUTROABS 11.5*  --   --   HGB 13.2 13.1 11.2*  HCT 39.6 40.2 33.7*  MCV 93.6 91.8 91.6  PLT 188 245  [id number]   Basic Metabolic Panel: Recent Labs  Lab 05/03/19 1601 05/04/19 0348 05/05/19 0315  NA 139 138 138  K 3.6 3.3* 3.8  CL 108 107 108  CO2 21* 23 23  GLUCOSE 131* 136* 114*  BUN 10 8 12   CREATININE 0.72 0.82 0.96  CALCIUM 8.5* 8.4* 8.2*  MG  --   --  2.1  PHOS  --   --  2.9   GFR: Estimated Creatinine Clearance: 76.4 mL/min (by C-G formula based on SCr of 0.96 mg/dL).  CBG: Recent Labs  Lab 05/04/19 0634 05/04/19 0906 05/04/19 1720 05/04/19 2137 05/05/19 0609  GLUCAP 115* 137* 121* 222* 140*    Recent Results (from the past 240 hour(s))  SARS Coronavirus 2 [facility] order, Performed in [facility] hospital lab) Nasopharyngeal Nasopharyngeal Swab     Status: None   Collection Time: 05/03/19 12:19 PM   Specimen: Nasopharyngeal Swab  Result Value Ref Range Status   SARS Coronavirus 2 NEGATIVE NEGATIVE Final    Comment: (NOTE) If result is NEGATIVE SARS-CoV-2 target nucleic acids are NOT DETECTED. The SARS-CoV-2 RNA is generally detectable in upper and lower  respiratory specimens during the acute phase of infection. The lowest  concentration of SARS-CoV-2 viral copies this assay can detect is 250  copies / mL. A negative result does not preclude SARS-CoV-2 infection  and should not be used as the sole basis for treatment or other  patient management decisions.  A negative result may occur with  improper specimen collection / handling, submission of specimen other  than nasopharyngeal swab, presence of viral mutation(s) within the  areas targeted by this assay, and inadequate number of viral copies  (<250 copies / mL). A negative result must be combined with clinical  observations, patient history, and epidemiological information. If result is POSITIVE SARS-CoV-2 target nucleic acids are DETECTED. The SARS-CoV-2 RNA is generally detectable in upper and lower  respiratory specimens dur ing the acute phase of infection.  Positive  results are indicative of active  infection with SARS-CoV-2.  Clinical  correlation with patient history and other diagnostic information is  necessary to determine patient infection status.  Positive results do  not rule out bacterial infection or co-infection with other viruses. If result is PRESUMPTIVE POSTIVE SARS-CoV-2 nucleic acids MAY BE PRESENT.   A presumptive positive result was obtained on the submitted specimen  and confirmed on repeat testing.  While 2019 novel coronavirus  (SARS-CoV-2) nucleic acids may be present in the submitted sample  additional confirmatory testing may be necessary for epidemiological  and / or clinical management purposes  to differentiate between  SARS-CoV-2 and other Sarbecovirus currently known to infect humans.  If clinically indicated additional testing with an alternate test  methodology [phone]) is advised. The SARS-CoV-2 RNA is generally  detectable in upper and lower respiratory sp ecimens during the acute  phase of infection. The expected result is Negative.  Fact Sheet for Patients:  StrictlyIdeas.no Fact Sheet for Healthcare Providers: BankingDealers.co.za This test is not yet approved or cleared by the [place] FDA and has been authorized for detection and/or diagnosis of SARS-CoV-2 by FDA under an Emergency Use Authorization (EUA).  This EUA will remain in effect (meaning this test can be used) for the duration of the COVID-19 declaration under Section 564(b)(1) of the Act, 21 U.S.C. section 360bbb-3(b)(1), unless the authorization is terminated or revoked sooner. Performed at [facility], [place] [address], [place], [place] [address]   Surgical pcr screen     Status: Abnormal   Collection Time: 05/03/19  8:25 PM   Specimen: Nasal Mucosa; Nasal Swab  Result Value Ref Range Status   MRSA, PCR NEGATIVE NEGATIVE Final   Staphylococcus aureus POSITIVE (A) NEGATIVE Final    Comment: (NOTE) The Xpert SA Assay (FDA  approved for NASAL specimens in patients 13 years of age and older), is one component of a comprehensive surveillance program. It is not intended to diagnose infection nor to guide or monitor treatment. Performed at [facility], [place] [address] [place], [place] [address]      Radiology Studies: Dg Elbow Complete Right  Result Date: 05/03/2019 CLINICAL DATA:  Right elbow pain after fall. EXAM: RIGHT ELBOW - COMPLETE 3+ VIEW COMPARISON:  None. FINDINGS: Minimally displaced radial head fracture is noted. Abnormal anterior and posterior fat pad displacement is noted suggesting underlying joint effusion. IMPRESSION: Minimally displaced radial head fracture. Electronically Signed   By: Marijo Conception M.D.   On: 05/03/2019 12:15   Dg Foot Complete Right  Result Date: 05/03/2019 CLINICAL DATA:  Right foot pain after injury at work. EXAM: RIGHT FOOT COMPLETE - 3+ VIEW COMPARISON:  None. FINDINGS: No fracture or dislocation is noted. Mild degenerative changes seen involving first metatarsophalangeal joint. No soft tissue abnormality is noted. IMPRESSION: Mild osteoarthritis of the first metatarsophalangeal joint. No acute abnormality is noted. Electronically Signed   By: Marijo Conception M.D.   On: 05/03/2019 12:13   Dg C-arm 1-60 Min  Result Date: 05/04/2019 CLINICAL DATA:  Right hip arthroplasty EXAM: OPERATIVE RIGHT HIP (WITH PELVIS IF PERFORMED) 4 VIEWS TECHNIQUE: Fluoroscopic spot image(s) were submitted for interpretation post-operatively. COMPARISON:  None. FINDINGS: Intraoperative fluoroscopic images are provided. Interval right hip arthroplasty. IMPRESSION: Intraoperative localization. Electronically Signed   By: Kathreen Devoid   On: 05/04/2019 13:55   Dg Hip Operative Unilat W Or W/o Pelvis Right  Result Date: 05/04/2019 CLINICAL DATA:  Right hip arthroplasty EXAM: OPERATIVE RIGHT HIP (WITH PELVIS IF PERFORMED) 4 VIEWS TECHNIQUE: Fluoroscopic spot image(s) were submitted for  interpretation post-operatively. COMPARISON:  None. FINDINGS: Intraoperative fluoroscopic images are provided. Interval right hip arthroplasty. IMPRESSION: Intraoperative localization. Electronically Signed   By: Kathreen Devoid   On: 05/04/2019 13:55   Dg Hip Unilat With Pelvis 2-3 Views Right  Result Date: 05/03/2019 CLINICAL DATA:  Hip pain.  Mechanical fall EXAM: DG HIP (WITH OR WITHOUT PELVIS) 2-3V RIGHT COMPARISON:  None. FINDINGS: Fracture through the subcapital RIGHT femoral neck. Minimal angulation. Mild vertical migration. No dislocation. IMPRESSION: RIGHT subcapital femoral neck fracture. Electronically Signed   By: Suzy Bouchard M.D.   On: 05/03/2019 12:10    Scheduled Meds: . aspirin EC  81 mg Oral Daily  . docusate sodium  100 mg Oral BID  . feeding supplement (ENSURE ENLIVE)  237 mL Oral Q1500  . feeding supplement (PRO-STAT SUGAR FREE 64)  30 mL Oral  Daily  . fluticasone  2 spray Each Nare Daily  . insulin aspart  0-15 Units Subcutaneous TID WC  . insulin aspart  0-5 Units Subcutaneous QHS  . naproxen  250 mg Oral BID WC   Continuous Infusions: . methocarbamol (ROBAXIN) IV    . methocarbamol (ROBAXIN) IV       LOS: 2 days   Time spent: 35 min   Little Ishikawa, DO [facility] Pager [phone]  If 7PM-7AM, please contact night-coverage [email or web address] Password [email or web address] 05/05/2019, 7:25 AM

## 2019-05-05 NOTE — Progress Notes (Addendum)
  Subjective: Claudia [name] is a 63 y.o. female s/p Right THA.  They are POD1.  Pt's pain is controlled and improved since prior to surgery.  Pt has not ambulated yet.    Objective: Vital signs in last 24 hours: Temp:  [97.1 F (36.2 C)-98.7 F (37.1 C)] 98.4 F (36.9 C) (09/23 0450) Pulse Rate:  [73-85] 81 (09/23 0450) Resp:  [12-35] 18 (09/23 0450) BP: (85-114)/(51-63) 104/54 (09/23 0450) SpO2:  [81 %-100 %] 92 % (09/23 0450) Weight:  [107.5 kg] 107.5 kg (09/22 0906)  Intake/Output from previous day: 09/22 0701 - 09/23 0700 In: 1100 [I.V.:1100] Out: 1350 [Urine:1050; Blood:300] Intake/Output this shift: No intake/output data recorded.  Exam:  No gross blood or drainage overlying the wound vac in place 2+ DP pulse Sensation intact distally in the right foot Able to dorsiflex and plantarflex the right foot   Labs: Recent Labs    05/03/19 1601 05/04/19 0348 05/05/19 0315  HGB 13.2 13.1 11.2*   Recent Labs    05/04/19 0348 05/05/19 0315  WBC 10.5 11.7*  RBC 4.38 3.68*  HCT 40.2 33.7*  PLT 245 218   Recent Labs    05/04/19 0348 05/05/19 0315  NA 138 138  K 3.3* 3.8  CL 107 108  CO2 23 23  BUN 8 12  CREATININE 0.82 0.96  GLUCOSE 136* 114*  CALCIUM 8.4* 8.2*   No results for input(s): LABPT, INR in the last 72 hours.  Assessment/Plan: Pt is POD1 s/p Right THA.    -Prevena wound vac for 5 days to help incision heal.    -WBAT RLE with a platform walker  -NWB to RUE.  Continue sling use  -Continue PT evaluation  -Disposition pending hospitalist team clearance and success in PT  -Wants to avoid SNF due to fear of covid.  Has help at home and only one step to enter her one-level house  -F/u with Dr. Marlou Sa in clinic 2 weeks following surgery     Gerrianne Scale Mirenda Baltazar 05/05/2019, 7:25 AM

## 2019-05-06 LAB — BASIC METABOLIC PANEL
Anion gap: 11 (ref 5–15)
BUN: 12 mg/dL (ref 8–23)
CO2: 23 mmol/L (ref 22–32)
Calcium: 8.3 mg/dL — ABNORMAL LOW (ref 8.9–10.3)
Chloride: 104 mmol/L (ref 98–111)
Creatinine, Ser: 0.95 mg/dL (ref 0.44–1.00)
GFR calc Af Amer: >60 mL/min (ref >60)
GFR calc non Af Amer: >60 mL/min (ref >60)
Glucose, Bld: 135 mg/dL — ABNORMAL HIGH (ref 70–99)
Potassium: 3.8 mmol/L (ref 3.5–5.1)
Sodium: 138 mmol/L (ref 135–145)

## 2019-05-06 LAB — CBC
HCT: 34.6 % — ABNORMAL LOW (ref 36.0–46.0)
Hemoglobin: 11.2 g/dL — ABNORMAL LOW (ref 12.0–15.0)
MCH: 30.3 pg (ref 26.0–34.0)
MCHC: 32.4 g/dL (ref 30.0–36.0)
MCV: 93.5 fL (ref 80.0–100.0)
Platelets: 197 10*3/uL (ref 150–400)
RBC: 3.7 MIL/uL — ABNORMAL LOW (ref 3.87–5.11)
RDW: 14.5 % (ref 11.5–15.5)
WBC: 11.7 10*3/uL — ABNORMAL HIGH (ref 4.0–10.5)
nRBC: 0 % (ref 0.0–0.2)

## 2019-05-06 LAB — MAGNESIUM: Magnesium: 2 mg/dL (ref 1.7–2.4)

## 2019-05-06 LAB — GLUCOSE, CAPILLARY
Glucose-Capillary: 128 mg/dL — ABNORMAL HIGH (ref 70–99)
Glucose-Capillary: 214 mg/dL — ABNORMAL HIGH (ref 70–99)
Glucose-Capillary: 190 mg/dL — ABNORMAL HIGH (ref 70–99)
Glucose-Capillary: 182 mg/dL — ABNORMAL HIGH (ref 70–99)

## 2019-05-06 MED ORDER — ASPIRIN EC 81 MG PO TBEC
81.0000 mg | DELAYED_RELEASE_TABLET | Freq: Two times a day (BID) | ORAL | Status: DC
  Administered 2019-05-06 – 2019-05-07 (×2): 81 mg via ORAL
  Filled 2019-05-06 (×2): qty 1

## 2019-05-06 NOTE — Plan of Care (Signed)
  Problem: Coping: Goal: Level of anxiety will decrease Outcome: Progressing   Problem: Pain Managment: Goal: General experience of comfort will improve Outcome: Progressing   

## 2019-05-06 NOTE — Progress Notes (Addendum)
  Subjective: Claudia [name] is a 63 y.o. female s/p right THA.  They are POD2.  Pt's pain is controlled.  Pt has ambulated with great difficulty.     Objective: Vital signs in last 24 hours: Temp:  [98.2 F (36.8 C)-99.4 F (37.4 C)] 98.5 F (36.9 C) (09/24 0808) Pulse Rate:  [80-87] 87 (09/24 0808) Resp:  [16-19] 18 (09/24 0808) BP: (108-130)/(55-58) 128/58 (09/24 0808) SpO2:  [91 %-100 %] 96 % (09/24 0808)  Intake/Output from previous day: 09/23 0701 - 09/24 0700 In: 57 [P.O.:580] Out: 3300 [Urine:3300] Intake/Output this shift: Total I/O In: 0  Out: 500 [Urine:500]  Exam:  No gross blood or drainage overlying the dressing 2+ DP pulse Sensation intact distally in the right foot Able to dorsiflex and plantarflex the right foot   Labs: Recent Labs    05/03/19 1601 05/04/19 0348 05/05/19 0315 05/06/19 0227  HGB 13.2 13.1 11.2* 11.2*   Recent Labs    05/05/19 0315 05/06/19 0227  WBC 11.7* 11.7*  RBC 3.68* 3.70*  HCT 33.7* 34.6*  PLT 218 197   Recent Labs    05/05/19 0315 05/06/19 0227  NA 138 138  K 3.8 3.8  CL 108 104  CO2 23 23  BUN 12 12  CREATININE 0.96 0.95  GLUCOSE 114* 135*  CALCIUM 8.2* 8.3*   No results for input(s): LABPT, INR in the last 72 hours.  Assessment/Plan: Pt is POD2 s/p right THA.    -Disposition pending patient's pain and PT progress  -WBAT RLE with a platform walker  -NWB RUE.  Okay to use platform walker with RUE  -ASA 81mg  BID for DVT prophylaxis  -F/u in clinic with Dr. Marlou Sa 2 weeks postop     Gerrianne Scale Yared Barefoot 05/06/2019, 1:28 PM

## 2019-05-06 NOTE — Progress Notes (Signed)
Rehab Admissions Coordinator Note:  Patient was screened by Michel Santee for appropriateness for an Inpatient Acute Rehab Consult.  At this time, we are recommending Inpatient Rehab consult.  Note pt is already high level with PT and they are recommending home health with 24/7 assist, which pt appears to have.  Will f/u with consult to determine best rehab venue for pt.   Michel Santee 05/06/2019, 11:14 AM  I can be reached at [phone].

## 2019-05-06 NOTE — Progress Notes (Signed)
Patient stable She has been having some pain in the right hip.  Wound VAC which is an incisional VAC is in place Leg lengths equal and ankle dorsiflexion intact on the right and left hand side Plan is inpatient rehab tomorrow.  Continue the incisional VAC until postop day #5 and then we will change her to an Aquacel dressing.

## 2019-05-06 NOTE — Progress Notes (Signed)
PROGRESS NOTE    Claudia [name]  [id number] DOB: 11/16/1955 DOA: 05/03/2019 PCP: Tammi Sou, MD   Brief Narrative:  63 y.o. WF PMHx HTN; HLD, chronic pain from OA, diabetes type 2 uncontrolled with complication. Presenting with a fall at work.   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  She landed on her right hip and caught herself with her elbow.  She felt fine before this happened.  Subjective: The patient was seen and examined this morning, stable no acute distress denies any chest pain or shortness of breath.  No issues overnight.    Assessment & Plan:   Principal Problem:   Hip fracture ([place]) Active Problems:   DM (diabetes mellitus), secondary uncontrolled ([place])   Obesity with body mass index of 30.0-39.9   Essential hypertension   RIGHT hip fracture 2/2 mechanical fall, POA -Remained stable. - S/P R total hip arthroplasty 05/05/2019 - Per ortho:             -Prevena wound vac for 5 days              -WBAT RLE with a platform walker             -NWB to RUE. Continue sling use             -F/u with Dr. Marlou Sa in clinic 2 weeks following surgery  Essential HTN - BP remains well controlled off medications - Continue to hold Lisinopril  Non insulin dependent diabetes type 2 uncontrolled without complication -Stable, improve p.o. intake, -6/2 hemoglobin A1c= 7.9 - Continue moderate SSI, CBG QA CHS - Consult to diabetic coordinator - Consult to nutrition; uncontrolled diabetic requires some teaching  Chronic pain syndrome - Per patient currently well controlled -Titrating to p.o. meds  DVT prophylaxis: Per orthopedic Sx Code Status: Full Family communication: Family not present Disposition Plan: - PT/OT eval and treatment   -Recommending inpatient rehab.  Inpatient rehab has been consulted.  Patient will be discharged/transferred to inpatient rehab once approved. Patient/family agree to plan of care and discharge  planning.  Consultants: Orthopedic surgery  Procedures: 9/22 RIGHT hip total hip arthroplasty anterior approach.      Objective: Vitals:   05/05/19 1537 05/05/19 1952 05/06/19 0402 05/06/19 0808  BP: (!) 130/55 (!) 108/57 (!) 114/55 (!) 128/58  Pulse: 80 87 80 87  Resp: 19 16 18 18   Temp: 99.4 F (37.4 C) 99 F (37.2 C) 98.2 F (36.8 C) 98.5 F (36.9 C)  TempSrc: Oral Oral Oral Oral  SpO2: 100% 95% 91% 96%  Weight:      Height:        Intake/Output Summary (Last 24 hours) at 05/06/2019 1232 Last data filed at 05/06/2019 0830 Gross per 24 hour  Intake 340 ml  Output 2800 ml  Net -2460 ml   Filed Weights   05/03/19 1108 05/04/19 0906  Weight: 107.5 kg 107.5 kg    Examination: BP (!) 128/58 (BP Location: Left Arm)   Pulse 87   Temp 98.5 F (36.9 C) (Oral)   Resp 18   Ht 5' 7.5" (1.715 m)   Wt 107.5 kg   SpO2 96%   BMI 36.57 kg/m    Physical Exam  Constitution:  Alert, cooperative, no distress,  Psychiatric: Normal and stable mood and affect, cognition intact,   HEENT: Normocephalic, PERRL, otherwise with in Normal limits  Chest:Chest symmetric Cardio vascular:  S1/S2, RRR, No murmure,  No Rubs or Gallops  pulmonary: Clear to auscultation bilaterally, respirations unlabored, negative wheezes / crackles Abdomen: Soft, non-tender, non-distended, bowel sounds,no masses, no organomegaly Muscular skeletal: Limited exam - in bed, able to move all 4 extremities, Normal strength,  Neuro: CNII-XII intact. , normal motor and sensation, reflexes intact  Extremities:  RIGHT thigh with wound VAC in place minimal to no return.  Mild  pitting edema lower extremities, +2 pulses  Skin: Dry, warm to touch, negative for any Rashes, No open wounds Wounds: per nursing documentation     Data Reviewed  CBC: Recent Labs  Lab 05/03/19 1601 05/04/19 0348 05/05/19 0315 05/06/19 0227  WBC 13.2* 10.5 11.7* 11.7*  NEUTROABS 11.5*  --   --   --   HGB 13.2 13.1 11.2* 11.2*   HCT 39.6 40.2 33.7* 34.6*  MCV 93.6 91.8 91.6 93.5  PLT 188 245 218 [id number]   Basic Metabolic Panel: Recent Labs  Lab 05/03/19 1601 05/04/19 0348 05/05/19 0315 05/06/19 0227  NA 139 138 138 138  K 3.6 3.3* 3.8 3.8  CL 108 107 108 104  CO2 21* 23 23 23   GLUCOSE 131* 136* 114* 135*  BUN 10 8 12 12   CREATININE 0.72 0.82 0.96 0.95  CALCIUM 8.5* 8.4* 8.2* 8.3*  MG  --   --  2.1 2.0  PHOS  --   --  2.9  --    GFR: Estimated Creatinine Clearance: 77.2 mL/min (by C-G formula based on SCr of 0.95 mg/dL).  CBG: Recent Labs  Lab 05/05/19 1138 05/05/19 1626 05/05/19 2052 05/06/19 0637 05/06/19 1052  GLUCAP 162* 194* 151* 128* 214*    Recent Results (from the past 240 hour(s))  SARS Coronavirus 2 [facility] order, Performed in [facility] hospital lab) Nasopharyngeal Nasopharyngeal Swab     Status: None   Collection Time: 05/03/19 12:19 PM   Specimen: Nasopharyngeal Swab  Result Value Ref Range Status   SARS Coronavirus 2 NEGATIVE NEGATIVE Final    Comment: (NOTE) If result is NEGATIVE SARS-CoV-2 target nucleic acids are NOT DETECTED. The SARS-CoV-2 RNA is generally detectable in upper and lower  respiratory specimens during the acute phase of infection. The lowest  concentration of SARS-CoV-2 viral copies this assay can detect is 250  copies / mL. A negative result does not preclude SARS-CoV-2 infection  and should not be used as the sole basis for treatment or other  patient management decisions.  A negative result may occur with  improper specimen collection / handling, submission of specimen other  than nasopharyngeal swab, presence of viral mutation(s) within the  areas targeted by this assay, and inadequate number of viral copies  (<250 copies / mL). A negative result must be combined with clinical  observations, patient history, and epidemiological information. If result is POSITIVE SARS-CoV-2 target nucleic acids are DETECTED. The SARS-CoV-2 RNA is generally  detectable in upper and lower  respiratory specimens dur ing the acute phase of infection.  Positive  results are indicative of active infection with SARS-CoV-2.  Clinical  correlation with patient history and other diagnostic information is  necessary to determine patient infection status.  Positive results do  not rule out bacterial infection or co-infection with other viruses. If result is PRESUMPTIVE POSTIVE SARS-CoV-2 nucleic acids MAY BE PRESENT.   A presumptive positive result was obtained on the submitted specimen  and confirmed on repeat testing.  While 2019 novel coronavirus  (SARS-CoV-2) nucleic acids may be present in the submitted sample  additional confirmatory  testing may be necessary for epidemiological  and / or clinical management purposes  to differentiate between  SARS-CoV-2 and other Sarbecovirus currently known to infect humans.  If clinically indicated additional testing with an alternate test  methodology [phone]) is advised. The SARS-CoV-2 RNA is generally  detectable in upper and lower respiratory sp ecimens during the acute  phase of infection. The expected result is Negative. Fact Sheet for Patients:  StrictlyIdeas.no Fact Sheet for Healthcare Providers: BankingDealers.co.za This test is not yet approved or cleared by the [place] FDA and has been authorized for detection and/or diagnosis of SARS-CoV-2 by FDA under an Emergency Use Authorization (EUA).  This EUA will remain in effect (meaning this test can be used) for the duration of the COVID-19 declaration under Section 564(b)(1) of the Act, 21 U.S.C. section 360bbb-3(b)(1), unless the authorization is terminated or revoked sooner. Performed at [facility], [place] [address], [place], [place] [address]   Surgical pcr screen     Status: Abnormal   Collection Time: 05/03/19  8:25 PM   Specimen: Nasal Mucosa; Nasal Swab  Result Value Ref Range  Status   MRSA, PCR NEGATIVE NEGATIVE Final   Staphylococcus aureus POSITIVE (A) NEGATIVE Final    Comment: (NOTE) The Xpert SA Assay (FDA approved for NASAL specimens in patients 57 years of age and older), is one component of a comprehensive surveillance program. It is not intended to diagnose infection nor to guide or monitor treatment. Performed at [facility], [place] [address], [place], [place] [address]      Radiology Studies: Dg C-arm 1-60 Min  Result Date: 05/04/2019 CLINICAL DATA:  Right hip arthroplasty EXAM: OPERATIVE RIGHT HIP (WITH PELVIS IF PERFORMED) 4 VIEWS TECHNIQUE: Fluoroscopic spot image(s) were submitted for interpretation post-operatively. COMPARISON:  None. FINDINGS: Intraoperative fluoroscopic images are provided. Interval right hip arthroplasty. IMPRESSION: Intraoperative localization. Electronically Signed   By: Kathreen Devoid   On: 05/04/2019 13:55   Dg Hip Operative Unilat W Or W/o Pelvis Right  Result Date: 05/04/2019 CLINICAL DATA:  Right hip arthroplasty EXAM: OPERATIVE RIGHT HIP (WITH PELVIS IF PERFORMED) 4 VIEWS TECHNIQUE: Fluoroscopic spot image(s) were submitted for interpretation post-operatively. COMPARISON:  None. FINDINGS: Intraoperative fluoroscopic images are provided. Interval right hip arthroplasty. IMPRESSION: Intraoperative localization. Electronically Signed   By: Kathreen Devoid   On: 05/04/2019 13:55    Scheduled Meds: . aspirin EC  81 mg Oral Daily  . Chlorhexidine Gluconate Cloth  6 each Topical Daily  . docusate sodium  100 mg Oral BID  . feeding supplement (ENSURE ENLIVE)  237 mL Oral Q1500  . feeding supplement (PRO-STAT SUGAR FREE 64)  30 mL Oral Daily  . fluticasone  2 spray Each Nare Daily  . insulin aspart  0-15 Units Subcutaneous TID WC  . insulin aspart  0-5 Units Subcutaneous QHS  . mupirocin ointment  1 application Nasal BID  . naproxen  250 mg Oral BID WC   Continuous Infusions: . methocarbamol (ROBAXIN) IV    .  methocarbamol (ROBAXIN) IV       LOS: 3 days   Time spent: 80 min   Deatra James, MD  [facility] Pager [phone]  If 7PM-7AM, please contact night-coverage [email or web address] Password [facility] 05/06/2019, 12:32 PM

## 2019-05-06 NOTE — PMR Pre-admission (Signed)
PMR Admission Coordinator Pre-Admission Assessment  Patient: Claudia [name] is an 63 y.o., female MRN: [id number] DOB: 02/27/1956 Height: 5' 7.5" (171.5 cm) Weight: 107.5 kg  Insurance Information HMO:     PPO:      PCP:      IPA:      80/20:      OTHER:  PRIMARY: Generic Worker's Comp/[place] CMS      Policy#: (claim #:  [id number] )    Subscriber: Patient CM Name: Abigail Butts      Phone#: [phone]     Fax#: [phone] Pre-Cert#: Claim number: [id number]       Approval provided by Abigail Butts on 9/25 for admit to CIR. Pt is approved for 7 day stay in acute rehab facility. Claim Examiner: Olga Coaster (per Chad Cordial may be off next week so if there are any questions you can contact Abigail Butts) Date of Injury: 05/03/2019   Employer: [employer] Benefits:  Phone #: NA     Name: NA, per worker's comp Eff. Date: (Date of Injury 05/03/2019)     Deduct:       Out of Pocket Max:       Life Max:  CIR: Covered per [employer] Comp      SNF:  Outpatient:      Co-Pay:  Home Health:       Co-Pay:  DME:      Co-Pay:  Providers:  SECONDARY: None     Policy#:       Subscriber:  CM Name:       Phone#:      Fax#:  Pre-Cert#:       Employer: Benefits:  Phone #:      Name:  Eff. Date:      Deduct:       Out of Pocket Max:       Life Max:  CIR:       SNF:  Outpatient:      Co-Pay: Home Health:       Co-Pay:  DME:      Co-Pay:   Medicaid Application Date:       Case Manager: Disability Application Date:       Case Worker:   The "Data Collection Information Summary" for patients in Inpatient Rehabilitation Facilities with attached "Privacy Act [place] Records" was provided and verbally reviewed with: N/A  Emergency Contact Information Contact Information    Name Relation Home Work [place] Daughter   [phone]   Dalisa, Forrer [phone]        Current Medical History  Patient Admitting Diagnosis: multi-ortho trauma  History of Present Illness: Claudia [name] is a 63 year old female with history of hypertension, diabetes mellitus chronic bilateral knee pain and low back pain maintained on hydrocodone 1 to 2 tablets at bedtime as needed as well as Ultram 1 tablet every 6 hours as needed.  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  Denied loss of consciousness.  X-rays and imaging revealed right subcapital femoral neck fracture as well as minimally displaced right radial head fracture.  Underwent right total hip arthroplasty anterior approach 05/04/2019 per Dr. Marlou Sa.Prevana wound VAC placed x5 days to help incision healed and then change to Aquacel dressing.  Weightbearing as tolerated right lower extremity.  Conservative care of right radial head fracture nonweightbearing using platform walker.  Hospital course pain management.  PCR screening positive completing course of  Bactroban.  Placed on aspirin 81 mg twice daily for DVT prophylaxis.  Therapy evaluation completed and patient is to be admitted for a comprehensive rehab program on 05/07/2019.    Patient's medical record from [facility] has been reviewed by the rehabilitation admission coordinator and physician.  Past Medical History  Past Medical History:  Diagnosis Date  . Chronic pain syndrome    chronic bilat knee pain and LBP secondary to osteoarthritis.  . Complication of anesthesia   . Diabetes mellitus without complication ([place])   . Hypertension   . Osteoarthritis of multiple joints    Left TKR, chronic R knee pain  . PONV (postoperative nausea and vomiting)   . SCC (squamous cell carcinoma), scalp/neck    scalp (removed 11/2015)    Family History   family history includes Cancer in her mother; Diabetes in her mother.  Prior Rehab/Hospitalizations Has the patient had prior rehab or hospitalizations prior to admission? No  Has the patient had major surgery during 100 days prior to admission?  Yes   Current Medications  Current Facility-Administered Medications:  .  acetaminophen (TYLENOL) tablet 325-650 mg, 325-650 mg, Oral, Q6H PRN, Magnant, Charles L, PA-C, 650 mg at 05/05/19 0651 .  aspirin EC tablet 81 mg, 81 mg, Oral, BID, Magnant, Charles L, PA-C, 81 mg at 05/07/19 0850 .  bisacodyl (DULCOLAX) EC tablet 5 mg, 5 mg, Oral, Daily PRN, Karmen Bongo, MD .  Chlorhexidine Gluconate Cloth 2 % PADS 6 each, 6 each, Topical, Daily, Marlou Sa, Tonna Corner, MD, 6 each at 05/07/19 1311 .  docusate sodium (COLACE) capsule 100 mg, 100 mg, Oral, BID, Magnant, Charles L, PA-C, 100 mg at 05/07/19 0850 .  feeding supplement (ENSURE ENLIVE) (ENSURE ENLIVE) liquid 237 mL, 237 mL, Oral, Q1500, Allie Bossier, MD, 237 mL at 05/06/19 1431 .  feeding supplement (PRO-STAT SUGAR FREE 64) liquid 30 mL, 30 mL, Oral, Daily, Allie Bossier, MD, 30 mL at 05/07/19 0850 .  fluticasone (FLONASE) 50 MCG/ACT nasal spray 2 spray, 2 spray, Each Nare, Daily, Karmen Bongo, MD, 2 spray at 05/07/19 [phone] .  HYDROcodone-acetaminophen (NORCO/VICODIN) 5-325 MG per tablet 1-2 tablet, 1-2 tablet, Oral, Q6H PRN, Karmen Bongo, MD, 2 tablet at 05/07/19 0856 .  HYDROmorphone (DILAUDID) injection 0.5-1 mg, 0.5-1 mg, Intravenous, Q4H PRN, Magnant, Charles L, PA-C, 1 mg at 05/07/19 0227 .  insulin aspart (novoLOG) injection 0-15 Units, 0-15 Units, Subcutaneous, TID WC, Karmen Bongo, MD, 8 Units at 05/07/19 1300 .  insulin aspart (novoLOG) injection 0-5 Units, 0-5 Units, Subcutaneous, QHS, Karmen Bongo, MD, 2 Units at 05/04/19 2256 .  methocarbamol (ROBAXIN) tablet 500 mg, 500 mg, Oral, Q6H PRN, 500 mg at 05/03/19 1639 **OR** methocarbamol (ROBAXIN) 500 mg in dextrose 5 % 50 mL IVPB, 500 mg, Intravenous, Q6H PRN, Karmen Bongo, MD .  methocarbamol (ROBAXIN) tablet 500 mg, 500 mg, Oral, Q6H PRN, 500 mg at 05/07/19 0857 **OR** methocarbamol (ROBAXIN) 500 mg in dextrose 5 % 50 mL IVPB, 500 mg, Intravenous, Q6H PRN, Magnant,  Charles L, PA-C .  metoCLOPramide (REGLAN) tablet 5-10 mg, 5-10 mg, Oral, Q8H PRN **OR** metoCLOPramide (REGLAN) injection 5-10 mg, 5-10 mg, Intravenous, Q8H PRN, Magnant, Charles L, PA-C .  morphine 2 MG/ML injection 0.5 mg, 0.5 mg, Intravenous, Q2H PRN, Karmen Bongo, MD, 0.5 mg at 05/04/19 0830 .  mupirocin ointment (BACTROBAN) 2 % 1 application, 1 application, Nasal, BID, Marlou Sa, Tonna Corner, MD, 1 application at 95/09/32 [phone] .  naproxen (NAPROSYN) tablet 250 mg, 250  mg, Oral, BID WC, Magnant, Charles L, PA-C, 250 mg at 05/07/19 0851 .  ondansetron (ZOFRAN) tablet 4 mg, 4 mg, Oral, Q6H PRN **OR** ondansetron (ZOFRAN) injection 4 mg, 4 mg, Intravenous, Q6H PRN, Magnant, Charles L, PA-C .  oxyCODONE (Oxy IR/ROXICODONE) immediate release tablet 5-10 mg, 5-10 mg, Oral, Q4H PRN, Magnant, Charles L, PA-C, 10 mg at 05/06/19 2031 .  polyethylene glycol (MIRALAX / GLYCOLAX) packet 17 g, 17 g, Oral, Daily PRN, Karmen Bongo, MD  Patients Current Diet:  Diet Order            Diet - low sodium heart healthy        Diet Carb Modified        Diet Carb Modified Fluid consistency: Thin; Room service appropriate? Yes  Diet effective now              Precautions / Restrictions Precautions Precautions: Fall Restrictions Weight Bearing Restrictions: Yes RUE Weight Bearing: Non weight bearing RLE Weight Bearing: Weight bearing as tolerated   Has the patient had 2 or more falls or a fall with injury in the past year? Yes  Prior Activity Level Community (5-7x/wk): very active PTA; worked Part TIme at [employer], [place] [employer]; drove PTA  Prior Functional Level Self Care: Did the patient need help bathing, dressing, using the toilet or eating? Independent  Indoor Mobility: Did the patient need assistance with walking from room to room (with or without device)? Independent  Stairs: Did the patient need assistance with internal or external stairs (with or without device)?  Independent  Functional Cognition: Did the patient need help planning regular tasks such as shopping or remembering to take medications? Independent  Home Assistive Devices / Equipment Home Assistive Devices/Equipment: None Home Equipment: Walker - 2 wheels, Shower seat  Prior Device Use: Indicate devices/aids used by the patient prior to current illness, exacerbation or injury? None of the above  Current Functional Level Cognition  Overall Cognitive Status: Within Functional Limits for tasks assessed Orientation Level: Oriented X4    Extremity Assessment (includes Sensation/Coordination)  Upper Extremity Assessment: RUE deficits/detail RUE Deficits / Details: R radius fx with mild edema, full AROM RUE Sensation: WNL RUE Coordination: WNL  Lower Extremity Assessment: Defer to PT evaluation RLE Deficits / Details: ROM WFL, Strength grossly 3+/5 RLE Sensation: WNL    ADLs  Overall ADL's : Needs assistance/impaired Eating/Feeding: Independent, Sitting Grooming: Brushing hair, Sitting, Set up Grooming Details (indicate cue type and reason): with L hand Upper Body Bathing: Moderate assistance, Sitting Lower Body Bathing: Maximal assistance, Sit to/from stand Upper Body Dressing : Moderate assistance, Sitting Lower Body Dressing: Maximal assistance, Sit to/from stand Toilet Transfer: Minimal assistance, Stand-pivot, BSC Toileting- Clothing Manipulation and Hygiene: Minimal assistance, Sit to/from stand    Mobility  Overal bed mobility: Needs Assistance Bed Mobility: Supine to Sit Supine to sit: Min guard General bed mobility comments: pt received in chair    Transfers  Overall transfer level: Needs assistance Equipment used: Hemi-walker Transfers: Sit to/from Stand Sit to Stand: Min assist Stand pivot transfers: Min assist General transfer comment: assist to rise and steady    Ambulation / Gait / Stairs / Emergency planning/management officer  Ambulation/Gait Ambulation/Gait assistance:  Counsellor (Feet): 3 Feet(2nd trial of 5 feet) Assistive device: Hemi-walker Gait Pattern/deviations: Step-to pattern, Decreased step length - right, Decreased step length - left, Decreased stance time - right, Decreased stance time - left, Decreased weight shift to right Gait velocity: decreased significantly Gait velocity  interpretation: <1.31 ft/sec, indicative of household ambulator    Posture / Balance Balance Overall balance assessment: Needs assistance Sitting-balance support: Single extremity supported, Feet supported Sitting balance-Leahy Scale: Fair Standing balance support: Single extremity supported Standing balance-Leahy Scale: Fair Standing balance comment: requires one hand stabilization    Special needs/care consideration BiPAP/CPAP : no CPM : no Continuous Drip IV : no Dialysis : no        Days : no Life Vest : no Oxygen : no Special Bed : no Trach Size : no Wound Vac (area) : yes      Location : RLE (hip region) Skin : surgical incision to right hip; negative pressure wound therapy to right hip; ecchymosis to posterior toe                     Bowel mgmt: last BM: 05/03/2019 Bladder mgmt: incontinence, external catheter  Diabetic mgmt: yes Behavioral consideration : no Chemo/radiation : no   Previous Home Environment (from acute therapy documentation) Living Arrangements: Alone Available Help at Discharge: Family, Available 24 hours/day Type of Home: House Home Layout: One level Home Access: Stairs to enter Entrance Stairs-Rails: None Entrance Stairs-Number of Steps: 1 Bathroom Shower/Tub: Optometrist: Yes Home Care Services: No  Discharge Living Setting Plans for Discharge Living Setting: Patient's home, House, Other (Comment)(lives alone but will have 24/7 A from son,daughter, & sister) Type of Home at Discharge: House Discharge Home Layout: One level Discharge Home Access: Stairs to  enter Entrance Stairs-Rails: None Entrance Stairs-Number of Steps: 1 Discharge Bathroom Shower/Tub: Tub/shower unit Discharge Bathroom Toilet: Handicapped height Discharge Bathroom Accessibility: Yes How Accessible: Accessible via walker Does the patient have any problems obtaining your medications?: No  Social/Family/Support Systems Patient Roles: Other (Comment)(works part time; has grown children and sister ) Contact Information: Fransisco Beau (son): [phone]; Karlene Einstein (daughter): [phone] Anticipated Caregiver: son, daugther, and sister (plan is for rotating schedule (someone will take time off work on rotating schedule). Fransisco Beau to take time off first per son report.  Anticipated Caregiver's Contact Information: see above Ability/Limitations of Caregiver: Min A  Caregiver Availability: 24/7 Discharge Plan Discussed with Primary Caregiver: Yes(with pt and son) Is Caregiver In Agreement with Plan?: Yes Does Caregiver/Family have Issues with Lodging/Transportation while Pt is in Rehab?: No  Goals/Additional Needs Patient/Family Goal for Rehab: PT/OT: Mod I; SLP: NA Expected length of stay: 4-6 days Cultural Considerations: NA Dietary Needs: carb modified; thin liquids; calorie level 1600-2000 Equipment Needs: TBD Special Service Needs: NA Pt/Family Agrees to Admission and willing to participate: Yes Program Orientation Provided & Reviewed with Pt/Caregiver Including Roles  & Responsibilities: Yes(pt and her son)  Barriers to Discharge: Home environment access/layout, Weight bearing restrictions  Barriers to Discharge Comments: one step to enter home.   Decrease burden of Care through IP rehab admission: NA  Possible need for SNF placement upon discharge: Not anticipated; pt has great family support with plan for someone to stay with her 24/7 at DC. Pt lives in 1 story home so there is very little environmental barriers.   Patient Condition: I have reviewed medical records from [facility], spoken with MD, and patient and son. I met with patient at the bedside for inpatient rehabilitation assessment.  Patient will benefit from ongoing PT and OT, can actively participate in 3 hours of therapy a day 5 days of the week, and can make measurable gains during the admission.  Patient will also benefit from  the coordinated team approach during an Inpatient Acute Rehabilitation admission.  The patient will receive intensive therapy as well as Rehabilitation physician, nursing, social worker, and care management interventions.  Due to safety, skin/wound care, disease management, medication administration, pain management and patient education the patient requires 24 hour a day rehabilitation nursing.  The patient is currently Min A for transfers and Min G 3 feet with hemi-walker with mobility and Mod to max A for basic ADLs.  Discharge setting and therapy post discharge at home with home health is anticipated.  Patient has agreed to participate in the Acute Inpatient Rehabilitation Program and will admit 05/07/2019.  Preadmission Screen Completed By:  Jhonnie Garner, 05/07/2019 2:02 PM ______________________________________________________________________   Discussed status with Dr. Naaman Plummer on 05/07/2019 at 1:24PM and received approval for admission today.  Admission Coordinator:  Jhonnie Garner, OT, time 1:24PM/Date 05/07/2019   Assessment/Plan: Diagnosis: right femoral neck, right radial head fx 1. Does the need for close, 24 hr/day Medical supervision in concert with the patient's rehab needs make it unreasonable for this patient to be served in a less intensive setting? Yes 2. Co-Morbidities requiring supervision/potential complications: dm, htn, chronic pain syndrome 3. Due to bladder management, bowel management, safety, skin/wound care, disease management, medication administration, pain management and patient education, does the patient require 24 hr/day rehab nursing?  Yes 4. Does the patient require coordinated care of a physician, rehab nurse, PT (1-2 hrs/day, 5 days/week) and OT (1-2 hrs/day, 5 days/week) to address physical and functional deficits in the context of the above medical diagnosis(es)? Yes Addressing deficits in the following areas: balance, endurance, locomotion, strength, transferring, bowel/bladder control, bathing, dressing, feeding, grooming, toileting and psychosocial support 5. Can the patient actively participate in an intensive therapy program of at least 3 hrs of therapy 5 days a week? Yes 6. The potential for patient to make measurable gains while on inpatient rehab is excellent 7. Anticipated functional outcomes upon discharge from inpatients are: modified independent PT, modified independent OT, n/a SLP 8. Estimated rehab length of stay to reach the above functional goals is: 4-7days 9. Anticipated D/C setting: Home 10. Anticipated post D/C treatments: [place] therapy 11. Overall Rehab/Functional Prognosis: excellent  MD Signature: Meredith Staggers, MD, [facility] 05/07/2019

## 2019-05-06 NOTE — Progress Notes (Signed)
Physical Therapy Treatment Patient Details Name: SHAIANNA FELMLEE MRN: [id number] DOB: 12/19/1955 Today's Date: 05/06/2019    History of Present Illness 63 y.o. female with medical history significant of HTN; DM; and chronic pain from OA presenting with a fall at work resulting in R radial head fx and R femoral neck fx. Pt now s/p R THA (direct anterior approach) and splingting of RUE.    PT Comments    Pt limited by pain during session. Pt demonstrates improved quality of bed mobility and sit to stand transfers, requiring reduced physical assistance although still needing significant verbal and tactile cueing to maintain NWB through RUE. Pt unable to clear feet for steps at this time. Pt will benefit from continued acute PT services to reduce assistance requirements during OOB activity, reduce falls risk, and reinforce WB precautions. Recommendations: Pt will benefit from high intensity inpatient PT services in a inpatient rehab facility.   Follow Up Recommendations  CIR     Equipment Recommendations  3in1 (PT);Wheelchair (measurements PT)(hemi-walker)    Recommendations for Other Services       Precautions / Restrictions Precautions Precautions: Fall Required Braces or Orthoses: Sling Restrictions Weight Bearing Restrictions: Yes RUE Weight Bearing: Non weight bearing RLE Weight Bearing: Weight bearing as tolerated    Mobility  Bed Mobility   Bed Mobility: Supine to Sit     Supine to sit: Min assist     General bed mobility comments: pt received in chair  Transfers Overall transfer level: Needs assistance Equipment used: 1 person hand held assist Transfers: Sit to/from [facility] Sit to Stand: Mod assist Stand pivot transfers: Min assist       General transfer comment: assist to rise and steady  Ambulation/Gait                 Stairs             Wheelchair Mobility    Modified Rankin (Stroke Patients Only)       Balance  Overall balance assessment: Needs assistance Sitting-balance support: Single extremity supported;Feet supported Sitting balance-Leahy Scale: Fair     Standing balance support: Single extremity supported Standing balance-Leahy Scale: Poor Standing balance comment: requires one hand stabilization                            Cognition Arousal/Alertness: Awake/alert Behavior During Therapy: WFL for tasks assessed/performed Overall Cognitive Status: Within Functional Limits for tasks assessed                                        Exercises      General Comments        Pertinent Vitals/Pain Pain Assessment: Faces Faces Pain Scale: Hurts even more Pain Location: R hip Pain Descriptors / Indicators: Sore Pain Intervention(s): Limited activity within patient's tolerance;Monitored during session;Premedicated before session;Repositioned;Ice applied    Home Living Family/patient expects to be discharged to:: Private residence Living Arrangements: Alone Available Help at Discharge: Family;Available 24 hours/day Type of Home: House Home Access: Stairs to enter Entrance Stairs-Rails: None Home Layout: One level        Prior Function Level of Independence: Independent      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   PT Goals (current goals can now be found in the care plan section) Acute Rehab PT Goals Patient Stated Goal: To return  to prior level of function Progress towards PT goals: Progressing toward goals    Frequency    Min 5X/week      PT Plan Discharge plan needs to be updated    Co-evaluation              AM-PAC PT "6 Clicks" Mobility   Outcome Measure  Help needed turning from your back to your side while in a flat bed without using bedrails?: A Little Help needed moving from lying on your back to sitting on the side of a flat bed without using bedrails?: A Little Help needed moving to and from a bed to a chair  (including a wheelchair)?: A Lot Help needed standing up from a chair using your arms (e.g., wheelchair or bedside chair)?: A Little Help needed to walk in hospital room?: A Lot Help needed climbing 3-5 steps with a railing? : A Lot 6 Click Score: 15    End of Session Equipment Utilized During Treatment: Gait belt Activity Tolerance: Patient limited by pain Patient left: in chair;with call bell/phone within reach;with chair alarm set Nurse Communication: Mobility status PT Visit Diagnosis: Other abnormalities of gait and mobility (R26.89)     Time: [id number] PT Time Calculation (min) (ACUTE ONLY): 24 min  Charges:  $Therapeutic Activity: 23-37 mins                     Zenaida Niece, PT, DPT Acute Rehabilitation Pager: [phone]    Zenaida Niece 05/06/2019, 11:30 AM

## 2019-05-06 NOTE — Progress Notes (Signed)
Inpatient Rehab Admissions:  Inpatient Rehab Consult received.  I met with pt at the bedside for rehabilitation assessment and to discuss goals and expectations of an inpatient rehab admission.  Pt is very interested in CIR program and would like to pursue. I have confirmed DC assist to support an IP Rehab admission. I will begin insurance authorization process for possible admit. Will follow up once determination has been made.   Please call if questions.   Jhonnie Garner, OTR/L  Rehab Admissions Coordinator  [phone] 05/06/2019 1:48 PM

## 2019-05-06 NOTE — Evaluation (Signed)
Occupational Therapy Evaluation Patient Details Name: Claudia [name] MRN: [id number] DOB: 03/18/1956 Today's Date: 05/06/2019    History of Present Illness 63 y.o. female with medical history significant of HTN; DM; and chronic pain from OA presenting with a fall at work resulting in R radial head fx and R femoral neck fx. Pt now s/p R THA (direct anterior approach) and splingting of RUE.   Clinical Impression   Pt is typically independent. Presents with R hip pain, generalized weakness and decreased standing balance. She requires up to max assist for ADL and min to mod assist for transfers. Pt likely to progress well once her pain is improved and with intensive rehab. Recommending CIR. Will follow acutely.    Follow Up Recommendations  CIR    Equipment Recommendations  3 in 1 bedside commode;Wheelchair (measurements OT);Wheelchair cushion (measurements OT);Tub/shower bench    Recommendations for Other Services       Precautions / Restrictions Precautions Precautions: Fall Required Braces or Orthoses: Sling Restrictions Weight Bearing Restrictions: Yes RUE Weight Bearing: Non weight bearing RLE Weight Bearing: Weight bearing as tolerated      Mobility Bed Mobility               General bed mobility comments: pt received in chair  Transfers Overall transfer level: Needs assistance Equipment used: 1 person hand held assist Transfers: Sit to/from Stand;Stand Pivot Transfers Sit to Stand: Mod assist Stand pivot transfers: Min assist       General transfer comment: assist to rise and steady    Balance Overall balance assessment: Needs assistance   Sitting balance-Leahy Scale: Fair       Standing balance-Leahy Scale: Poor Standing balance comment: requires one hand stabilization                           ADL either performed or assessed with clinical judgement   ADL Overall ADL's : Needs assistance/impaired Eating/Feeding: Independent;Sitting    Grooming: Brushing hair;Sitting;Set up Grooming Details (indicate cue type and reason): with L hand Upper Body Bathing: Moderate assistance;Sitting   Lower Body Bathing: Maximal assistance;Sit to/from stand   Upper Body Dressing : Moderate assistance;Sitting   Lower Body Dressing: Maximal assistance;Sit to/from stand   Toilet Transfer: Minimal assistance;Stand-pivot;BSC   Toileting- Clothing Manipulation and Hygiene: Minimal assistance;Sit to/from stand               Vision Baseline Vision/History: Wears glasses Wears Glasses: At all times Patient Visual Report: No change from baseline       Perception     Praxis      Pertinent Vitals/Pain Pain Assessment: Faces Faces Pain Scale: Hurts even more Pain Location: R hip Pain Descriptors / Indicators: Sore Pain Intervention(s): Limited activity within patient's tolerance;Monitored during session;Premedicated before session;Repositioned;Ice applied     Hand Dominance Right   Extremity/Trunk Assessment Upper Extremity Assessment Upper Extremity Assessment: RUE deficits/detail RUE Deficits / Details: R radius fx with mild edema, full AROM RUE Sensation: WNL RUE Coordination: WNL   Lower Extremity Assessment Lower Extremity Assessment: Defer to PT evaluation   Cervical / Trunk Assessment Cervical / Trunk Assessment: Normal   Communication Communication Communication: No difficulties   Cognition Arousal/Alertness: Awake/alert Behavior During Therapy: WFL for tasks assessed/performed Overall Cognitive Status: Within Functional Limits for tasks assessed  General Comments       Exercises     Shoulder Instructions      Home Living Family/patient expects to be discharged to:: Private residence Living Arrangements: Alone Available Help at Discharge: Family;Available 24 hours/day Type of Home: House Home Access: Stairs to enter [employer] of  Steps: 1 Entrance Stairs-Rails: None Home Layout: One level     Bathroom Shower/Tub: Teacher, early years/pre: Standard                Prior Functioning/Environment Level of Independence: Independent        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        OT Problem List: Decreased strength;Impaired balance (sitting and/or standing);Decreased knowledge of use of DME or AE;Pain;Impaired UE functional use      OT Treatment/Interventions: Self-care/ADL training;DME and/or AE instruction;Patient/family education;Balance training;Therapeutic activities    OT Goals(Current goals can be found in the care plan section) Acute Rehab OT Goals Patient Stated Goal: To return to prior level of function OT Goal Formulation: With patient Time For Goal Achievement: 05/20/19 Potential to Achieve Goals: Good ADL Goals Pt Will Perform Grooming: with supervision;standing Pt Will Perform Upper Body Bathing: with supervision;sitting;with adaptive equipment Pt Will Perform Lower Body Bathing: with supervision;with adaptive equipment;sit to/from stand Pt Will Perform Upper Body Dressing: with set-up;sitting Pt Will Perform Lower Body Dressing: with supervision;with adaptive equipment;sit to/from stand Pt Will Transfer to Toilet: with supervision;ambulating;bedside commode Pt Will Perform Toileting - Clothing Manipulation and hygiene: with supervision;sit to/from stand  OT Frequency: Min 2X/week   Barriers to D/C:            Co-evaluation              AM-PAC OT "6 Clicks" Daily Activity     Outcome Measure Help from another person eating meals?: None Help from another person taking care of personal grooming?: A Little Help from another person toileting, which includes using toliet, bedpan, or urinal?: A Lot Help from another person bathing (including washing, rinsing, drying)?: A Lot Help from another person to put on and taking off regular upper body clothing?: A  Lot Help from another person to put on and taking off regular lower body clothing?: A Lot 6 Click Score: 15   End of Session Equipment Utilized During Treatment: Gait belt  Activity Tolerance: Patient tolerated treatment well Patient left: in chair;with call bell/phone within reach  OT Visit Diagnosis: Unsteadiness on feet (R26.81);Other abnormalities of gait and mobility (R26.89);Pain;History of falling (Z91.81)                Time: [id number] OT Time Calculation (min): 18 min Charges:  OT General Charges $OT Visit: 1 Visit OT Evaluation $OT Eval Moderate Complexity: 1 Mod  Nestor Lewandowsky, OTR/L Acute Rehabilitation Services Pager: [phone] Office: [phone] Claudia [name] 05/06/2019, 10:44 AM

## 2019-05-06 NOTE — Plan of Care (Signed)

## 2019-05-07 ENCOUNTER — Encounter (HOSPITAL_COMMUNITY): Payer: Self-pay

## 2019-05-07 ENCOUNTER — Inpatient Hospital Stay (HOSPITAL_COMMUNITY): Payer: No Typology Code available for payment source

## 2019-05-07 ENCOUNTER — Inpatient Hospital Stay (HOSPITAL_COMMUNITY)
Admission: RE | Admit: 2019-05-07 | Discharge: 2019-05-13 | DRG: 560 | Disposition: A | Discharge status: Home Health | Payer: No Typology Code available for payment source | Source: Intra-hospital | Attending: Physical Medicine and Rehabilitation | Admitting: Physical Medicine and Rehabilitation

## 2019-05-07 DIAGNOSIS — Z7984 Long term (current) use of oral hypoglycemic drugs: Secondary | ICD-10-CM | POA: Diagnosis not present

## 2019-05-07 DIAGNOSIS — S72011S Unspecified intracapsular fracture of right femur, sequela: Secondary | ICD-10-CM

## 2019-05-07 DIAGNOSIS — Z79899 Other long term (current) drug therapy: Secondary | ICD-10-CM | POA: Diagnosis not present

## 2019-05-07 DIAGNOSIS — Z9049 Acquired absence of other specified parts of digestive tract: Secondary | ICD-10-CM

## 2019-05-07 DIAGNOSIS — Z90722 Acquired absence of ovaries, bilateral: Secondary | ICD-10-CM | POA: Diagnosis not present

## 2019-05-07 DIAGNOSIS — Z9079 Acquired absence of other genital organ(s): Secondary | ICD-10-CM | POA: Diagnosis not present

## 2019-05-07 DIAGNOSIS — Z96641 Presence of right artificial hip joint: Secondary | ICD-10-CM | POA: Diagnosis present

## 2019-05-07 DIAGNOSIS — K5900 Constipation, unspecified: Secondary | ICD-10-CM | POA: Diagnosis present

## 2019-05-07 DIAGNOSIS — Z22322 Carrier or suspected carrier of Methicillin resistant Staphylococcus aureus: Secondary | ICD-10-CM | POA: Diagnosis not present

## 2019-05-07 DIAGNOSIS — S52101S Unspecified fracture of upper end of right radius, sequela: Secondary | ICD-10-CM | POA: Diagnosis not present

## 2019-05-07 DIAGNOSIS — Z90710 Acquired absence of both cervix and uterus: Secondary | ICD-10-CM | POA: Diagnosis not present

## 2019-05-07 DIAGNOSIS — Z7982 Long term (current) use of aspirin: Secondary | ICD-10-CM | POA: Diagnosis not present

## 2019-05-07 DIAGNOSIS — I10 Essential (primary) hypertension: Secondary | ICD-10-CM | POA: Diagnosis present

## 2019-05-07 DIAGNOSIS — Z888 Allergy status to other drugs, medicaments and biological substances status: Secondary | ICD-10-CM

## 2019-05-07 DIAGNOSIS — G894 Chronic pain syndrome: Secondary | ICD-10-CM | POA: Diagnosis present

## 2019-05-07 DIAGNOSIS — Z7951 Long term (current) use of inhaled steroids: Secondary | ICD-10-CM | POA: Diagnosis not present

## 2019-05-07 DIAGNOSIS — IMO0002 Reserved for concepts with insufficient information to code with codable children: Secondary | ICD-10-CM | POA: Diagnosis present

## 2019-05-07 DIAGNOSIS — M7989 Other specified soft tissue disorders: Secondary | ICD-10-CM

## 2019-05-07 DIAGNOSIS — S72019A Unspecified intracapsular fracture of unspecified femur, initial encounter for closed fracture: Secondary | ICD-10-CM | POA: Diagnosis present

## 2019-05-07 DIAGNOSIS — Z79891 Long term (current) use of opiate analgesic: Secondary | ICD-10-CM

## 2019-05-07 DIAGNOSIS — S52121S Displaced fracture of head of right radius, sequela: Secondary | ICD-10-CM

## 2019-05-07 DIAGNOSIS — Z85828 Personal history of other malignant neoplasm of skin: Secondary | ICD-10-CM

## 2019-05-07 DIAGNOSIS — Z96652 Presence of left artificial knee joint: Secondary | ICD-10-CM | POA: Diagnosis present

## 2019-05-07 DIAGNOSIS — E1365 Other specified diabetes mellitus with hyperglycemia: Secondary | ICD-10-CM | POA: Diagnosis present

## 2019-05-07 DIAGNOSIS — Z833 Family history of diabetes mellitus: Secondary | ICD-10-CM

## 2019-05-07 DIAGNOSIS — E119 Type 2 diabetes mellitus without complications: Secondary | ICD-10-CM | POA: Diagnosis present

## 2019-05-07 DIAGNOSIS — I82432 Acute embolism and thrombosis of left popliteal vein: Secondary | ICD-10-CM | POA: Diagnosis present

## 2019-05-07 DIAGNOSIS — S5291XA Unspecified fracture of right forearm, initial encounter for closed fracture: Secondary | ICD-10-CM | POA: Diagnosis present

## 2019-05-07 DIAGNOSIS — K5901 Slow transit constipation: Secondary | ICD-10-CM | POA: Diagnosis not present

## 2019-05-07 DIAGNOSIS — Z471 Aftercare following joint replacement surgery: Secondary | ICD-10-CM | POA: Diagnosis present

## 2019-05-07 DIAGNOSIS — S52181D Other fracture of upper end of right radius, subsequent encounter for closed fracture with routine healing: Secondary | ICD-10-CM | POA: Diagnosis not present

## 2019-05-07 LAB — BASIC METABOLIC PANEL
Anion gap: 11 (ref 5–15)
BUN: 14 mg/dL (ref 8–23)
CO2: 25 mmol/L (ref 22–32)
Calcium: 8.6 mg/dL — ABNORMAL LOW (ref 8.9–10.3)
Chloride: 101 mmol/L (ref 98–111)
Creatinine, Ser: 0.84 mg/dL (ref 0.44–1.00)
GFR calc Af Amer: >60 mL/min (ref >60)
GFR calc non Af Amer: >60 mL/min (ref >60)
Glucose, Bld: 166 mg/dL — ABNORMAL HIGH (ref 70–99)
Potassium: 3.8 mmol/L (ref 3.5–5.1)
Sodium: 137 mmol/L (ref 135–145)

## 2019-05-07 LAB — CBC
HCT: 33.7 % — ABNORMAL LOW (ref 36.0–46.0)
Hemoglobin: 11.4 g/dL — ABNORMAL LOW (ref 12.0–15.0)
MCH: 31.1 pg (ref 26.0–34.0)
MCHC: 33.8 g/dL (ref 30.0–36.0)
MCV: 91.8 fL (ref 80.0–100.0)
Platelets: 213 10*3/uL (ref 150–400)
RBC: 3.67 MIL/uL — ABNORMAL LOW (ref 3.87–5.11)
RDW: 14.3 % (ref 11.5–15.5)
WBC: 10.5 10*3/uL (ref 4.0–10.5)
nRBC: 0 % (ref 0.0–0.2)

## 2019-05-07 LAB — GLUCOSE, CAPILLARY
Glucose-Capillary: 166 mg/dL — ABNORMAL HIGH (ref 70–99)
Glucose-Capillary: 229 mg/dL — ABNORMAL HIGH (ref 70–99)
Glucose-Capillary: 204 mg/dL — ABNORMAL HIGH (ref 70–99)
Glucose-Capillary: 187 mg/dL — ABNORMAL HIGH (ref 70–99)

## 2019-05-07 LAB — MAGNESIUM: Magnesium: 2 mg/dL (ref 1.7–2.4)

## 2019-05-07 MED ORDER — NAPROXEN 250 MG PO TABS
250.0000 mg | ORAL_TABLET | Freq: Two times a day (BID) | ORAL | Status: DC
  Administered 2019-05-07 – 2019-05-10 (×6): 250 mg via ORAL
  Filled 2019-05-07 (×6): qty 1

## 2019-05-07 MED ORDER — POLYETHYLENE GLYCOL 3350 17 G PO PACK: 17.0000 g | PACK | Freq: Every day | ORAL | Status: DC | PRN

## 2019-05-07 MED ORDER — OXYCODONE HCL 5 MG PO TABS
5.0000 mg | ORAL_TABLET | ORAL | Status: DC | PRN
  Administered 2019-05-08 (×2): 10 mg via ORAL
  Administered 2019-05-08: 5 mg via ORAL
  Administered 2019-05-10 – 2019-05-12 (×5): 10 mg via ORAL
  Filled 2019-05-07 (×7): qty 2
  Filled 2019-05-07: qty 1

## 2019-05-07 MED ORDER — METHOCARBAMOL 500 MG PO TABS
500.0000 mg | ORAL_TABLET | Freq: Four times a day (QID) | ORAL | Status: DC | PRN
  Administered 2019-05-11 – 2019-05-12 (×2): 500 mg via ORAL
  Filled 2019-05-07 (×2): qty 1

## 2019-05-07 MED ORDER — RIVAROXABAN 20 MG PO TABS: 20.0000 mg | ORAL_TABLET | Freq: Every day | ORAL | Status: DC

## 2019-05-07 MED ORDER — METHOCARBAMOL 1000 MG/10ML IJ SOLN
500.0000 mg | Freq: Four times a day (QID) | INTRAVENOUS | Status: DC | PRN
  Filled 2019-05-07: qty 5

## 2019-05-07 MED ORDER — HYDROCODONE-ACETAMINOPHEN 5-325 MG PO TABS
1.0000 | ORAL_TABLET | Freq: Four times a day (QID) | ORAL | Status: DC | PRN
  Administered 2019-05-07 – 2019-05-09 (×4): 2 via ORAL
  Administered 2019-05-09 – 2019-05-10 (×3): 1 via ORAL
  Administered 2019-05-10 – 2019-05-13 (×7): 2 via ORAL
  Filled 2019-05-07 (×9): qty 2
  Filled 2019-05-07: qty 1
  Filled 2019-05-07: qty 2
  Filled 2019-05-07: qty 1
  Filled 2019-05-07: qty 2
  Filled 2019-05-07: qty 1

## 2019-05-07 MED ORDER — METHOCARBAMOL 500 MG PO TABS: 500.0000 mg | ORAL_TABLET | Freq: Four times a day (QID) | ORAL | Status: DC | PRN

## 2019-05-07 MED ORDER — LISINOPRIL 5 MG PO TABS
5.0000 mg | ORAL_TABLET | Freq: Every day | ORAL | Status: DC
  Administered 2019-05-07 – 2019-05-13 (×7): 5 mg via ORAL
  Filled 2019-05-07 (×8): qty 1

## 2019-05-07 MED ORDER — MUPIROCIN 2 % EX OINT
1.0000 "application " | TOPICAL_OINTMENT | Freq: Two times a day (BID) | CUTANEOUS | Status: AC
  Administered 2019-05-07 – 2019-05-09 (×5): 1 via NASAL
  Filled 2019-05-07: qty 22

## 2019-05-07 MED ORDER — ASPIRIN 81 MG PO TABS: 81.0000 mg | ORAL_TABLET | Freq: Two times a day (BID) | ORAL | Status: DC

## 2019-05-07 MED ORDER — GLIPIZIDE 10 MG PO TABS
10.0000 mg | ORAL_TABLET | Freq: Two times a day (BID) | ORAL | Status: DC
  Administered 2019-05-07 – 2019-05-13 (×12): 10 mg via ORAL
  Filled 2019-05-07 (×5): qty 1
  Filled 2019-05-07: qty 2
  Filled 2019-05-07 (×8): qty 1

## 2019-05-07 MED ORDER — PIOGLITAZONE HCL 15 MG PO TABS
30.0000 mg | ORAL_TABLET | Freq: Every day | ORAL | Status: DC
  Administered 2019-05-08 – 2019-05-10 (×3): 30 mg via ORAL
  Filled 2019-05-07 (×3): qty 2

## 2019-05-07 MED ORDER — DICLOFENAC SODIUM 1 % TD GEL: 2.0000 g | Freq: Every evening | TRANSDERMAL | Status: DC | PRN

## 2019-05-07 MED ORDER — BISACODYL 5 MG PO TBEC
5.0000 mg | DELAYED_RELEASE_TABLET | Freq: Every day | ORAL | Status: DC | PRN
  Administered 2019-05-09: 5 mg via ORAL
  Filled 2019-05-07: qty 1

## 2019-05-07 MED ORDER — INSULIN ASPART 100 UNIT/ML ~~LOC~~ SOLN
0.0000 [IU] | Freq: Three times a day (TID) | SUBCUTANEOUS | Status: DC
  Administered 2019-05-07: 5 [IU] via SUBCUTANEOUS
  Administered 2019-05-08 (×2): 3 [IU] via SUBCUTANEOUS
  Administered 2019-05-08: 2 [IU] via SUBCUTANEOUS
  Administered 2019-05-09 (×3): 3 [IU] via SUBCUTANEOUS
  Administered 2019-05-10 (×2): 5 [IU] via SUBCUTANEOUS
  Administered 2019-05-10 – 2019-05-11 (×3): 3 [IU] via SUBCUTANEOUS
  Administered 2019-05-11: 5 [IU] via SUBCUTANEOUS
  Administered 2019-05-12 (×2): 3 [IU] via SUBCUTANEOUS
  Administered 2019-05-12: 2 [IU] via SUBCUTANEOUS
  Administered 2019-05-13: 3 [IU] via SUBCUTANEOUS
  Administered 2019-05-13: 2 [IU] via SUBCUTANEOUS

## 2019-05-07 MED ORDER — ONDANSETRON HCL 4 MG/2ML IJ SOLN: 4.0000 mg | Freq: Four times a day (QID) | INTRAMUSCULAR | Status: DC | PRN

## 2019-05-07 MED ORDER — DOCUSATE SODIUM 100 MG PO CAPS: 100.0000 mg | ORAL_CAPSULE | Freq: Two times a day (BID) | ORAL | Status: DC

## 2019-05-07 MED ORDER — ASPIRIN EC 81 MG PO TBEC
81.0000 mg | DELAYED_RELEASE_TABLET | Freq: Two times a day (BID) | ORAL | Status: DC
  Administered 2019-05-07 – 2019-05-10 (×6): 81 mg via ORAL
  Filled 2019-05-07 (×6): qty 1

## 2019-05-07 MED ORDER — ONDANSETRON HCL 4 MG PO TABS: 4.0000 mg | ORAL_TABLET | Freq: Four times a day (QID) | ORAL | Status: DC | PRN

## 2019-05-07 MED ORDER — RIVAROXABAN 15 MG PO TABS
15.0000 mg | ORAL_TABLET | Freq: Two times a day (BID) | ORAL | Status: DC
  Administered 2019-05-07 – 2019-05-08 (×2): 15 mg via ORAL
  Filled 2019-05-07 (×2): qty 1

## 2019-05-07 MED ORDER — CHLORHEXIDINE GLUCONATE CLOTH 2 % EX PADS: 6.0000 | MEDICATED_PAD | Freq: Every day | CUTANEOUS | Status: AC

## 2019-05-07 MED ORDER — SORBITOL 70 % SOLN
30.0000 mL | Freq: Every day | Status: DC | PRN
  Administered 2019-05-08: 30 mL via ORAL
  Filled 2019-05-07: qty 30

## 2019-05-07 MED ORDER — ACETAMINOPHEN 325 MG PO TABS
325.0000 mg | ORAL_TABLET | Freq: Four times a day (QID) | ORAL | Status: DC | PRN
  Filled 2019-05-07: qty 2

## 2019-05-07 MED ORDER — FLUTICASONE PROPIONATE 50 MCG/ACT NA SUSP
2.0000 | Freq: Every day | NASAL | Status: DC
  Administered 2019-05-08 – 2019-05-13 (×6): 2 via NASAL

## 2019-05-07 NOTE — Progress Notes (Signed)
Received results from doppler study. Called Dr Naaman Plummer. NO: Bed rest now and evaluate in AM. Start Xarelto 15mg  PO q 12 with 1st dose now.

## 2019-05-07 NOTE — Progress Notes (Signed)
  Subjective: Claudia [name] is a 63 y.o. female s/p right THA.  They are POD3.  Pt's pain is controlled.  Pt has ambulated with great difficulty in PT. She has been accepted for inpatient rehab at [facility].      Objective: Vital signs in last 24 hours: Temp:  [98.4 F (36.9 C)-98.9 F (37.2 C)] 98.7 F (37.1 C) (09/25 0737) Pulse Rate:  [75-87] 87 (09/25 0737) Resp:  [16] 16 (09/25 0737) BP: (109-122)/(46-56) 109/46 (09/25 0737) SpO2:  [93 %-98 %] 98 % (09/25 0737)  Intake/Output from previous day: 09/24 0701 - 09/25 0700 In: 940 [P.O.:940] Out: 1950 [Urine:1950] Intake/Output this shift: Total I/O In: 240 [P.O.:240] Out: 400 [Urine:400]  Exam:  No gross blood or drainage overlying the wound vac 2+ DP pulse Sensation intact distally in the right foot Able to dorsiflex and plantarflex the right foot   Labs: Recent Labs    05/05/19 0315 05/06/19 0227 05/07/19 0531  HGB 11.2* 11.2* 11.4*   Recent Labs    05/06/19 0227 05/07/19 0531  WBC 11.7* 10.5  RBC 3.70* 3.67*  HCT 34.6* 33.7*  PLT 197 213   Recent Labs    05/06/19 0227 05/07/19 0531  NA 138 137  K 3.8 3.8  CL 104 101  CO2 23 25  BUN 12 14  CREATININE 0.95 0.84  GLUCOSE 135* 166*  CALCIUM 8.3* 8.6*   No results for input(s): LABPT, INR in the last 72 hours.  Assessment/Plan: Pt is POD3 s/p right THA.    -Transfer to CIR  -Remove wound vac on 9/27 and place aquacel dressing over the incision. Patient may shower with Aquacel dressing but no bathing or swimming             -WBAT RLE with a platform walker             -NWB RUE.  Okay to use platform walker with RUE             -ASA 81mg  BID for DVT prophylaxis             -F/u in clinic with Dr. Marlou Sa 2 weeks postop     Claudia [name] Claudia [name] 05/07/2019, 2:08 PM

## 2019-05-07 NOTE — Plan of Care (Signed)
  Problem: Clinical Measurements: Goal: Will remain free from infection Outcome: Progressing   Problem: Coping: Goal: Level of anxiety will decrease Outcome: Progressing   Problem: Pain Managment: Goal: General experience of comfort will improve Outcome: Progressing   Problem: Safety: Goal: Ability to remain free from injury will improve Outcome: Progressing   

## 2019-05-07 NOTE — Progress Notes (Signed)
Report given to Santiago Glad, Therapist, sports.  The patient is ready for transfer to 4 M  Room 1  The nurse was made aware of wound vac instructions, scheduled for 09/27.  Aquacel dressing supplied per MD and with be taken with the patient.  The patient will be transported via bed.  The patient is aware of the transfer, along with family members.
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
## 2019-05-07 NOTE — Progress Notes (Signed)
Inpatient Rehabilitation-Admissions Coordinator   I have received insurance approval and medical approval for admit to CIR today. Pt excited to get started with rehab. We have reviewed insurance letter and consent form was signed. RN, CM/SW updated on plan.   Please call if questions.   Jhonnie Garner, OTR/L  Rehab Admissions Coordinator  [phone] 05/07/2019 1:56 PM

## 2019-05-07 NOTE — Plan of Care (Signed)
1 

## 2019-05-07 NOTE — Progress Notes (Signed)
Bilateral lower extremity venous duplex completed. Preliminary results in Chart review CV Proc. [facility], [place] 05/07/2019, 6:27 PM

## 2019-05-07 NOTE — Progress Notes (Signed)
Physical Therapy Treatment Patient Details Name: EIBHLEANN RAUBER MRN: [id number] DOB: 12/25/1955 Today's Date: 05/07/2019    History of Present Illness 63 y.o. female with medical history significant of HTN; DM; and chronic pain from OA presenting with a fall at work resulting in R radial head fx and R femoral neck fx. Pt now s/p R THA (direct anterior approach) and splingting of RUE.    PT Comments    Pt limited by pain some during session, tolerating multiple transfers on this date, however refusing further attempts at ambulation. Pt with decreased assistance requirements during bed mobility and transfers, although continues to require verbal and tactile cueing to maintain NWB through RUE. Pt will continue to benefit from acute PT services to reduce falls risk and restore her prior level of function. Recommendations: CIR   Follow Up Recommendations  CIR     Equipment Recommendations  3in1 (PT);Wheelchair (measurements PT)    Recommendations for Other Services       Precautions / Restrictions Precautions Precautions: Fall Restrictions Weight Bearing Restrictions: Yes RUE Weight Bearing: Non weight bearing RLE Weight Bearing: Weight bearing as tolerated    Mobility  Bed Mobility   Bed Mobility: Supine to Sit     Supine to sit: Min guard        Transfers Overall transfer level: Needs assistance Equipment used: Hemi-walker Transfers: Sit to/from Stand Sit to Stand: Min assist            Ambulation/Gait Ambulation/Gait assistance: Min guard Gait Distance (Feet): 3 Feet(2nd trial of 5 feet) Assistive device: Hemi-walker Gait Pattern/deviations: Step-to pattern;Decreased step length - right;Decreased step length - left;Decreased stance time - right;Decreased stance time - left;Decreased weight shift to right Gait velocity: decreased significantly Gait velocity interpretation: <1.31 ft/sec, indicative of household Nurse, children's Rankin (Stroke Patients Only)       Balance   Sitting-balance support: Single extremity supported;Feet supported Sitting balance-Leahy Scale: Fair     Standing balance support: Single extremity supported Standing balance-Leahy Scale: Fair                              Cognition Arousal/Alertness: Awake/alert Behavior During Therapy: WFL for tasks assessed/performed Overall Cognitive Status: Within Functional Limits for tasks assessed                                        Exercises Other Exercises Other Exercises: RLE hip flexion PROM 3 reps for HEP education    General Comments        Pertinent Vitals/Pain Pain Assessment: Faces Faces Pain Scale: Hurts even more Pain Location: R hip Pain Descriptors / Indicators: Sore Pain Intervention(s): Limited activity within patient's tolerance    Home Living                      Prior Function            PT Goals (current goals can now be found in the care plan section) Acute Rehab PT Goals Patient Stated Goal: To return to prior level of function Progress towards PT goals: Progressing toward goals    Frequency    Min 5X/week      PT Plan Current plan remains  appropriate    Co-evaluation              AM-PAC PT "6 Clicks" Mobility   Outcome Measure  Help needed turning from your back to your side while in a flat bed without using bedrails?: A Little Help needed moving from lying on your back to sitting on the side of a flat bed without using bedrails?: A Little Help needed moving to and from a bed to a chair (including a wheelchair)?: A Little Help needed standing up from a chair using your arms (e.g., wheelchair or bedside chair)?: A Little Help needed to walk in hospital room?: A Lot Help needed climbing 3-5 steps with a railing? : Total 6 Click Score: 15    End of Session Equipment Utilized During Treatment: Gait  belt Activity Tolerance: Patient limited by pain Patient left: in chair;with call bell/phone within reach;with chair alarm set Nurse Communication: Mobility status PT Visit Diagnosis: Other abnormalities of gait and mobility (R26.89)     Time: [id number] PT Time Calculation (min) (ACUTE ONLY): 23 min  Charges:  $Gait Training: 8-22 mins $Therapeutic Activity: 8-22 mins                     Zenaida Niece, PT, DPT Acute Rehabilitation Pager: [phone]    Zenaida Niece 05/07/2019, 11:20 AM
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
## 2019-05-07 NOTE — Progress Notes (Signed)
Meredith Staggers, MD  Physician  Physical Medicine and Rehabilitation  PMR Pre-admission  Signed  Date of Service:  05/06/2019 6:21 PM      Related encounter: ED to Hosp-Admission (Current) from 05/03/2019 in [facility]         PMR Admission Coordinator Pre-Admission Assessment  Patient: Claudia [name] is an 63 y.o., female MRN: [id number] DOB: 02-02-1956 Height: 5' 7.5" (171.5 cm) Weight: 107.5 kg  Insurance Information HMO:     PPO:      PCP:      IPA:      80/20:      OTHER:  PRIMARY: Generic Worker's Comp/[place] CMS      Policy#: (claim #:  [id number] )    Subscriber: Patient CM Name: Abigail Butts      Phone#: [phone]     Fax#: [phone] Pre-Cert#: Claim number: [id number]       Approval provided by Abigail Butts on 9/25 for admit to CIR. Pt is approved for 7 day stay in acute rehab facility. Claim Examiner: Olga Coaster (per Chad Cordial may be off next week so if there are any questions you can contact Abigail Butts) Date of Injury: 05/03/2019   Employer: [employer] Benefits:  Phone #: NA     Name: NA, per worker's comp Eff. Date: (Date of Injury 05/03/2019)     Deduct:       Out of Pocket Max:       Life Max:  CIR: Covered per [employer] Comp      SNF:  Outpatient:      Co-Pay:  Home Health:       Co-Pay:  DME:      Co-Pay:  Providers:  SECONDARY: None     Policy#:       Subscriber:  CM Name:       Phone#:      Fax#:  Pre-Cert#:       Employer: Benefits:  Phone #:      Name:  Eff. Date:      Deduct:       Out of Pocket Max:       Life Max:  CIR:       SNF:  Outpatient:      Co-Pay: Home Health:       Co-Pay:  DME:      Co-Pay:   Medicaid Application Date:       Case Manager: Disability Application Date:       Case Worker:   The Data Collection Information Summary for patients in Inpatient Rehabilitation Facilities with attached Privacy Act [place] Records was provided and verbally  reviewed with: N/A  Emergency Contact Information         Contact Information    Name Relation Home Work [place] Daughter   [phone]   Natosha, Bou [phone]        Current Medical History  Patient Admitting Diagnosis: multi-ortho trauma  History of Present Illness: Claudia [name] is a 63 year old female with history of hypertension, diabetes mellitus chronic bilateral knee pain and low back painmaintained on hydrocodone 1 to 2 tablets at bedtime as needed as well as Ultram 1 tablet every 6 hours as needed. 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 Denied loss of consciousness. X-rays and imaging revealed right subcapital femoral neck fracture as well as minimally  displaced right radial head fracture. Underwent right total hip arthroplasty anterior approach 05/04/2019 per Dr. Marlou Sa.Prevanawound VAC placed x5 days to help incision healed and then change to Aquacel dressing. Weightbearing as tolerated right lower extremity. Conservative care of right radial head fracture nonweightbearing using platform walker. Hospital course pain management.PCR screening positive completing course of Bactroban.Placed on aspirin 81 mg twice daily for DVT prophylaxis. Therapy evaluation completed and patient is to be admitted for a comprehensive rehab program on 05/07/2019.    Patient's medical record from [facility] has been reviewed by the rehabilitation admission coordinator and physician.  Past Medical History      Past Medical History:  Diagnosis Date   Chronic pain syndrome    chronic bilat knee pain and LBP secondary to osteoarthritis.   Complication of anesthesia    Diabetes mellitus without complication (HCC)    Hypertension    Osteoarthritis of multiple joints    Left TKR, chronic R knee pain   PONV (postoperative nausea and vomiting)    SCC  (squamous cell carcinoma), scalp/neck    scalp (removed 11/2015)    Family History   family history includes Cancer in her mother; Diabetes in her mother.  Prior Rehab/Hospitalizations Has the patient had prior rehab or hospitalizations prior to admission? No  Has the patient had major surgery during 100 days prior to admission? Yes             Current Medications  Current Facility-Administered Medications:    acetaminophen (TYLENOL) tablet 325-650 mg, 325-650 mg, Oral, Q6H PRN, Magnant, Charles L, PA-C, 650 mg at 05/05/19 0258   aspirin EC tablet 81 mg, 81 mg, Oral, BID, Magnant, Charles L, PA-C, 81 mg at 05/07/19 0850   bisacodyl (DULCOLAX) EC tablet 5 mg, 5 mg, Oral, Daily PRN, Karmen Bongo, MD   Chlorhexidine Gluconate Cloth 2 % PADS 6 each, 6 each, Topical, Daily, Marlou Sa, Tonna Corner, MD, 6 each at 05/07/19 1311   docusate sodium (COLACE) capsule 100 mg, 100 mg, Oral, BID, Magnant, Charles L, PA-C, 100 mg at 05/07/19 0850   feeding supplement (ENSURE ENLIVE) (ENSURE ENLIVE) liquid 237 mL, 237 mL, Oral, Q1500, Allie Bossier, MD, 237 mL at 05/06/19 1431   feeding supplement (PRO-STAT SUGAR FREE 64) liquid 30 mL, 30 mL, Oral, Daily, Allie Bossier, MD, 30 mL at 05/07/19 0850   fluticasone (FLONASE) 50 MCG/ACT nasal spray 2 spray, 2 spray, Each Nare, Daily, Karmen Bongo, MD, 2 spray at 05/07/19 0851   HYDROcodone-acetaminophen (NORCO/VICODIN) 5-325 MG per tablet 1-2 tablet, 1-2 tablet, Oral, Q6H PRN, Karmen Bongo, MD, 2 tablet at 05/07/19 0856   HYDROmorphone (DILAUDID) injection 0.5-1 mg, 0.5-1 mg, Intravenous, Q4H PRN, Magnant, Charles L, PA-C, 1 mg at 05/07/19 0227   insulin aspart (novoLOG) injection 0-15 Units, 0-15 Units, Subcutaneous, TID WC, Karmen Bongo, MD, 8 Units at 05/07/19 1300   insulin aspart (novoLOG) injection 0-5 Units, 0-5 Units, Subcutaneous, QHS, Karmen Bongo, MD, 2 Units at 05/04/19 2256   methocarbamol (ROBAXIN) tablet 500 mg,  500 mg, Oral, Q6H PRN, 500 mg at 05/03/19 1639 **OR** methocarbamol (ROBAXIN) 500 mg in dextrose 5 % 50 mL IVPB, 500 mg, Intravenous, Q6H PRN, Karmen Bongo, MD   methocarbamol (ROBAXIN) tablet 500 mg, 500 mg, Oral, Q6H PRN, 500 mg at 05/07/19 0857 **OR** methocarbamol (ROBAXIN) 500 mg in dextrose 5 % 50 mL IVPB, 500 mg, Intravenous, Q6H PRN, Magnant, Charles L, PA-C   metoCLOPramide (REGLAN) tablet 5-10 mg, 5-10 mg, Oral, Q8H  PRN **OR** metoCLOPramide (REGLAN) injection 5-10 mg, 5-10 mg, Intravenous, Q8H PRN, Magnant, Charles L, PA-C   morphine 2 MG/ML injection 0.5 mg, 0.5 mg, Intravenous, Q2H PRN, Karmen Bongo, MD, 0.5 mg at 05/04/19 0830   mupirocin ointment (BACTROBAN) 2 % 1 application, 1 application, Nasal, BID, Marlou Sa, Tonna Corner, MD, 1 application at 56/43/32 0851   naproxen (NAPROSYN) tablet 250 mg, 250 mg, Oral, BID WC, Magnant, Charles L, PA-C, 250 mg at 05/07/19 0851   ondansetron (ZOFRAN) tablet 4 mg, 4 mg, Oral, Q6H PRN **OR** ondansetron (ZOFRAN) injection 4 mg, 4 mg, Intravenous, Q6H PRN, Magnant, Charles L, PA-C   oxyCODONE (Oxy IR/ROXICODONE) immediate release tablet 5-10 mg, 5-10 mg, Oral, Q4H PRN, Magnant, Charles L, PA-C, 10 mg at 05/06/19 2031   polyethylene glycol (MIRALAX / GLYCOLAX) packet 17 g, 17 g, Oral, Daily PRN, Karmen Bongo, MD  Patients Current Diet:     Diet Order                  Diet - low sodium heart healthy         Diet Carb Modified         Diet Carb Modified Fluid consistency: Thin; Room service appropriate? Yes  Diet effective now               Precautions / Restrictions Precautions Precautions: Fall Restrictions Weight Bearing Restrictions: Yes RUE Weight Bearing: Non weight bearing RLE Weight Bearing: Weight bearing as tolerated   Has the patient had 2 or more falls or a fall with injury in the past year? 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 Independent  Indoor Mobility: Did the patient need assistance with walking from room to room (with or without device)? Independent  Stairs: Did the patient need assistance with internal or external stairs (with or without device)? Independent  Functional Cognition: Did the patient need help planning regular tasks such as shopping or remembering to take medications? Independent  Home Assistive Devices / Equipment Home Assistive Devices/Equipment: None Home Equipment: Walker - 2 wheels, Shower seat  Prior Device Use: Indicate devices/aids used by the patient prior to current illness, exacerbation or injury? None of the above  Current Functional Level Cognition  Overall Cognitive Status: Within Functional Limits for tasks assessed Orientation Level: Oriented X4    Extremity Assessment (includes Sensation/Coordination)  Upper Extremity Assessment: RUE deficits/detail RUE Deficits / Details: R radius fx with mild edema, full AROM RUE Sensation: WNL RUE Coordination: WNL  Lower Extremity Assessment: Defer to PT evaluation RLE Deficits / Details: ROM WFL, Strength grossly 3+/5 RLE Sensation: WNL    ADLs  Overall ADL's : Needs assistance/impaired Eating/Feeding: Independent, Sitting Grooming: Brushing hair, Sitting, Set up Grooming Details (indicate cue type and reason): with L hand Upper Body Bathing: Moderate assistance, Sitting Lower Body Bathing: Maximal assistance, Sit to/from stand Upper Body Dressing : Moderate assistance, Sitting Lower Body Dressing: Maximal assistance, Sit to/from stand Toilet Transfer: Minimal assistance, Stand-pivot, BSC Toileting- Clothing Manipulation and Hygiene: Minimal assistance, Sit to/from stand    Mobility  Overal bed mobility: Needs Assistance Bed Mobility: Supine to Sit Supine to sit:  Min guard General bed mobility comments: pt received in chair    Transfers  Overall transfer level: Needs assistance Equipment used: Hemi-walker Transfers: Sit to/from Stand Sit to Stand:  Min assist Stand pivot transfers: Min assist General transfer comment: assist to rise and steady    Ambulation / Gait / Stairs / Emergency planning/management officer  Ambulation/Gait Ambulation/Gait assistance: Counsellor (Feet): 3 Feet(2nd trial of 5 feet) Assistive device: Hemi-walker Gait Pattern/deviations: Step-to pattern, Decreased step length - right, Decreased step length - left, Decreased stance time - right, Decreased stance time - left, Decreased weight shift to right Gait velocity: decreased significantly Gait velocity interpretation: <1.31 ft/sec, indicative of household ambulator    Posture / Balance Balance Overall balance assessment: Needs assistance Sitting-balance support: Single extremity supported, Feet supported Sitting balance-Leahy Scale: Fair Standing balance support: Single extremity supported Standing balance-Leahy Scale: Fair Standing balance comment: requires one hand stabilization    Special needs/care consideration BiPAP/CPAP : no CPM : no Continuous Drip IV : no Dialysis : no        Days : no Life Vest : no Oxygen : no Special Bed : no Trach Size : no Wound Vac (area) : yes      Location : RLE (hip region) Skin : surgical incision to right hip; negative pressure wound therapy to right hip; ecchymosis to posterior toe                     Bowel mgmt: last BM: 05/03/2019 Bladder mgmt: incontinence, external catheter  Diabetic mgmt: yes Behavioral consideration : no Chemo/radiation : no   Previous Home Environment (from acute therapy documentation) Living Arrangements: Alone Available Help at Discharge: Family, Available 24 hours/day Type of Home: House Home Layout: One level Home Access: Stairs to enter Entrance Stairs-Rails: None Entrance  Stairs-Number of Steps: 1 Bathroom Shower/Tub: Optometrist: Yes Home Care Services: No  Discharge Living Setting Plans for Discharge Living Setting: Patient's home, House, Other (Comment)(lives alone but will have 24/7 A from son,daughter, & sister) Type of Home at Discharge: House Discharge Home Layout: One level Discharge Home Access: Stairs to enter Entrance Stairs-Rails: None Entrance Stairs-Number of Steps: 1 Discharge Bathroom Shower/Tub: Tub/shower unit Discharge Bathroom Toilet: Handicapped height Discharge Bathroom Accessibility: Yes How Accessible: Accessible via walker Does the patient have any problems obtaining your medications?: No  Social/Family/Support Systems Patient Roles: Other (Comment)(works part time; has grown children and sister ) Contact Information: Fransisco Beau (son): [phone]; Karlene Einstein (daughter): [phone] Anticipated Caregiver: son, daugther, and sister (plan is for rotating schedule (someone will take time off work on rotating schedule). Fransisco Beau to take time off first per son report.  Anticipated Caregiver's Contact Information: see above Ability/Limitations of Caregiver: Min A  Caregiver Availability: 24/7 Discharge Plan Discussed with Primary Caregiver: Yes(with pt and son) Is Caregiver In Agreement with Plan?: Yes Does Caregiver/Family have Issues with Lodging/Transportation while Pt is in Rehab?: No  Goals/Additional Needs Patient/Family Goal for Rehab: PT/OT: Mod I; SLP: NA Expected length of stay: 4-6 days Cultural Considerations: NA Dietary Needs: carb modified; thin liquids; calorie level 1600-2000 Equipment Needs: TBD Special Service Needs: NA Pt/Family Agrees to Admission and willing to participate: Yes Program Orientation Provided & Reviewed with Pt/Caregiver Including Roles  & Responsibilities: Yes(pt and her son)  Barriers to Discharge: Home environment access/layout, Weight bearing  restrictions  Barriers to Discharge Comments: one step to enter home.   Decrease burden of Care through IP rehab admission: NA  Possible need for SNF placement upon discharge: Not anticipated; pt has great family support with plan for someone to stay with her 24/7 at DC. Pt lives in 1  story home so there is very little environmental barriers.   Patient Condition: I have reviewed medical records from [facility], spoken with MD, and patient and son. I met with patient at the bedside for inpatient rehabilitation assessment.  Patient will benefit from ongoing PT and OT, can actively participate in 3 hours of therapy a day 5 days of the week, and can make measurable gains during the admission.  Patient will also benefit from the coordinated team approach during an Inpatient Acute Rehabilitation admission.  The patient will receive intensive therapy as well as Rehabilitation physician, nursing, social worker, and care management interventions.  Due to safety, skin/wound care, disease management, medication administration, pain management and patient education the patient requires 24 hour a day rehabilitation nursing.  The patient is currently Min A for transfers and Min G 3 feet with hemi-walker with mobility and Mod to max A for basic ADLs.  Discharge setting and therapy post discharge at home with home health is anticipated.  Patient has agreed to participate in the Acute Inpatient Rehabilitation Program and will admit 05/07/2019.  Preadmission Screen Completed By:  Jhonnie Garner, 05/07/2019 2:02 PM ______________________________________________________________________   Discussed status with Dr. Naaman Plummer on 05/07/2019 at 1:24PM and received approval for admission today.  Admission Coordinator:  Jhonnie Garner, OT, time 1:24PM/Date 05/07/2019   Assessment/Plan: Diagnosis: right femoral neck, right radial head fx 1. Does the need for close, 24 hr/day Medical supervision in concert with the  patient's rehab needs make it unreasonable for this patient to be served in a less intensive setting? Yes 2. Co-Morbidities requiring supervision/potential complications: dm, htn, chronic pain syndrome 3. Due to bladder management, bowel management, safety, skin/wound care, disease management, medication administration, pain management and patient education, does the patient require 24 hr/day rehab nursing? Yes 4. Does the patient require coordinated care of a physician, rehab nurse, PT (1-2 hrs/day, 5 days/week) and OT (1-2 hrs/day, 5 days/week) to address physical and functional deficits in the context of the above medical diagnosis(es)? Yes Addressing deficits in the following areas: balance, endurance, locomotion, strength, transferring, bowel/bladder control, bathing, dressing, feeding, grooming, toileting and psychosocial support 5. Can the patient actively participate in an intensive therapy program of at least 3 hrs of therapy 5 days a week? Yes 6. The potential for patient to make measurable gains while on inpatient rehab is excellent 7. Anticipated functional outcomes upon discharge from inpatients are: modified independent PT, modified independent OT, n/a SLP 8. Estimated rehab length of stay to reach the above functional goals is: 4-7days 9. Anticipated D/C setting: Home 10. Anticipated post D/C treatments: [place] therapy 11. Overall Rehab/Functional Prognosis: excellent  MD Signature: Meredith Staggers, MD, [place] [facility] 05/07/2019         Revision History Date/Time User Provider Type Action  05/07/2019 2:08 PM Meredith Staggers, MD Physician Sign  05/07/2019 2:03 PM Jhonnie Garner, [place] Re[facility] Admission Coordinator Share  View Details Report

## 2019-05-07 NOTE — Progress Notes (Signed)
Patient transferred with belongings.

## 2019-05-07 NOTE — Progress Notes (Signed)
Pt arrived to unit with Maudie Mercury at bed side, pt reports that daughter is her confirmed contact for rehab. Left paperwork on door and purple band. Pt is alert and oriented, able to make needs known, wound vac @125  on and functioning. Oriented pt to rehab, call bed within reach, bed alarm on and functioning.

## 2019-05-07 NOTE — Discharge Summary (Signed)
Physician Discharge Summary  Claudia [name] [id number] DOB: 19-Mar-1956 DOA: 05/03/2019  PCP: Tammi Sou, MD  Admit date: 05/03/2019 Discharge date: 05/07/2019  Admitted From: Home Disposition: CIR  Recommendations for Outpatient Follow-up:  1. Follow up with CIR provider at earliest convenience 2. Follow-up with orthopedics/Dr. Marlou Sa as an outpatient.  Activity/wound care as per orthopedic recommendations. 3. Follow up in ED if symptoms worsen or new appear   Home Health: No Equipment/Devices: None  Discharge Condition: Stable CODE STATUS: Full Diet recommendation: Heart healthy/carb modified  Brief/Interim Summary: 63 year old female with hypertension, hyperlipidemia, chronic pain from osteoarthritis, diabetes mellitus type 2 presented with fall at work and was found to have right hip fracture.  She underwent right total hip arthroplasty on 05/05/2019.  She will be discharged to CIR once bed is available.  Discharge Diagnoses:   Right hip fracture secondary to mechanical fall: Present on admission -Status post right total hip arthroplasty on 05/05/2019.  Activity/wound care as per orthopedic recommendations.  Continue wound VAC as per orthopedics.  Weightbearing as tolerated right lower extremity with a platform walker.  Nonweightbearing to right upper extremity; continue sling use. -Aspirin 81 mg twice a day for DVT prophylaxis -Outpatient follow-up with orthopedics  Essential hypertension  -Resume home regimen.  Outpatient follow-up  Diabetes mellitus type II uncontrolled with hyperglycemia -Continue home regimen.  Outpatient follow-up.  Carb modified diet    Discharge Instructions  Discharge Instructions    Diet - low sodium heart healthy   Complete by: As directed    Diet Carb Modified   Complete by: As directed    Increase activity slowly   Complete by: As directed      Allergies as of 05/07/2019      Reactions   Metformin And Related Nausea And  Vomiting, Other (See Comments)   GI upset      Medication List    STOP taking these medications   fluconazole 150 MG tablet Commonly known as: DIFLUCAN   Probiotic Daily Caps   traMADol 50 MG tablet Commonly known as: ULTRAM     TAKE these medications   aspirin 81 MG tablet Take 1 tablet (81 mg total) by mouth 2 (two) times daily. What changed: when to take this   canagliflozin 300 MG Tabs tablet Commonly known as: Invokana Take 1 tablet (300 mg total) by mouth daily.   diclofenac sodium 1 % Gel Commonly known as: Voltaren Apply 2 g topically at bedtime as needed (Knee). On knee's   docusate sodium 100 MG capsule Commonly known as: COLACE Take 1 capsule (100 mg total) by mouth 2 (two) times daily.   fluticasone 50 MCG/ACT nasal spray Commonly known as: FLONASE Place 2 sprays into both nostrils daily.   glipiZIDE 10 MG tablet Commonly known as: GLUCOTROL TAKE 1 TABLET BY MOUTH TWICE A DAY BEFORE MEALS What changed:   how much to take  how to take this  when to take this   HYDROcodone-acetaminophen 5-325 MG tablet Commonly known as: NORCO/VICODIN TAKE 1 TO 2 TABLETS BY MOUTH AT BEDTIME AS NEEDED FOR PAIN What changed:   how much to take  how to take this  when to take this  reasons to take this  additional instructions   lisinopril 5 MG tablet Commonly known as: ZESTRIL TAKE 1 TABLET BY MOUTH EVERY DAY   methocarbamol 500 MG tablet Commonly known as: ROBAXIN Take 1 tablet (500 mg total) by mouth every 6 (six) hours as needed for muscle spasms.  pioglitazone 30 MG tablet Commonly known as: ACTOS TAKE 1 TABLET BY MOUTH EVERY DAY   polyethylene glycol 17 g packet Commonly known as: MIRALAX / GLYCOLAX Take 17 g by mouth daily as needed for mild constipation.            Durable Medical Equipment  (From admission, onward)         Start     Ordered   05/06/19 1324  For home use only DME Walker platform  Once    Question:  Patient  needs a walker to treat with the following condition  Answer:  S/P hip replacement   05/06/19 1323         Follow-up Information    Marlou Sa, Tonna Corner, MD. Schedule an appointment as soon as possible for a visit in 1 week(s).   Specialty: Orthopedic Surgery Contact information: [address] [place] [address] [phone]          Allergies  Allergen Reactions  . Metformin And Related Nausea And Vomiting and Other (See Comments)    GI upset    Consultations:  Orthopedic surgery   Procedures/Studies: Dg Elbow Complete Right  Result Date: 05/03/2019 CLINICAL DATA:  Right elbow pain after fall. EXAM: RIGHT ELBOW - COMPLETE 3+ VIEW COMPARISON:  None. FINDINGS: Minimally displaced radial head fracture is noted. Abnormal anterior and posterior fat pad displacement is noted suggesting underlying joint effusion. IMPRESSION: Minimally displaced radial head fracture. Electronically Signed   By: Marijo Conception M.D.   On: 05/03/2019 12:15   Dg Foot Complete Right  Result Date: 05/03/2019 CLINICAL DATA:  Right foot pain after injury at work. EXAM: RIGHT FOOT COMPLETE - 3+ VIEW COMPARISON:  None. FINDINGS: No fracture or dislocation is noted. Mild degenerative changes seen involving first metatarsophalangeal joint. No soft tissue abnormality is noted. IMPRESSION: Mild osteoarthritis of the first metatarsophalangeal joint. No acute abnormality is noted. Electronically Signed   By: Marijo Conception M.D.   On: 05/03/2019 12:13   Dg C-arm 1-60 Min  Result Date: 05/04/2019 CLINICAL DATA:  Right hip arthroplasty EXAM: OPERATIVE RIGHT HIP (WITH PELVIS IF PERFORMED) 4 VIEWS TECHNIQUE: Fluoroscopic spot image(s) were submitted for interpretation post-operatively. COMPARISON:  None. FINDINGS: Intraoperative fluoroscopic images are provided. Interval right hip arthroplasty. IMPRESSION: Intraoperative localization. Electronically Signed   By: Kathreen Devoid   On: 05/04/2019 13:55   Dg  Hip Operative Unilat W Or W/o Pelvis Right  Result Date: 05/04/2019 CLINICAL DATA:  Right hip arthroplasty EXAM: OPERATIVE RIGHT HIP (WITH PELVIS IF PERFORMED) 4 VIEWS TECHNIQUE: Fluoroscopic spot image(s) were submitted for interpretation post-operatively. COMPARISON:  None. FINDINGS: Intraoperative fluoroscopic images are provided. Interval right hip arthroplasty. IMPRESSION: Intraoperative localization. Electronically Signed   By: Kathreen Devoid   On: 05/04/2019 13:55   Dg Hip Unilat With Pelvis 2-3 Views Right  Result Date: 05/03/2019 CLINICAL DATA:  Hip pain.  Mechanical fall EXAM: DG HIP (WITH OR WITHOUT PELVIS) 2-3V RIGHT COMPARISON:  None. FINDINGS: Fracture through the subcapital RIGHT femoral neck. Minimal angulation. Mild vertical migration. No dislocation. IMPRESSION: RIGHT subcapital femoral neck fracture. Electronically Signed   By: Suzy Bouchard M.D.   On: 05/03/2019 12:10       Subjective: Patient seen and examined at bedside.  She denies any overnight fever or vomiting or worsening pain.  Discharge Exam: Vitals:   05/07/19 0305 05/07/19 0737  BP: (!) 116/51 (!) 109/46  Pulse: 75 87  Resp: 16 16  Temp: 98.4 F (36.9 C)  98.7 F (37.1 C)  SpO2: 93% 98%    General: Pt is alert, awake, not in acute distress Cardiovascular: rate controlled, S1/S2 + Respiratory: bilateral decreased breath sounds at bases Abdominal: Soft, NT, ND, bowel sounds + Extremities: Trace lower extremity edema edema, no cyanosis    The results of significant diagnostics from this hospitalization (including imaging, microbiology, ancillary and laboratory) are listed below for reference.     Microbiology: Recent Results (from the past 240 hour(s))  SARS Coronavirus 2 [facility] order, Performed in [facility] hospital lab) Nasopharyngeal Nasopharyngeal Swab     Status: None   Collection Time: 05/03/19 12:19 PM   Specimen: Nasopharyngeal Swab  Result Value Ref Range Status   SARS Coronavirus  2 NEGATIVE NEGATIVE Final    Comment: (NOTE) If result is NEGATIVE SARS-CoV-2 target nucleic acids are NOT DETECTED. The SARS-CoV-2 RNA is generally detectable in upper and lower  respiratory specimens during the acute phase of infection. The lowest  concentration of SARS-CoV-2 viral copies this assay can detect is 250  copies / mL. A negative result does not preclude SARS-CoV-2 infection  and should not be used as the sole basis for treatment or other  patient management decisions.  A negative result may occur with  improper specimen collection / handling, submission of specimen other  than nasopharyngeal swab, presence of viral mutation(s) within the  areas targeted by this assay, and inadequate number of viral copies  (<250 copies / mL). A negative result must be combined with clinical  observations, patient history, and epidemiological information. If result is POSITIVE SARS-CoV-2 target nucleic acids are DETECTED. The SARS-CoV-2 RNA is generally detectable in upper and lower  respiratory specimens dur ing the acute phase of infection.  Positive  results are indicative of active infection with SARS-CoV-2.  Clinical  correlation with patient history and other diagnostic information is  necessary to determine patient infection status.  Positive results do  not rule out bacterial infection or co-infection with other viruses. If result is PRESUMPTIVE POSTIVE SARS-CoV-2 nucleic acids MAY BE PRESENT.   A presumptive positive result was obtained on the submitted specimen  and confirmed on repeat testing.  While 2019 novel coronavirus  (SARS-CoV-2) nucleic acids may be present in the submitted sample  additional confirmatory testing may be necessary for epidemiological  and / or clinical management purposes  to differentiate between  SARS-CoV-2 and other Sarbecovirus currently known to infect humans.  If clinically indicated additional testing with an alternate test  methodology  [phone]) is advised. The SARS-CoV-2 RNA is generally  detectable in upper and lower respiratory sp ecimens during the acute  phase of infection. The expected result is Negative. Fact Sheet for Patients:  StrictlyIdeas.no Fact Sheet for Healthcare Providers: BankingDealers.co.za This test is not yet approved or cleared by the [place] FDA and has been authorized for detection and/or diagnosis of SARS-CoV-2 by FDA under an Emergency Use Authorization (EUA).  This EUA will remain in effect (meaning this test can be used) for the duration of the COVID-19 declaration under Section 564(b)(1) of the Act, 21 U.S.C. section 360bbb-3(b)(1), unless the authorization is terminated or revoked sooner. Performed at [facility], [place] [address], [place], [place] [address]   Surgical pcr screen     Status: Abnormal   Collection Time: 05/03/19  8:25 PM   Specimen: Nasal Mucosa; Nasal Swab  Result Value Ref Range Status   MRSA, PCR NEGATIVE NEGATIVE Final   Staphylococcus aureus POSITIVE (A) NEGATIVE Final  Comment: (NOTE) The Xpert SA Assay (FDA approved for NASAL specimens in patients 7 years of age and older), is one component of a comprehensive surveillance program. It is not intended to diagnose infection nor to guide or monitor treatment. Performed at [facility], [place] [address] [place], [place] [address]      Labs: BNP (last 3 results) No results for input(s): BNP in the last 8760 hours. Basic Metabolic Panel: Recent Labs  Lab 05/03/19 1601 05/04/19 0348 05/05/19 0315 05/06/19 0227 05/07/19 0531  NA 139 138 138 138 137  K 3.6 3.3* 3.8 3.8 3.8  CL 108 107 108 104 101  CO2 21* 23 23 23 25   GLUCOSE 131* 136* 114* 135* 166*  BUN 10 8 12 12 14   CREATININE 0.72 0.82 0.96 0.95 0.84  CALCIUM 8.5* 8.4* 8.2* 8.3* 8.6*  MG  --   --  2.1 2.0 2.0  PHOS  --   --  2.9  --   --    Liver Function Tests: No results for  input(s): AST, ALT, ALKPHOS, BILITOT, PROT, ALBUMIN in the last 168 hours. No results for input(s): LIPASE, AMYLASE in the last 168 hours. No results for input(s): AMMONIA in the last 168 hours. CBC: Recent Labs  Lab 05/03/19 1601 05/04/19 0348 05/05/19 0315 05/06/19 0227 05/07/19 0531  WBC 13.2* 10.5 11.7* 11.7* 10.5  NEUTROABS 11.5*  --   --   --   --   HGB 13.2 13.1 11.2* 11.2* 11.4*  HCT 39.6 40.2 33.7* 34.6* 33.7*  MCV 93.6 91.8 91.6 93.5 91.8  PLT 188 245 218 197 213   Cardiac Enzymes: No results for input(s): CKTOTAL, CKMB, CKMBINDEX, TROPONINI in the last 168 hours. BNP: Invalid input(s): POCBNP CBG: Recent Labs  Lab 05/06/19 0637 05/06/19 1052 05/06/19 1639 05/06/19 2120 05/07/19 0644  GLUCAP 128* 214* 190* 182* 166*   D-Dimer No results for input(s): DDIMER in the last 72 hours. Hgb A1c No results for input(s): HGBA1C in the last 72 hours. Lipid Profile No results for input(s): CHOL, HDL, LDLCALC, TRIG, CHOLHDL, LDLDIRECT in the last 72 hours. Thyroid function studies No results for input(s): TSH, T4TOTAL, T3FREE, THYROIDAB in the last 72 hours.  Invalid input(s): FREET3 Anemia work up No results for input(s): VITAMINB12, FOLATE, FERRITIN, TIBC, IRON, RETICCTPCT in the last 72 hours. Urinalysis    Component Value Date/Time   BILIRUBINUR negative 04/16/2017 0810   PROTEINUR trace 04/16/2017 0810   UROBILINOGEN 0.2 04/16/2017 0810   NITRITE negative 04/16/2017 0810   LEUKOCYTESUR Negative 04/16/2017 0810   Sepsis Labs Invalid input(s): PROCALCITONIN,  WBC,  LACTICIDVEN Microbiology Recent Results (from the past 240 hour(s))  SARS Coronavirus 2 [facility] order, Performed in [facility] hospital lab) Nasopharyngeal Nasopharyngeal Swab     Status: None   Collection Time: 05/03/19 12:19 PM   Specimen: Nasopharyngeal Swab  Result Value Ref Range Status   SARS Coronavirus 2 NEGATIVE NEGATIVE Final    Comment: (NOTE) If result is NEGATIVE SARS-CoV-2  target nucleic acids are NOT DETECTED. The SARS-CoV-2 RNA is generally detectable in upper and lower  respiratory specimens during the acute phase of infection. The lowest  concentration of SARS-CoV-2 viral copies this assay can detect is 250  copies / mL. A negative result does not preclude SARS-CoV-2 infection  and should not be used as the sole basis for treatment or other  patient management decisions.  A negative result may occur with  improper specimen collection / handling, submission of  specimen other  than nasopharyngeal swab, presence of viral mutation(s) within the  areas targeted by this assay, and inadequate number of viral copies  (<250 copies / mL). A negative result must be combined with clinical  observations, patient history, and epidemiological information. If result is POSITIVE SARS-CoV-2 target nucleic acids are DETECTED. The SARS-CoV-2 RNA is generally detectable in upper and lower  respiratory specimens dur ing the acute phase of infection.  Positive  results are indicative of active infection with SARS-CoV-2.  Clinical  correlation with patient history and other diagnostic information is  necessary to determine patient infection status.  Positive results do  not rule out bacterial infection or co-infection with other viruses. If result is PRESUMPTIVE POSTIVE SARS-CoV-2 nucleic acids MAY BE PRESENT.   A presumptive positive result was obtained on the submitted specimen  and confirmed on repeat testing.  While 2019 novel coronavirus  (SARS-CoV-2) nucleic acids may be present in the submitted sample  additional confirmatory testing may be necessary for epidemiological  and / or clinical management purposes  to differentiate between  SARS-CoV-2 and other Sarbecovirus currently known to infect humans.  If clinically indicated additional testing with an alternate test  methodology [phone]) is advised. The SARS-CoV-2 RNA is generally  detectable in upper and lower  respiratory sp ecimens during the acute  phase of infection. The expected result is Negative. Fact Sheet for Patients:  StrictlyIdeas.no Fact Sheet for Healthcare Providers: BankingDealers.co.za This test is not yet approved or cleared by the [place] FDA and has been authorized for detection and/or diagnosis of SARS-CoV-2 by FDA under an Emergency Use Authorization (EUA).  This EUA will remain in effect (meaning this test can be used) for the duration of the COVID-19 declaration under Section 564(b)(1) of the Act, 21 U.S.C. section 360bbb-3(b)(1), unless the authorization is terminated or revoked sooner. Performed at [facility], [place] [address] [place], [place] [address]   Surgical pcr screen     Status: Abnormal   Collection Time: 05/03/19  8:25 PM   Specimen: Nasal Mucosa; Nasal Swab  Result Value Ref Range Status   MRSA, PCR NEGATIVE NEGATIVE Final   Staphylococcus aureus POSITIVE (A) NEGATIVE Final    Comment: (NOTE) The Xpert SA Assay (FDA approved for NASAL specimens in patients 64 years of age and older), is one component of a comprehensive surveillance program. It is not intended to diagnose infection nor to guide or monitor treatment. Performed at [facility], [place] [address] [place], [place] [address]      Time coordinating discharge: 35 minutes  SIGNED:   Aline August, MD  [facility] 05/07/2019, 10:14 AM

## 2019-05-07 NOTE — Progress Notes (Signed)
Inpatient Diabetes Program Recommendations  AACE/ADA: New Consensus Statement on Inpatient Glycemic Control (2015)  Target Ranges:  Prepandial:   less than 140 mg/dL      Peak postprandial:   less than 180 mg/dL (1-2 hours)      Critically ill patients:  140 - 180 mg/dL   Lab Results  Component Value Date   GLUCAP 229 ([name]) 05/07/2019   HGBA1C 7.9 ([name]) 01/12/2019    Review of Glycemic Control Results for Stults, Claudia [name] "Claudia [name]" (MRN [id number]) as of 05/07/2019 15:41  Ref. Range 05/06/2019 16:39 05/06/2019 21:20 05/07/2019 06:44 05/07/2019 11:59  Glucose-Capillary Latest Ref Range: 70 - 99 mg/dL 190 ([name]) 182 ([name]) 166 ([name]) 229 ([name])   Diabetes history: Type 2 DM Outpatient Diabetes medications: Invokana 300 mg QD, Glipizide 10 mg BID, Actos 30 mg QD Current orders for Inpatient Glycemic control: Novolog 0-15 units TID, Novolog 0-5 units QHS  Inpatient Diabetes Program Recommendations:    Spoke with patient regarding outpatient DM management. Patient reports taking medications, however, "splurges on Dr Pepper once per day." Reviewed patient's current A1c of 7.9%. Explained what a A1c is and what it measures. Also reviewed goal A1c with patient, importance of good glucose control @ home, and blood sugar goals. Reviewed patho of DM, need for better control, impact of glucose to infection, vascular changes and other commorbidites. Patient is requesting a new meter. Blood glucose meter kit (inlcudes lancets and strips) ( #[id number]). Reports checking 2-3 times per day and they range from 150-170 mg/dL. Admits to drinking an 8 oz Dr Pepper a day for the caffeine. Reviewed alternatives and reminded patient that diet drinks also contain caffeine. Patient accepting and willing to make changes.  Patient plans to follow up with PCP. Has no further questions.  Secure chat sent to MD for meter.   Thanks, Lauren McDaniel, MSN, RNC-OB Diabetes Coordinator [phone] (8a-5p)    

## 2019-05-08 ENCOUNTER — Inpatient Hospital Stay (HOSPITAL_COMMUNITY): Payer: PRIVATE HEALTH INSURANCE

## 2019-05-08 ENCOUNTER — Inpatient Hospital Stay (HOSPITAL_COMMUNITY): Payer: PRIVATE HEALTH INSURANCE | Admitting: Rehabilitation

## 2019-05-08 DIAGNOSIS — I82432 Acute embolism and thrombosis of left popliteal vein: Secondary | ICD-10-CM

## 2019-05-08 DIAGNOSIS — S52101S Unspecified fracture of upper end of right radius, sequela: Secondary | ICD-10-CM

## 2019-05-08 LAB — GLUCOSE, CAPILLARY
Glucose-Capillary: 164 mg/dL — ABNORMAL HIGH (ref 70–99)
Glucose-Capillary: 128 mg/dL — ABNORMAL HIGH (ref 70–99)
Glucose-Capillary: 176 mg/dL — ABNORMAL HIGH (ref 70–99)
Glucose-Capillary: 220 mg/dL — ABNORMAL HIGH (ref 70–99)

## 2019-05-08 MED ORDER — RIVAROXABAN 15 MG PO TABS
15.0000 mg | ORAL_TABLET | Freq: Two times a day (BID) | ORAL | Status: DC
  Administered 2019-05-08 – 2019-05-13 (×10): 15 mg via ORAL
  Filled 2019-05-08 (×11): qty 1
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
## 2019-05-08 NOTE — Progress Notes (Signed)
Occupational Therapy Session Note  Patient Details  Name: Claudia [name] MRN: [id number] Date of Birth: Feb 11, 1956  Today's Date: 05/08/2019 OT Individual Time: 1400-1500 OT Individual Time Calculation (min): 60 min    Short Term Goals: Week 1:  OT Short Term Goal 1 (Week 1): STG=LTG d/t ELOS  Skilled Therapeutic Interventions/Progress Updates:  1:1. Pt received in recliner reporting need to toilet and no pain. Pt ambulates with PFRW and MIN A for initial sit to stand and CGA to transfer onto toilet. Pt completes components of toileting with CGA in standing. Pt completes car and TTB transfer from ambulatory level and MIN A. Pt stands for 6 frames of Wii bowling for standing tolerance required for BADL/IADLs. Exited session with pt seated again on [facility] with NT in room finishing supervision of toileting  Therapy Documentation Precautions:  Precautions Precautions: Fall Required Braces or Orthoses: Sling Restrictions Weight Bearing Restrictions: Yes RUE Weight Bearing: Non weight bearing RLE Weight Bearing: Weight bearing as tolerated General:   Vital Signs: Therapy Vitals Temp: 98.5 F (36.9 C) Temp Source: Oral Pulse Rate: 80 Resp: 17 BP: (!) 120/56 Patient Position (if appropriate): Sitting Oxygen Therapy SpO2: 98 % O2 Device: Room Air Pain: Pain Assessment Pain Scale: 0-10 Pain Score: 7  Pain Type: Surgical pain Pain Location: Leg Pain Orientation: Right Pain Descriptors / Indicators: Aching Pain Onset: On-going Pain Intervention(s): RN made aware ADL:   Vision   Perception    Praxis Praxis: Intact Exercises:   Other Treatments:     Therapy/Group: Individual Therapy  Tonny Branch 05/08/2019, 3:01 PM

## 2019-05-08 NOTE — Progress Notes (Signed)
[facility] PHYSICAL MEDICINE & REHABILITATION PROGRESS NOTE   Subjective/Complaints: Had a reasonable night. Pain controlled. Daughter concerned about clot  ROS: Patient denies fever, rash, sore throat, blurred vision, nausea, vomiting, diarrhea, cough, shortness of breath or chest pain,  headache, or mood change.    Objective:   Vas [place] Lower Extremity Venous (dvt)  Result Date: 05/08/2019  Lower Venous Study Indications: Swelling.  Risk Factors: Surgery right hip repair. Limitations: Body habitus. Comparison Study: No previous study available Performing Technologist: Toma Copier RVS  Examination Guidelines: A complete evaluation includes B-mode imaging, spectral Doppler, color Doppler, and power Doppler as needed of all accessible portions of each vessel. Bilateral testing is considered an integral part of a complete examination. Limited examinations for reoccurring indications may be performed as noted.  +---------+---------------+---------+-----------+----------+--------------+ RIGHT    CompressibilityPhasicitySpontaneityPropertiesThrombus Aging +---------+---------------+---------+-----------+----------+--------------+ CFV      Partial        Yes      Yes                                 +---------+---------------+---------+-----------+----------+--------------+ SFJ      Full                                                        +---------+---------------+---------+-----------+----------+--------------+ FV Prox  Full           Yes      Yes                                 +---------+---------------+---------+-----------+----------+--------------+ FV Mid   Full                                                        +---------+---------------+---------+-----------+----------+--------------+ FV DistalFull           Yes      Yes                                 +---------+---------------+---------+-----------+----------+--------------+ PFV      Full            Yes      Yes                                 +---------+---------------+---------+-----------+----------+--------------+ POP      Full           Yes      Yes                                 +---------+---------------+---------+-----------+----------+--------------+ PTV      Full                                                        +---------+---------------+---------+-----------+----------+--------------+  PERO     Full                                                        +---------+---------------+---------+-----------+----------+--------------+   +---------+---------------+---------+-----------+----------+------------------+ LEFT     CompressibilityPhasicitySpontaneityPropertiesThrombus Aging     +---------+---------------+---------+-----------+----------+------------------+ CFV      Full                                                            +---------+---------------+---------+-----------+----------+------------------+ SFJ      Full                                                            +---------+---------------+---------+-----------+----------+------------------+ FV Prox  Full                                                            +---------+---------------+---------+-----------+----------+------------------+ FV Mid   Full                                                            +---------+---------------+---------+-----------+----------+------------------+ FV DistalFull                                                            +---------+---------------+---------+-----------+----------+------------------+ PFV      Full                                                            +---------+---------------+---------+-----------+----------+------------------+ POP      Partial                                      Acute proximal                                                           popliteal           +---------+---------------+---------+-----------+----------+------------------+ PTV      Full                                                            +---------+---------------+---------+-----------+----------+------------------+  PERO     Full                                         Difficult to image +---------+---------------+---------+-----------+----------+------------------+     Summary: Right: There is no evidence of deep vein thrombosis in the lower extremity. No cystic structure found in the popliteal fossa. Left: Findings consistent with acute deep vein thrombosis involving the left popliteal vein. No cystic structure found in the popliteal fossa.  *See table(s) above for measurements and observations. Electronically signed by Curt Jews MD on 05/08/2019 at 3:58:16 AM.    Final    Recent Labs    05/06/19 0227 05/07/19 0531  WBC 11.7* 10.5  HGB 11.2* 11.4*  HCT 34.6* 33.7*  PLT 197 213   Recent Labs    05/06/19 0227 05/07/19 0531  NA 138 137  K 3.8 3.8  CL 104 101  CO2 23 25  GLUCOSE 135* 166*  BUN 12 14  CREATININE 0.95 0.84  CALCIUM 8.3* 8.6*    Intake/Output Summary (Last 24 hours) at 05/08/2019 0824 Last data filed at 05/08/2019 0600 Gross per 24 hour  Intake 236 ml  Output 0 ml  Net 236 ml     Physical Exam: Vital Signs Blood pressure 130/69, pulse 79, temperature 97.8 F (36.6 C), temperature source Oral, resp. rate 18, SpO2 100 %. Constitutional: No distress . Vital signs reviewed. HEENT: EOMI, oral membranes moist Neck: supple Cardiovascular: RRR without murmur. No JVD    Respiratory: CTA Bilaterally without wheezes or rales. Normal effort    GI: BS +, non-tender, non-distended   Musculoskeletal:  Comments: RUE in sling. Pt able to extend/flex elbow, mild pain with supination/pronation of arm. RLE with tenderness and proximal swelling. Right forefoot remains tender with mild swelling and bruising. Neurological: She is alertand oriented  to person, place, and time. She hasnormal reflexes. Nocranial nerve deficit. Normal insight, memory, language. LUE and LLE 5/5. RUE--moves all muscles, limited by sling/pain. RLE: 1+ HF, KE (pain), 4-/5 ADF/PF (pain) Skin: . Right lower extremity incision site is dressed and remains in wound VAC in place. Right foot with bruising Psychiatric: pleasant     Assessment/Plan: 1. Functional deficits secondary to right subcapital hip fx, right radial head fx which require 3+ hours per day of interdisciplinary therapy in a comprehensive inpatient rehab setting.  Physiatrist is providing close team supervision and 24 hour management of active medical problems listed below.  Physiatrist and rehab team continue to assess barriers to discharge/monitor patient progress toward functional and medical goals  Care Tool:  Bathing              Bathing assist       Upper Body Dressing/Undressing Upper body dressing        Upper body assist      Lower Body Dressing/Undressing Lower body dressing            Lower body assist       Toileting Toileting    Toileting assist Assist for toileting: Moderate Assistance - Patient 50 - 74%(bedpan)     Transfers Chair/bed transfer  Transfers assist           Locomotion Ambulation   Ambulation assist              Walk 10 feet activity   Assist  Walk 50 feet activity   Assist           Walk 150 feet activity   Assist           Walk 10 feet on uneven surface  activity   Assist           Wheelchair     Assist               Wheelchair 50 feet with 2 turns activity    Assist            Wheelchair 150 feet activity     Assist          Blood pressure 130/69, pulse 79, temperature 97.8 F (36.6 C), temperature source Oral, resp. rate 18, SpO2 100 %.  Medical Problem List and Plan: 1.Decreased functional mobilitysecondary to right subcapital  femoral neck fracture. S/Pright total hip arthroplasty anterior approach 05/04/2019, right radial head fx -Patient is beginning CIR therapies today including PT and OT  -WBATRLE -RUE: nonweightbearing- may weight bear through elbow with platform walker for gait however 2. Antithrombotics: -DVT/anticoagulation:interestingly a popliteal thrombus LLE   -initiated xarelto last night, second dose this morning. Can mobilize with therapy today. Spoke with daughter re DVT  -antiplatelet therapy: Aspirin 81 mg twice daily 3. Pain Management/chronic pain syndrome:Naprosyn 250 mg twice daily, oxycodone/hydrocodone and Robaxin as needed 4. Mood:Provide emotional support -antipsychotic agents: N/A 5. Neuropsych: This patientiscapable of making decisions on herown behalf. 6. Skin/Wound Care:prevana wound vac x 5 days then change to aquacel dressing to right hip. -continue local care 7. Fluids/Electrolytes/Nutrition:Routine in and outs with follow-up chemistries 8. PCR screening positive. Completing 5-day course of Bactroban. 9. Diabetes mellitus. Latest hemoglobin A1c 7.9. SSI. Glucotrol 10 mg twice daily, Actos 30 mg daily  -follow for pattern 10. Hypertension. Lisinopril 5 mg daily 11. Constipation. Laxative assistance    LOS: 1 days A FACE TO FACE EVALUATION WAS PERFORMED  Meredith Staggers 05/08/2019, 8:24 AM

## 2019-05-08 NOTE — Discharge Instructions (Addendum)
Inpatient Rehab Discharge Instructions  OVETTA SPEISER Discharge date and time: No discharge date for patient encounter.   Activities/Precautions/ Functional Status: Activity: Weightbearing as tolerated right lower extremity.  Nonweightbearing right upper extremity with platform walker Diet: regular diet Wound Care: keep wound clean and dry Functional status:  ___ No restrictions     ___ Walk up steps independently ___ 24/7 supervision/assistance   ___ Walk up steps with assistance ___ Intermittent supervision/assistance  ___ Bathe/dress independently ___ Walk with walker     _x__ Bathe/dress with assistance ___ Walk Independently    ___ Shower independently ___ Walk with assistance    ___ Shower with assistance ___ No alcohol     ___ Return to work/school ________  COMMUNITY REFERRALS UPON DISCHARGE:   Home Health:   PT     OT    Agency:  [facility] Phone:  [phone] Medical Equipment/Items Ordered:  Right platform rolling walker; tub transfer bench; bedside commode; 20"x18" wheelchair with basic cushion  Agency/Supplier:  [employer] ordered this for you.  Special Instructions: No driving smoking or alcohol   My questions have been answered and I understand these instructions. I will adhere to these goals and the provided educational materials after my discharge from the hospital.  Patient/Caregiver Signature _______________________________ Date __________  Clinician Signature _______________________________________ Date __________  Please bring this form and your medication list with you to all your follow-up doctor's appointments. Information on my medicine - XARELTO (rivaroxaban)  This medication education was reviewed with me or my healthcare representative as part of my discharge preparation.  WHY WAS XARELTO PRESCRIBED FOR YOU? Xarelto was prescribed to treat blood clots that may have been found in the veins of your legs (deep vein  thrombosis) or in your lungs (pulmonary embolism) and to reduce the risk of them occurring again.  What do you need to know about Xarelto? The starting dose is one 15 mg tablet taken TWICE daily with food for the FIRST 21 DAYS then on (enter date)  05/28/2019  the dose is changed to one 20 mg tablet taken ONCE A DAY with your evening meal.  DO NOT stop taking Xarelto without talking to the health care provider who prescribed the medication.  Refill your prescription for 20 mg tablets before you run out.  After discharge, you should have regular check-up appointments with your healthcare provider that is prescribing your Xarelto.  In the future your dose may need to be changed if your kidney function changes by a significant amount.  What do you do if you miss a dose? If you are taking Xarelto TWICE DAILY and you miss a dose, take it as soon as you remember. You may take two 15 mg tablets (total 30 mg) at the same time then resume your regularly scheduled 15 mg twice daily the next day.  If you are taking Xarelto ONCE DAILY and you miss a dose, take it as soon as you remember on the same day then continue your regularly scheduled once daily regimen the next day. Do not take two doses of Xarelto at the same time.   Important Safety Information Xarelto is a blood thinner medicine that can cause bleeding. You should call your healthcare provider right away if you experience any of the following: ? Bleeding from an injury or your nose that does not stop. ? Unusual colored urine (red or dark brown) or unusual colored stools (red or black). ? Unusual bruising for unknown reasons. ? A  serious fall or if you hit your head (even if there is no bleeding).  Some medicines may interact with Xarelto and might increase your risk of bleeding while on Xarelto. To help avoid this, consult your healthcare provider or pharmacist prior to using any new prescription or non-prescription medications, including  herbals, vitamins, non-steroidal anti-inflammatory drugs (NSAIDs) and supplements.  This website has more information on Xarelto: [email or web address]

## 2019-05-09 DIAGNOSIS — K5901 Slow transit constipation: Secondary | ICD-10-CM

## 2019-05-09 LAB — GLUCOSE, CAPILLARY
Glucose-Capillary: 166 mg/dL — ABNORMAL HIGH (ref 70–99)
Glucose-Capillary: 200 mg/dL — ABNORMAL HIGH (ref 70–99)
Glucose-Capillary: 158 mg/dL — ABNORMAL HIGH (ref 70–99)
Glucose-Capillary: 208 mg/dL — ABNORMAL HIGH (ref 70–99)

## 2019-05-09 MED ORDER — SENNOSIDES-DOCUSATE SODIUM 8.6-50 MG PO TABS
2.0000 | ORAL_TABLET | Freq: Every day | ORAL | Status: DC
  Administered 2019-05-09 – 2019-05-12 (×4): 2 via ORAL
  Filled 2019-05-09 (×4): qty 2

## 2019-05-09 NOTE — Progress Notes (Addendum)
Patient requiring disimpaction and soap suds enema this morning. Patient had large results with this. No further nausea noted. Wound vac discontinued per order. Incision pink-no staples/sutures present. Steri strips noted at top and bottom. Small amount of bloody drainage noted on area of incision at crease/groin area. Aquacel dressing applied as ordered. Paient tolerated well. Continue with plan of care.

## 2019-05-09 NOTE — Plan of Care (Signed)
  Problem: Education: Goal: Required Educational Video(s) Outcome: Progressing   Problem: Clinical Measurements: Goal: Ability to maintain clinical measurements within normal limits will improve Outcome: Progressing Goal: Postoperative complications will be avoided or minimized Outcome: Progressing   Problem: Skin Integrity: Goal: Demonstration of wound healing without infection will improve Outcome: Progressing   Problem: Consults Goal: RH GENERAL PATIENT EDUCATION Description: See Patient Education module for education specifics. Outcome: Progressing   Problem: RH BOWEL ELIMINATION Goal: RH STG MANAGE BOWEL WITH ASSISTANCE Description: STG Manage Bowel with min Assistance. Outcome: Progressing Goal: RH STG MANAGE BOWEL W/MEDICATION W/ASSISTANCE Description: STG Manage Bowel with Medication with min Assistance. Outcome: Progressing   Problem: RH BLADDER ELIMINATION Goal: RH STG MANAGE BLADDER WITH ASSISTANCE Description: STG Manage Bladder With min Assistance Outcome: Progressing   Problem: RH SKIN INTEGRITY Goal: RH STG SKIN FREE OF INFECTION/BREAKDOWN Description: Skin will be free of infection/breakdown with min assistance,  Outcome: Progressing Goal: RH STG MAINTAIN SKIN INTEGRITY WITH ASSISTANCE Description: STG Maintain Skin Integrity With min Assistance. Outcome: Progressing Goal: RH STG ABLE TO PERFORM INCISION/WOUND CARE W/ASSISTANCE Description: STG Able To Perform Incision/Wound Care With min Assistance. Outcome: Progressing   Problem: RH SAFETY Goal: RH STG ADHERE TO SAFETY PRECAUTIONS W/ASSISTANCE/DEVICE Description: STG Adhere to Safety Precautions With min Assistance/Device. Outcome: Progressing Goal: RH STG DECREASED RISK OF FALL WITH ASSISTANCE Description: STG Decreased Risk of Fall With min Assistance. Outcome: Progressing   Problem: RH PAIN MANAGEMENT Goal: RH STG PAIN MANAGED AT OR BELOW PT'S PAIN GOAL Description: Less than 3 out of  10 Outcome: Progressing

## 2019-05-09 NOTE — Progress Notes (Signed)
[facility] PHYSICAL MEDICINE & REHABILITATION PROGRESS NOTE   Subjective/Complaints: Overall doing well. Is constipated however.  RN noted she was impacted, too later after I rounded  ROS: Patient denies fever, rash, sore throat, blurred vision, nausea, vomiting, diarrhea, cough, shortness of breath or chest pain,   headache, or mood change.    Objective:   Vas [place] Lower Extremity Venous (dvt)  Result Date: 05/08/2019  Lower Venous Study Indications: Swelling.  Risk Factors: Surgery right hip repair. Limitations: Body habitus. Comparison Study: No previous study available Performing Technologist: Toma Copier RVS  Examination Guidelines: A complete evaluation includes B-mode imaging, spectral Doppler, color Doppler, and power Doppler as needed of all accessible portions of each vessel. Bilateral testing is considered an integral part of a complete examination. Limited examinations for reoccurring indications may be performed as noted.  +---------+---------------+---------+-----------+----------+--------------+ RIGHT    CompressibilityPhasicitySpontaneityPropertiesThrombus Aging +---------+---------------+---------+-----------+----------+--------------+ CFV      Partial        Yes      Yes                                 +---------+---------------+---------+-----------+----------+--------------+ SFJ      Full                                                        +---------+---------------+---------+-----------+----------+--------------+ FV Prox  Full           Yes      Yes                                 +---------+---------------+---------+-----------+----------+--------------+ FV Mid   Full                                                        +---------+---------------+---------+-----------+----------+--------------+ FV DistalFull           Yes      Yes                                  +---------+---------------+---------+-----------+----------+--------------+ PFV      Full           Yes      Yes                                 +---------+---------------+---------+-----------+----------+--------------+ POP      Full           Yes      Yes                                 +---------+---------------+---------+-----------+----------+--------------+ PTV      Full                                                        +---------+---------------+---------+-----------+----------+--------------+  PERO     Full                                                        +---------+---------------+---------+-----------+----------+--------------+   +---------+---------------+---------+-----------+----------+------------------+ LEFT     CompressibilityPhasicitySpontaneityPropertiesThrombus Aging     +---------+---------------+---------+-----------+----------+------------------+ CFV      Full                                                            +---------+---------------+---------+-----------+----------+------------------+ SFJ      Full                                                            +---------+---------------+---------+-----------+----------+------------------+ FV Prox  Full                                                            +---------+---------------+---------+-----------+----------+------------------+ FV Mid   Full                                                            +---------+---------------+---------+-----------+----------+------------------+ FV DistalFull                                                            +---------+---------------+---------+-----------+----------+------------------+ PFV      Full                                                            +---------+---------------+---------+-----------+----------+------------------+ POP      Partial                                       Acute proximal                                                           popliteal          +---------+---------------+---------+-----------+----------+------------------+ PTV      Full                                                            +---------+---------------+---------+-----------+----------+------------------+  PERO     Full                                         Difficult to image +---------+---------------+---------+-----------+----------+------------------+     Summary: Right: There is no evidence of deep vein thrombosis in the lower extremity. No cystic structure found in the popliteal fossa. Left: Findings consistent with acute deep vein thrombosis involving the left popliteal vein. No cystic structure found in the popliteal fossa.  *See table(s) above for measurements and observations. Electronically signed by Curt Jews MD on 05/08/2019 at 3:58:16 AM.    Final    Recent Labs    05/07/19 0531  WBC 10.5  HGB 11.4*  HCT 33.7*  PLT 213   Recent Labs    05/07/19 0531  NA 137  K 3.8  CL 101  CO2 25  GLUCOSE 166*  BUN 14  CREATININE 0.84  CALCIUM 8.6*    Intake/Output Summary (Last 24 hours) at 05/09/2019 0946 Last data filed at 05/09/2019 0732 Gross per 24 hour  Intake 720 ml  Output 0 ml  Net 720 ml     Physical Exam: Vital Signs Blood pressure (!) 116/51, pulse 71, temperature 98.6 F (37 C), temperature source Oral, resp. rate 18, SpO2 100 %. Constitutional: No distress . Vital signs reviewed. HEENT: EOMI, oral membranes moist Neck: supple Cardiovascular: RRR without murmur. No JVD    Respiratory: CTA Bilaterally without wheezes or rales. Normal effort    GI: BS +, non-tender, non-distended  Musculoskeletal:  Comments: RUE in sling. Pt able to extend/flex elbow, mild pain with supination/pronation of arm. RLE with tenderness and proximal swelling. Right forefoot remains tender with mild swelling and bruising. Neurological: She is  alertand oriented to person, place, and time. She hasnormal reflexes. Nocranial nerve deficit. Normal insight, memory, language. LUE and LLE 5/5. RUE--moves all muscles, limited by sling/pain. RLE: 1+ HF, KE (pain), 4-/5 ADF/PF (pain) Skin: Right lower extremity incision site is dressed and remains in wound VAC in place. Right foot bruised/swollen Psychiatric: pleasant     Assessment/Plan: 1. Functional deficits secondary to right subcapital hip fx, right radial head fx which require 3+ hours per day of interdisciplinary therapy in a comprehensive inpatient rehab setting.  Physiatrist is providing close team supervision and 24 hour management of active medical problems listed below.  Physiatrist and rehab team continue to assess barriers to discharge/monitor patient progress toward functional and medical goals  Care Tool:  Bathing    Body parts bathed by patient: Right arm, Left arm, Chest, Abdomen, Front perineal area, Right upper leg, Left upper leg, Face   Body parts bathed by helper: Buttocks, Right lower leg, Left lower leg     Bathing assist Assist Level: Moderate Assistance - Patient 50 - 74%     Upper Body Dressing/Undressing Upper body dressing   What is the patient wearing?: Hospital gown only    Upper body assist Assist Level: Minimal Assistance - Patient > 75%    Lower Body Dressing/Undressing Lower body dressing      What is the patient wearing?: Pants     Lower body assist Assist for lower body dressing: Moderate Assistance - Patient 50 - 74%     Toileting Toileting    Toileting assist Assist for toileting: Minimal Assistance - Patient > 75%     Transfers Chair/bed transfer  Transfers assist  Chair/bed transfer assist level: Contact Guard/Touching assist     Locomotion Ambulation   Ambulation assist      Assist level: Minimal Assistance - Patient > 75% Assistive device: Walker-platform Max distance: 70   Walk 10 feet  activity   Assist     Assist level: Minimal Assistance - Patient > 75% Assistive device: Walker-platform   Walk 50 feet activity   Assist    Assist level: Minimal Assistance - Patient > 75% Assistive device: Walker-platform    Walk 150 feet activity   Assist Walk 150 feet activity did not occur: Safety/medical concerns         Walk 10 feet on uneven surface  activity   Assist Walk 10 feet on uneven surfaces activity did not occur: Safety/medical concerns         Wheelchair     Assist Will patient use wheelchair at discharge?: Yes Type of Wheelchair: Manual    Wheelchair assist level: (difficult due to chair height) Max wheelchair distance: 50    Wheelchair 50 feet with 2 turns activity    Assist        Assist Level: Minimal Assistance - Patient > 75%   Wheelchair 150 feet activity     Assist  Wheelchair 150 feet activity did not occur: Safety/medical concerns       Blood pressure (!) 116/51, pulse 71, temperature 98.6 F (37 C), temperature source Oral, resp. rate 18, SpO2 100 %.  Medical Problem List and Plan: 1.Decreased functional mobilitysecondary to right subcapital femoral neck fracture. S/Pright total hip arthroplasty anterior approach 05/04/2019, right radial head fx -Patient is beginning CIR therapies today including PT and OT  -WBATRLE -RUE: nonweightbearing- may weight bear through elbow with platform walker for gait however 2. Antithrombotics: -DVT/anticoagulation:interestingly a popliteal thrombus LLE   -initiated xarelto last night, second dose this morning. Can mobilize with therapy today. Spoke with daughter re DVT  -antiplatelet therapy: Aspirin 81 mg twice daily 3. Pain Management/chronic pain syndrome:Naprosyn 250 mg twice daily, oxycodone/hydrocodone and Robaxin as needed 4. Mood:Provide emotional support -antipsychotic agents: N/A 5.  Neuropsych: This patientiscapable of making decisions on herown behalf. 6. Skin/Wound Care:remove prevana wound vac today  - aquacel dressing to right hip incision  -pt may shower -continue local care 7. Fluids/Electrolytes/Nutrition:Routine in and outs with follow-up chemistries 8. PCR screening positive. Completing 5-day course of Bactroban. 9. Diabetes mellitus. Latest hemoglobin A1c 7.9. SSI. Glucotrol 10 mg twice daily, Actos 30 mg daily  -follow for pattern 10. Hypertension. Lisinopril 5 mg daily 11. Constipation. SSE this moring  -augment regular regimen    LOS: 2 days A FACE TO FACE EVALUATION WAS PERFORMED  Meredith Staggers 05/09/2019, 9:46 AM

## 2019-05-10 ENCOUNTER — Inpatient Hospital Stay (HOSPITAL_COMMUNITY): Payer: PRIVATE HEALTH INSURANCE | Admitting: Physical Therapy

## 2019-05-10 ENCOUNTER — Inpatient Hospital Stay (HOSPITAL_COMMUNITY): Payer: PRIVATE HEALTH INSURANCE | Admitting: Occupational Therapy

## 2019-05-10 DIAGNOSIS — S5291XA Unspecified fracture of right forearm, initial encounter for closed fracture: Secondary | ICD-10-CM | POA: Diagnosis present

## 2019-05-10 LAB — GLUCOSE, CAPILLARY
Glucose-Capillary: 203 mg/dL — ABNORMAL HIGH (ref 70–99)
Glucose-Capillary: 248 mg/dL — ABNORMAL HIGH (ref 70–99)
Glucose-Capillary: 179 mg/dL — ABNORMAL HIGH (ref 70–99)
Glucose-Capillary: 172 mg/dL — ABNORMAL HIGH (ref 70–99)

## 2019-05-10 LAB — CBC WITH DIFFERENTIAL/PLATELET
Abs Immature Granulocytes: 0.22 10*3/uL — ABNORMAL HIGH (ref 0.00–0.07)
Basophils Absolute: 0.1 10*3/uL (ref 0.0–0.1)
Basophils Relative: 1 %
Eosinophils Absolute: 0.4 10*3/uL (ref 0.0–0.5)
Eosinophils Relative: 5 %
HCT: 34.2 % — ABNORMAL LOW (ref 36.0–46.0)
Hemoglobin: 11.3 g/dL — ABNORMAL LOW (ref 12.0–15.0)
Immature Granulocytes: 3 %
Lymphocytes Relative: 18 %
Lymphs Abs: 1.5 10*3/uL (ref 0.7–4.0)
MCH: 30.1 pg (ref 26.0–34.0)
MCHC: 33 g/dL (ref 30.0–36.0)
MCV: 91.2 fL (ref 80.0–100.0)
Monocytes Absolute: 0.7 10*3/uL (ref 0.1–1.0)
Monocytes Relative: 8 %
Neutro Abs: 5.2 10*3/uL (ref 1.7–7.7)
Neutrophils Relative %: 65 %
Platelets: 267 10*3/uL (ref 150–400)
RBC: 3.75 MIL/uL — ABNORMAL LOW (ref 3.87–5.11)
RDW: 14 % (ref 11.5–15.5)
WBC: 8 10*3/uL (ref 4.0–10.5)
nRBC: 0 % (ref 0.0–0.2)

## 2019-05-10 LAB — COMPREHENSIVE METABOLIC PANEL
ALT: 17 U/L (ref 0–44)
AST: 16 U/L (ref 15–41)
Albumin: 2.5 g/dL — ABNORMAL LOW (ref 3.5–5.0)
Alkaline Phosphatase: 95 U/L (ref 38–126)
Anion gap: 9 (ref 5–15)
BUN: 9 mg/dL (ref 8–23)
CO2: 27 mmol/L (ref 22–32)
Calcium: 8.6 mg/dL — ABNORMAL LOW (ref 8.9–10.3)
Chloride: 101 mmol/L (ref 98–111)
Creatinine, Ser: 0.7 mg/dL (ref 0.44–1.00)
GFR calc Af Amer: >60 mL/min (ref >60)
GFR calc non Af Amer: >60 mL/min (ref >60)
Glucose, Bld: 194 mg/dL — ABNORMAL HIGH (ref 70–99)
Potassium: 3.9 mmol/L (ref 3.5–5.1)
Sodium: 137 mmol/L (ref 135–145)
Total Bilirubin: 0.5 mg/dL (ref 0.3–1.2)
Total Protein: 5.9 g/dL — ABNORMAL LOW (ref 6.5–8.1)

## 2019-05-10 MED ORDER — PIOGLITAZONE HCL 15 MG PO TABS
45.0000 mg | ORAL_TABLET | Freq: Every day | ORAL | Status: DC
  Administered 2019-05-11 – 2019-05-13 (×3): 45 mg via ORAL
  Filled 2019-05-10 (×3): qty 3

## 2019-05-10 MED ORDER — ASPIRIN EC 81 MG PO TBEC
81.0000 mg | DELAYED_RELEASE_TABLET | Freq: Every day | ORAL | Status: DC
  Administered 2019-05-11 – 2019-05-13 (×3): 81 mg via ORAL
  Filled 2019-05-10 (×4): qty 1

## 2019-05-10 MED ORDER — NAPROXEN 250 MG PO TABS: 250.0000 mg | ORAL_TABLET | Freq: Two times a day (BID) | ORAL | Status: DC | PRN

## 2019-05-10 NOTE — Progress Notes (Signed)
Inpatient Rehabilitation  Patient information reviewed and entered into eRehab system by Dontavian Marchi M. Onesimo Lingard, M.A., CCC/SLP, PPS Coordinator.  Information including medical coding, functional ability and quality indicators will be reviewed and updated through discharge.    

## 2019-05-10 NOTE — Progress Notes (Addendum)
Occupational Therapy Session Note  Patient Details  Name: Claudia [name] MRN: [id number] Date of Birth: August 24, 1955  Today's Date: 05/10/2019 OT Individual Time: 0930-1040 and 1400-1456 OT Individual Time Calculation (min): 70 min and 56 min   Short Term Goals: Week 1:  OT Short Term Goal 1 (Week 1): STG=LTG d/t ELOS  Skilled Therapeutic Interventions/Progress Updates:    Session One: Pt seen for OT ADL bathing/dressing session. Pt sitting up in recliner upon arrival, agreeable to tx session and requesting to shower. Pt reports only "stiffness" at rest, however with increased pain with mobility, RN made aware and administered pain medication during session. She completed sit>stand throughout session to [place] with min A, and VCs for adhering to R UE NWB during functional tasks.  She ambulated into bathroom with CGA. Completed bathing routine from TTB, lateral leans to complete buttock hygiene and assist to wash LEs.  She returned to recliner to dress. Able to thread pants into LEs without use of AE, stood with min A to pull pants up with CGA. Following seated rest break, she stood at sink to complete portion of of hair care/ grooming tasks. Seated in w/c to finish following ~5 minutes of standing.  Education/demonstration provided for use of sock aid and reacher to use to doff socks. She return demonstrated ability to doff socks using reacher. Min A for use of sock aid as difficulty pulling it up with use of only L UE. Will benefit from cont practice in future sessions.  Pt returned to recliner at end of session, positioned with ice for comfort and LEs elevated, all needs in reach.   Session Two: Pt seen for OT session focusing on cont training and AE and functional mobility/strengthening. Pt sitting up in recliner upon arrival, voiced complaints of overall stiffness, reports being pre-medicated prior to tx session and denying need for intervention. She ambulated throughout room with guarding  assist using RW, min A to stand from low recliner. Completed toileting task with supervision and sit<>stand from [facility] over toilet with supervision. Returned out to room and completed hand hygiene in standing with supervision. Returned to recliner with cont practice of AE for LB dressing, specifically socks/shoes. Able to independently recall technique for sock aid. She was introduced to shoe funnel, however, declined need for use, able to adjust independently. Education/demonstration for use of elastic shoelaces, pt able to thread/unthread elastic shoe laces with supervision.  Following seated rest break, she ambulated from room to therapy gym with supervision using [place]. Seated on EOM, completed sit<>stand from elevated EOM, x3 sets of 5 in total without UE support and mat lowered on subsequent trials. Completed with guarding assist  And rest breaks throughout. Pt returned to room at end of session. Mod A to return to supine for management of LEs. Pt left in supine with all needs in reach, bed alarm on and ice pack applied for comfort.     Therapy Documentation Precautions:  Precautions Precautions: Fall Required Braces or Orthoses: Sling Restrictions Weight Bearing Restrictions: Yes RUE Weight Bearing: Non weight bearing RLE Weight Bearing: Weight bearing as tolerated   Therapy/Group: Individual Therapy  Teshawn Moan L 05/10/2019, 7:07 AM

## 2019-05-10 NOTE — Progress Notes (Signed)
Physical Therapy Session Note  Patient Details  Name: Claudia [name] MRN: [id number] Date of Birth: 1956-03-26  Today's Date: 05/10/2019 PT Individual Time: 0800-0900 PT Individual Time Calculation (min): 60 min   Short Term Goals: Week 1:  PT Short Term Goal 1 (Week 1): =LTG due to ELOS  Skilled Therapeutic Interventions/Progress Updates:    Pt received seated EOB, agreeable to PT session. Pt reports some soreness and tightness in R hip at surgical site, not rated. Ice pack to R hip at end of session for pain management. Sit to stand with min A to RW throughout session, v/c to not use RUE to assist with transfer. Manual w/c propulsion x 100 ft with use of L UE/LE and CGA for steering. Ambulation x 90', x 120' with RW and CGA, antalgic gait pattern with decreased stance time on RLE. Lowered RUE platform for improved fit with decreased shoulder elevation and improved pt comfort. Ascend/descend 8 x 3" stairs with one handrail with LUE and min A. Ascend/descend 4 x 6" stairs with one handrail with LUE and min A. Pt fatigues quickly with stairs. Pt left seated in recliner in room with needs in reach, ice pack to R hip.  Therapy Documentation Precautions:  Precautions Precautions: Fall Required Braces or Orthoses: Sling Restrictions Weight Bearing Restrictions: Yes RUE Weight Bearing: Non weight bearing RLE Weight Bearing: Weight bearing as tolerated    Therapy/Group: Individual Therapy   Excell Seltzer, PT, DPT  05/10/2019, 12:44 PM

## 2019-05-10 NOTE — Progress Notes (Signed)
[place] PHYSICAL MEDICINE & REHABILITATION PROGRESS NOTE   Subjective/Complaints:   Pt reports had massive BM yesterday and and multiple BMs afterwards yesterday- "had a baby" in BMs.  Doing well otherwise, took pain meds- feels stiff, but 5-6/10. Voiding OK.  ROS: Patient denies fever, rash, sore throat, blurred vision, nausea, vomiting, diarrhea, cough, shortness of breath or chest pain,   headache, or mood change.    Objective:   No results found. Recent Labs    05/10/19 0535  WBC 8.0  HGB 11.3*  HCT 34.2*  PLT 267   Recent Labs    05/10/19 0535  NA 137  K 3.9  CL 101  CO2 27  GLUCOSE 194*  BUN 9  CREATININE 0.70  CALCIUM 8.6*    Intake/Output Summary (Last 24 hours) at 05/10/2019 1055 Last data filed at 05/10/2019 0754 Gross per 24 hour  Intake 600 ml  Output -  Net 600 ml     Physical Exam: Vital Signs Blood pressure (!) 115/58, pulse 80, temperature 97.7 F (36.5 C), temperature source Oral, resp. rate 17, SpO2 99 %. Constitutional: No distress . Vital signs and labs reviewed. Sitting up in bed; appears comfortable, NAD HEENT: EOMI, oral membranes moist Neck: supple Cardiovascular: RRR without murmur.    Respiratory: CTA Bilaterally without wheezes or rales. Normal effort    GI: BS +, non-tender, non-distended  Musculoskeletal:  Comments: RUE in sling. Pt able to extend/flex elbow, mild pain with supination/pronation of arm. RLE with tenderness and proximal swelling. Right forefoot remains tender with mild swelling and bruising. Neurological: She is alertand oriented to person, place, and time. She hasnormal reflexes. Nocranial nerve deficit. Normal insight, memory, language. LUE and LLE 5/5. RUE--moves all muscles, limited by sling/pain. RLE: 1+ HF, KE (pain), 4-/5 ADF/PF (pain) Skin: Right lower extremity incision site is dressed and remains in wound VAC in place. Right foot bruised/swollen Psychiatric: pleasant      Assessment/Plan: 1. Functional deficits secondary to right subcapital hip fx, right radial head fx due to ground level fall which require 3+ hours per day of interdisciplinary therapy in a comprehensive inpatient rehab setting.  Physiatrist is providing close team supervision and 24 hour management of active medical problems listed below.  Physiatrist and rehab team continue to assess barriers to discharge/monitor patient progress toward functional and medical goals  Care Tool:  Bathing    Body parts bathed by patient: Right arm, Left arm, Chest, Abdomen, Front perineal area, Right upper leg, Left upper leg, Face   Body parts bathed by helper: Right lower leg, Left lower leg, Buttocks     Bathing assist Assist Level: Minimal Assistance - Patient > 75%     Upper Body Dressing/Undressing Upper body dressing   What is the patient wearing?: Pull over shirt    Upper body assist Assist Level: Supervision/Verbal cueing    Lower Body Dressing/Undressing Lower body dressing      What is the patient wearing?: Pants     Lower body assist Assist for lower body dressing: Contact Guard/Touching assist     Toileting Toileting    Toileting assist Assist for toileting: Minimal Assistance - Patient > 75%     Transfers Chair/bed transfer  Transfers assist     Chair/bed transfer assist level: Contact Guard/Touching assist     Locomotion Ambulation   Ambulation assist      Assist level: Minimal Assistance - Patient > 75% Assistive device: Walker-platform Max distance: 70   Walk 10 feet activity  Assist     Assist level: Minimal Assistance - Patient > 75% Assistive device: Walker-platform   Walk 50 feet activity   Assist    Assist level: Minimal Assistance - Patient > 75% Assistive device: Walker-platform    Walk 150 feet activity   Assist Walk 150 feet activity did not occur: Safety/medical concerns         Walk 10 feet on uneven surface   activity   Assist Walk 10 feet on uneven surfaces activity did not occur: Safety/medical concerns         Wheelchair     Assist Will patient use wheelchair at discharge?: Yes Type of Wheelchair: Manual    Wheelchair assist level: (difficult due to chair height) Max wheelchair distance: 50    Wheelchair 50 feet with 2 turns activity    Assist        Assist Level: Minimal Assistance - Patient > 75%   Wheelchair 150 feet activity     Assist  Wheelchair 150 feet activity did not occur: Safety/medical concerns       Blood pressure (!) 115/58, pulse 80, temperature 97.7 F (36.5 C), temperature source Oral, resp. rate 17, SpO2 99 %.    CBG (last 3)  Recent Labs    05/09/19 1700 05/09/19 2108 05/10/19 0614  GLUCAP 158* 208* 203*    Medical Problem List and Plan: 1.Decreased functional mobilitysecondary to right subcapital femoral neck fracture. S/Pright total hip arthroplasty anterior approach 05/04/2019, right radial head fx -Patient is beginning CIR therapies today including PT and OT  -WBATRLE -RUE: nonweightbearing- may weight bear through elbow with platform walker for gait however 2. Antithrombotics: -DVT/anticoagulation:interestingly a popliteal thrombus LLE   -initiated xarelto last night, second dose this morning. Can mobilize with therapy today. Spoke with daughter re DVT  -antiplatelet therapy: Aspirin 81 mg twice daily 3. Pain Management/chronic pain syndrome:Naprosyn 250 mg twice daily, oxycodone/hydrocodone and Robaxin as needed 4. Mood:Provide emotional support -antipsychotic agents: N/A 5. Neuropsych: This patientiscapable of making decisions on herown behalf. 6. Skin/Wound Care:remove prevana wound vac today  - aquacel dressing to right hip incision  -pt may shower -continue local care 7. Fluids/Electrolytes/Nutrition:Routine in and outs with  follow-up chemistries 8. PCR screening positive- MRSA. Completing 5-day course of Bactroban. 9. Diabetes mellitus. Latest hemoglobin A1c 7.9. SSI. Glucotrol 10 mg twice daily, Actos 30 mg daily  -follow for pattern  9/28- BGs mid 100s to low 200s- increase Actos to 45 mg- allergic to Metformin  10. Hypertension. Lisinopril 5 mg daily 11. Constipation. SSE this moring  -augment regular regimen  9/28- has had BMs as of yesterday.   LOS: 3 days A FACE TO FACE EVALUATION WAS PERFORMED  Claudia [name] 05/10/2019, 10:55 AM

## 2019-05-10 NOTE — IPOC Note (Signed)
Overall Plan of Care [facility]) Patient Details Name: KAYSEA BONELLO MRN: [id number] DOB: [birth year]  Admitting Diagnosis: Subcapital fracture of hip [facility])  Hospital Problems: Principal Problem:   Subcapital fracture of hip ([place])     Functional Problem List: Nursing Bladder, Bowel, Edema, Endurance, Pain  PT Balance, Endurance, Edema, Motor, Pain, Safety  OT Balance, Edema, Endurance, Pain, Skin Integrity  SLP    TR         Basic ADL's: OT Grooming, Bathing, Dressing, Toileting     Advanced  ADL's: OT       Transfers: PT Bed Mobility, Bed to Chair, Car, Manufacturing systems engineer, Metallurgist: PT Ambulation, Emergency planning/management officer, Stairs     Additional Impairments: OT None  SLP        TR      Anticipated Outcomes Item Anticipated Outcome  Self Feeding no goal  Swallowing      Basic self-care  S  Toileting  S   Bathroom Transfers S  Bowel/Bladder  Pt will be continnet x 2  Transfers  mod I with R PFRW, S/set up for car transfer  Locomotion  Mod I with R PFRW  Communication     Cognition     Pain  less than 3 out of 10  Safety/Judgment      Therapy Plan: PT Intensity: Minimum of 1-2 x/day ,45 to 90 minutes PT Frequency: 5 out of 7 days PT Duration Estimated Length of Stay: 5-7 days OT Intensity: Minimum of 1-2 x/day, 45 to 90 minutes OT Frequency: 5 out of 7 days OT Duration/Estimated Length of Stay: 7-10     Due to the current state of emergency, patients may not be receiving their 3-hours of Medicare-mandated therapy.   Team Interventions: Nursing Interventions Patient/Family Education, Bladder Management, Bowel Management, Medication Management, Pain Management, Skin Care/Wound Management, Discharge Planning  PT interventions Ambulation/gait training, Balance/vestibular training, Community reintegration, Discharge planning, DME/adaptive equipment instruction, Functional mobility training, Neuromuscular re-education, Pain management,  Patient/family education, Skin care/wound management, Stair training, Therapeutic Activities, Therapeutic Exercise, UE/LE Strength taining/ROM, Wheelchair propulsion/positioning  OT Interventions Balance/vestibular training, Discharge planning, Pain management, Self Care/advanced ADL retraining, Therapeutic Activities, UE/LE Coordination activities, Therapeutic Exercise, Skin care/wound managment, Patient/family education, Functional mobility training, Disease mangement/prevention, Community reintegration, Engineer, drilling, Neuromuscular re-education, Psychosocial support, Splinting/orthotics, UE/LE Strength taining/ROM, Wheelchair propulsion/positioning  SLP Interventions    TR Interventions    SW/CM Interventions Discharge Planning, Psychosocial Support, Patient/Family Education   Barriers to Discharge MD  Medical stability, New diabetic, Wound care, Weight and Weight bearing restrictions  Nursing Wound Care    PT Decreased caregiver support Will have 24/7 for 1 week  OT      SLP      SW       Team Discharge Planning: Destination: PT-Home ,OT- Home , SLP-  Projected Follow-up: PT-Home health PT, 24 hour supervision/assistance, OT-  Home health OT, SLP-  Projected Equipment Needs: PT-Rolling walker with 5" wheels, OT- 3 in 1 bedside comode, Tub/shower bench, SLP-  Equipment Details: PT-RW with R flatform, w/c measurements TBD (will need R elevating leg rest), OT-  Patient/family involved in discharge planning: PT- Patient,  OT-Patient, SLP-   MD ELOS: 5-7 days Medical Rehab Prognosis:  Good Assessment: Pt is a 63 yr old female with right subcapital hip fx, right radial head fx due to ground level fall at work- who is NWB proximally on RUE but can use platform walker- with uncontrolled DM, and  a new popliteal thrombus on Xarelto s/p wound VAC on R hip who has finally cleaned out/had 3+ good BMs yesterday.  Her goals are supervision- for d/c.   See Team Conference  Notes for weekly updates to the plan of care

## 2019-05-11 ENCOUNTER — Inpatient Hospital Stay (HOSPITAL_COMMUNITY): Payer: PRIVATE HEALTH INSURANCE | Admitting: Occupational Therapy

## 2019-05-11 ENCOUNTER — Inpatient Hospital Stay (HOSPITAL_COMMUNITY): Payer: PRIVATE HEALTH INSURANCE | Admitting: Physical Therapy

## 2019-05-11 DIAGNOSIS — S52181D Other fracture of upper end of right radius, subsequent encounter for closed fracture with routine healing: Secondary | ICD-10-CM

## 2019-05-11 DIAGNOSIS — E1365 Other specified diabetes mellitus with hyperglycemia: Secondary | ICD-10-CM

## 2019-05-11 LAB — GLUCOSE, CAPILLARY
Glucose-Capillary: 160 mg/dL — ABNORMAL HIGH (ref 70–99)
Glucose-Capillary: 158 mg/dL — ABNORMAL HIGH (ref 70–99)
Glucose-Capillary: 217 mg/dL — ABNORMAL HIGH (ref 70–99)
Glucose-Capillary: 162 mg/dL — ABNORMAL HIGH (ref 70–99)

## 2019-05-11 NOTE — Discharge Summary (Signed)
Physician Discharge Summary  Patient ID: Claudia [name] MRN: [id number] DOB/AGE: 64/30/1957 64 y.o.  Admit date: 05/07/2019 Discharge date: 05/13/2019  Discharge Diagnoses:  Principal Problem:   Subcapital fracture of hip ([place]) Active Problems:   DM (diabetes mellitus), secondary uncontrolled ([place])   Essential hypertension   Closed right radial fracture DVT prophylaxis Hypertension Constipation Discharged Condition: Stable  Significant Diagnostic Studies: Dg Elbow Complete Right  Result Date: 05/03/2019 CLINICAL DATA:  Right elbow pain after fall. EXAM: RIGHT ELBOW - COMPLETE 3+ VIEW COMPARISON:  None. FINDINGS: Minimally displaced radial head fracture is noted. Abnormal anterior and posterior fat pad displacement is noted suggesting underlying joint effusion. IMPRESSION: Minimally displaced radial head fracture. Electronically Signed   By: Marijo Conception M.D.   On: 05/03/2019 12:15   Dg Foot Complete Right  Result Date: 05/03/2019 CLINICAL DATA:  Right foot pain after injury at work. EXAM: RIGHT FOOT COMPLETE - 3+ VIEW COMPARISON:  None. FINDINGS: No fracture or dislocation is noted. Mild degenerative changes seen involving first metatarsophalangeal joint. No soft tissue abnormality is noted. IMPRESSION: Mild osteoarthritis of the first metatarsophalangeal joint. No acute abnormality is noted. Electronically Signed   By: Marijo Conception M.D.   On: 05/03/2019 12:13   Dg C-arm 1-60 Min  Result Date: 05/04/2019 CLINICAL DATA:  Right hip arthroplasty EXAM: OPERATIVE RIGHT HIP (WITH PELVIS IF PERFORMED) 4 VIEWS TECHNIQUE: Fluoroscopic spot image(s) were submitted for interpretation post-operatively. COMPARISON:  None. FINDINGS: Intraoperative fluoroscopic images are provided. Interval right hip arthroplasty. IMPRESSION: Intraoperative localization. Electronically Signed   By: Kathreen Devoid   On: 05/04/2019 13:55   Dg Hip Operative Unilat W Or W/o Pelvis Right  Result Date:  05/04/2019 CLINICAL DATA:  Right hip arthroplasty EXAM: OPERATIVE RIGHT HIP (WITH PELVIS IF PERFORMED) 4 VIEWS TECHNIQUE: Fluoroscopic spot image(s) were submitted for interpretation post-operatively. COMPARISON:  None. FINDINGS: Intraoperative fluoroscopic images are provided. Interval right hip arthroplasty. IMPRESSION: Intraoperative localization. Electronically Signed   By: Kathreen Devoid   On: 05/04/2019 13:55   Dg Hip Unilat With Pelvis 2-3 Views Right  Result Date: 05/03/2019 CLINICAL DATA:  Hip pain.  Mechanical fall EXAM: DG HIP (WITH OR WITHOUT PELVIS) 2-3V RIGHT COMPARISON:  None. FINDINGS: Fracture through the subcapital RIGHT femoral neck. Minimal angulation. Mild vertical migration. No dislocation. IMPRESSION: RIGHT subcapital femoral neck fracture. Electronically Signed   By: Suzy Bouchard M.D.   On: 05/03/2019 12:10   Vas [place] Lower Extremity Venous (dvt)  Result Date: 05/08/2019  Lower Venous Study Indications: Swelling.  Risk Factors: Surgery right hip repair. Limitations: Body habitus. Comparison Study: No previous study available Performing Technologist: Toma Copier RVS  Examination Guidelines: A complete evaluation includes B-mode imaging, spectral Doppler, color Doppler, and power Doppler as needed of all accessible portions of each vessel. Bilateral testing is considered an integral part of a complete examination. Limited examinations for reoccurring indications may be performed as noted.  +---------+---------------+---------+-----------+----------+--------------+ RIGHT    CompressibilityPhasicitySpontaneityPropertiesThrombus Aging +---------+---------------+---------+-----------+----------+--------------+ CFV      Partial        Yes      Yes                                 +---------+---------------+---------+-----------+----------+--------------+ SFJ      Full                                                         +---------+---------------+---------+-----------+----------+--------------+  FV Prox  Full           Yes      Yes                                 +---------+---------------+---------+-----------+----------+--------------+ FV Mid   Full                                                        +---------+---------------+---------+-----------+----------+--------------+ FV DistalFull           Yes      Yes                                 +---------+---------------+---------+-----------+----------+--------------+ PFV      Full           Yes      Yes                                 +---------+---------------+---------+-----------+----------+--------------+ POP      Full           Yes      Yes                                 +---------+---------------+---------+-----------+----------+--------------+ PTV      Full                                                        +---------+---------------+---------+-----------+----------+--------------+ PERO     Full                                                        +---------+---------------+---------+-----------+----------+--------------+   +---------+---------------+---------+-----------+----------+------------------+ LEFT     CompressibilityPhasicitySpontaneityPropertiesThrombus Aging     +---------+---------------+---------+-----------+----------+------------------+ CFV      Full                                                            +---------+---------------+---------+-----------+----------+------------------+ SFJ      Full                                                            +---------+---------------+---------+-----------+----------+------------------+ FV Prox  Full                                                            +---------+---------------+---------+-----------+----------+------------------+  FV Mid   Full                                                             +---------+---------------+---------+-----------+----------+------------------+ FV DistalFull                                                            +---------+---------------+---------+-----------+----------+------------------+ PFV      Full                                                            +---------+---------------+---------+-----------+----------+------------------+ POP      Partial                                      Acute proximal                                                           popliteal          +---------+---------------+---------+-----------+----------+------------------+ PTV      Full                                                            +---------+---------------+---------+-----------+----------+------------------+ PERO     Full                                         Difficult to image +---------+---------------+---------+-----------+----------+------------------+     Summary: Right: There is no evidence of deep vein thrombosis in the lower extremity. No cystic structure found in the popliteal fossa. Left: Findings consistent with acute deep vein thrombosis involving the left popliteal vein. No cystic structure found in the popliteal fossa.  *See table(s) above for measurements and observations. Electronically signed by Curt Jews MD on 05/08/2019 at 3:58:16 AM.    Final     Labs:  Basic Metabolic Panel: Recent Labs  Lab 05/07/19 0531 05/10/19 0535  NA 137 137  K 3.8 3.9  CL 101 101  CO2 25 27  GLUCOSE 166* 194*  BUN 14 9  CREATININE 0.84 0.70  CALCIUM 8.6* 8.6*  MG 2.0  --     CBC: Recent Labs  Lab 05/07/19 0531 05/10/19 0535  WBC 10.5 8.0  NEUTROABS  --  5.2  HGB 11.4* 11.3*  HCT 33.7* 34.2*  MCV 91.8 91.2  PLT 213 267    CBG: Recent Labs  Lab 05/11/19 2051  05/12/19 0627 05/12/19 1147 05/12/19 1647 05/12/19 2115  GLUCAP 162* 158* 184* 132* 211*   Family history.  Mother with diabetes and  bone marrow cancer.  Denies hypertension or colon cancer  Brief HPI:   Claudia [name] is a 63 y.o. right-handed female with history of hypertension, diabetes mellitus, chronic knee and low back pain maintained on hydrocodone as well as Ultram.  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  No loss of consciousness.  X-rays and imaging revealed right subcapital femoral neck fracture as well as minimally displaced right radial head fracture.  Underwent right total hip arthroplasty anterior approach 05/04/2019 per Dr. Marlou Sa.  Wound VAC applied x5 days to help incision heal.  Weightbearing as tolerated right lower extremity.  Conservative care of right radial head fracture nonweightbearing using platform walker.  Hospital course pain management.  Placed on aspirin twice daily for DVT prophylaxis.  Patient was admitted for a comprehensive rehab program.   Hospital Course: Claudia [name] was admitted to rehab 05/07/2019 for inpatient therapies to consist of PT, ST and OT at least three hours five days a week. Past admission physiatrist, therapy team and rehab RN have worked together to provide customized collaborative inpatient rehab.  In regards to patient's right subcapital femoral neck fracture underwent right total hip arthroplasty anterior approach 05/04/2019 and weightbearing as tolerated.  Right radial head fracture conservative care nonweightbearing platform walker she would follow-up orthopedic services.  Venous Doppler studies did show a popliteal thrombus left lower extremity Xarelto was initiated with no bleeding episodes.  Pain management to use of scheduled Naprosyn as well as oxycodone and hydrocodone and Robaxin as needed.  Her Naprosyn was changed to as needed due to Xarelto.  Blood pressures remain controlled on lisinopril.  Blood sugars monitored hemoglobin A1c  7.9 and patient remained on Glucotrol as well as Actos with diabetic teaching.  Bouts of constipation resolved with laxative assistance.   Blood pressures were monitored on TID basis and remained stable  Diabetes has been monitored with ac/hs CBG checks and SSI was use prn for tighter BS control.   She is continent of bowel and bladder.  He/She has made gains during rehab stay and is   He/She will continue to receive follow up therapies   after discharge  Rehab course: During patient's stay in rehab weekly team conferences were held to monitor patient's progress, set goals and discuss barriers to discharge. At admission, patient required minimal assist ambulate 4 feet hemiwalker, minimal assist supine to sit, moderate assist sit to stand.  Moderate assist upper body bathing max is lower body bathing moderate assist upper body dressing max assist lower body dressing minimal assist toilet transfers  Physical exam.  Blood pressure 116/51 pulse 75 temperature 98.4 respiration 16 oxygen saturation 93% room air Constitutional.  Alert and oriented HEENT Head.  Normocephalic and atraumatic Eyes.  Pupils round and reactive to light without discharge no nystagmus Neck.  Supple nontender no JVD no thyromegaly Cardiovascular normal rate and rhythm no friction rub or murmur heard Respiratory.  Effort normal breath sounds normal no respiratory distress without wheezes GI.  Soft no distention nontender positive bowel sounds Musculoskeletal.  Right upper extremity in sling.  Patient able to extend flex elbow mild pain with supination pronation right lower extremity tenderness and proximal swelling right forefoot tender with mild swelling and  bruising Neurological alert and oriented Left upper extremity left lower extremity 5 out of 5 right upper extremity moves all muscles limited by sling right lower extremity 1+ hip flexors knee extension inhibited by pain 4- out of 5 ankle dorsi plantarflexion wound VAC  was in place to right lower extremity  /She  has had improvement in activity tolerance, balance, postural control as well as ability to compensate for deficits. Claudia [name] has had improvement in functional use RUE/LUE  and RLE/LLE as well as improvement in awareness.  Working with energy conservation techniques ambulates 150 feet x 2 rolling walker supervision.  Up and down stairs platform rolling walker contact-guard assist.  Sit to stand platform rolling walker supervision.  Gather his belongings for activities the living and homemaking dressing grooming.  Family teaching completed plan discharge to home       Disposition: Discharge disposition: 01-Home or Self Care     Discharge to home   Diet: Diabetic diet  Special Instructions: No smoking driving or alcohol  Nonweightbearing right upper extremity platform rolling walker.  Weightbearing as tolerated right lower extremity  Medications at discharge. 1.  Tylenol as needed 2.  Aspirin 81 mg p.o. daily 3.  Glucotrol 10 mg p.o. twice daily 4.  Hydrocodone 1 to 2 tablets every 6 hours as needed pain 5.  Lisinopril 5 mg p.o. daily 6.  Robaxin 500 mg every 6 hours as needed muscle spasms 7.  Naprosyn 250 mg p.o. twice daily as needed 8.  Actos 45 mg daily 9.  Xarelto 15 mg twice daily until 05/28/2019 then begin 20 mg daily    Follow-up Information    Lovorn, Jinny Blossom, MD Follow up.   Specialty: Physical Medicine and Rehabilitation Why: only as needed Contact information: [address] [place] [address] [phone]        Meredith Pel, MD Follow up.   Specialty: Orthopedic Surgery Why: Call for appointment Contact information: [place] [address] [phone]        Tammi Sou, MD Follow up.   Specialty: Family Medicine Why: as needed. Contact information: [address] [place] [address] [phone]           Signed: Lavon [name]  [place] 05/13/2019, 5:16 AM

## 2019-05-11 NOTE — Progress Notes (Signed)
Occupational Therapy Session Note  Patient Details  Name: Claudia [name] MRN: [id number] Date of Birth: 04/17/1956  Today's Date: 05/11/2019 OT Individual Time: 1045-1200 and 1400-1448 OT Individual Time Calculation (min): 75 min and 48 min OT Missed time: 12 min (pt fatigue)   Short Term Goals: Week 1:  OT Short Term Goal 1 (Week 1): STG=LTG d/t ELOS  Skilled Therapeutic Interventions/Progress Updates:    Session One: PT seen for OT session focusing on functional transfers and LE strengthening/ROM. Pt sitting up in recliner upon arrival, voiced stiffness from prior tx session, up to date on meds and denying need for intervention. Repositioned throughout session for comfort.  In ADL apartment, completed simulated tub/shower transfer utilizing tub transfer bench. Initially required assist to manage R LE over tub wall, however, on second trial with change in positioning, able to manage LE over tub wall and complete with supervision.  Sit<>stand from low soft surface recliner with mod A and VCs for technique. Throughout functional transfers/mobility, pt requires VCs for adherence to R NWB and positional restrictions In therapy gym, completed LE strengthening seated EOM. x2 sets of 10 hell slides, glute squeezes and seated marches. Rest breaks throughout and VCs for proper form and technique and to limited compensatory strategies.  Pt returned to room. Completed toileting task and hand hygiene with supervision. Pt left seated in recliner at end of session, all needs in reach, positioned with pillows for comfort and ice applied to hip for comfort.   Session Two: PT seen for OT ADL bathing/dressing session. Pt sitting up  In recliner upon arrival, cont with complaints of stiffness, no other complaints of pain and able to participate without intervention.   ADL re-training: Bathed seated on TTB with set-up. Required VCs throughout doffing/donning of clothes to adhere to R UE NWB and limited use in  functional contexts.  She returned to recliner to dress. Mod I UB dressing. SHe was able to thread B LEs into underwear/pants and stood at [place] to pull pants up with assist required to advance fully over R hip 2/2 tight fitting pants. Min A to don non-slid socks. PT instructed yesterday in use of sock aid, however, reports not liking to use AE and reports she will have assist of caregiver to assist with this at d/c.  She completed grooming tasks standing at sink demonstrating good functional standing balance/endurance. Cont to require VCs for use of R UE in functional context. Pt tolerating standing ~10 minutes before requiring seated rest break. Pt returned to bed and min A to return to supine. She declined any further activity, wishing to rest. Positioned with pillows and ice pack for comfort. Left with all needs in reach and bed alarm on.   Therapy Documentation Precautions:  Precautions Precautions: Fall Required Braces or Orthoses: Sling Restrictions Weight Bearing Restrictions: Yes RUE Weight Bearing: Non weight bearing RLE Weight Bearing: Weight bearing as tolerated   Therapy/Group: Individual Therapy  [name] 05/11/2019, 7:01 AM
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
## 2019-05-11 NOTE — Progress Notes (Signed)
[facility] Individual Statement of Services  Patient Name:  Claudia [name]  Date:  05/11/2019  Welcome to the [facility].  Our goal is to provide you with an individualized program based on your diagnosis and situation, designed to meet your specific needs.  With this comprehensive rehabilitation program, you will be expected to participate in at least 3 hours of rehabilitation therapies Monday-Friday, with modified therapy programming on the weekends.  Your rehabilitation program will include the following services:  Physical Therapy (PT), Occupational Therapy (OT), 24 hour per day rehabilitation nursing, Case Management (Social Worker), Rehabilitation Medicine, Nutrition Services and Pharmacy Services  Weekly team conferences will be held on Wednesdays to discuss your progress.  Your Social Worker will talk with you frequently to get your input and to update you on team discussions.  Team conferences with you and your family in attendance may also be held.  Expected length of stay:  5 to 10 days  Overall anticipated outcome:  Overall independent with assistive device with supervision for ADLs and minimal assistance for stairs  Depending on your progress and recovery, your program may change. Your Social Worker will coordinate services and will keep you informed of any changes. Your Social Worker's name and contact numbers are listed  below.  The following services may also be recommended but are not provided by the [facility] will be made to provide these services after discharge if needed.  Arrangements include referral to agencies that provide these services.  Your insurance has been verified to be:  Workers Health and safety inspector Your primary doctor is:  Dr. Shawnie Dapper  Pertinent  information will be shared with your doctor and your insurance company.  Social Worker:  Alfonse Alpers, LCSW  [phone] or ([phone]  Information discussed with and copy given to patient by: Trey Sailors, 05/11/2019, 12:22 AM

## 2019-05-11 NOTE — Progress Notes (Signed)
Physical Therapy Session Note  Patient Details  Name: Claudia [name] MRN: [id number] Date of Birth: 16-Jul-1956  Today's Date: 05/11/2019 PT Individual Time: 0800-0900 PT Individual Time Calculation (min): 60 min   Short Term Goals: Week 1:  PT Short Term Goal 1 (Week 1): =LTG due to ELOS  Skilled Therapeutic Interventions/Progress Updates:    Pt received seated in recliner in room, agreeable to PT session. Pt reports 5/10 pain in R hip, premedicated prior to start of therapy session. Pt is setup A to don shirt and shorts while seated in recliner. Sit to stand with CGA to RW throughout session, SBA to pull up pants in standing. Ambulation 2 x 150 ft with RW and Supervision, antalgic gait pattern with decreased stance time on RLE. Ascend/descend one 4" step, one 6" step, and one 7" step with PFRW and CGA. Pt demos good ability to perform step with PFRW, v/c for safe LE management. Sit to stand 4 x 5 reps to [place] with Supervision assist from decreased mat table height with focus on UE placement during transfer. Pt left seated in recliner in room with needs in reach, ice pack to R hip at end of session.  Therapy Documentation Precautions:  Precautions Precautions: Fall Required Braces or Orthoses: Sling Restrictions Weight Bearing Restrictions: Yes RUE Weight Bearing: Non weight bearing RLE Weight Bearing: Weight bearing as tolerated    Therapy/Group: Individual Therapy   Excell Seltzer, PT, DPT  05/11/2019, 12:14 PM

## 2019-05-11 NOTE — Progress Notes (Signed)
[facility] PHYSICAL MEDICINE & REHABILITATION PROGRESS NOTE   Subjective/Complaints:   Pt reports on BSC this AM- needed assistance to get off commode. Pain overall getting better, but still rates 5-6/10 this AM after pain meds. Feels like R arm issues get in way more than anything.  ROS: Patient denies fever, rash, sore throat, blurred vision, nausea, vomiting, diarrhea, cough, shortness of breath or chest pain,   headache, or mood change.    Objective:   No results found. Recent Labs    05/10/19 0535  WBC 8.0  HGB 11.3*  HCT 34.2*  PLT 267   Recent Labs    05/10/19 0535  NA 137  K 3.9  CL 101  CO2 27  GLUCOSE 194*  BUN 9  CREATININE 0.70  CALCIUM 8.6*    Intake/Output Summary (Last 24 hours) at 05/11/2019 [id number] Last data filed at 05/11/2019 0737 Gross per 24 hour  Intake 718 ml  Output -  Net 718 ml     Physical Exam: Vital Signs Blood pressure (!) 126/57, pulse 74, temperature 98 F (36.7 C), resp. rate 18, SpO2 100 %. Constitutional: No distress . Vital signs reviewed. Sitting up on BSC, then also seen in manual w/c at bedside, NT in room, NAD; RUE in sling HEENT: EOMI, oral membranes moist Neck: supple Cardiovascular: RRR without murmur.    Respiratory: CTA Bilaterally without wheezes or rales. Normal effort    GI: BS +, non-tender, non-distended  Musculoskeletal:  Comments: RUE in sling. Pt able to extend/flex elbow, mild pain with supination/pronation of arm. RLE with tenderness and proximal swelling. Right forefoot remains tender with mild swelling and bruising. Neurological: She is alertand oriented to person, place, and time. She hasnormal reflexes. Nocranial nerve deficit. Normal insight, memory, language. LUE and LLE 5/5. RUE--moves all muscles, limited by sling/pain. RLE: 1+ HF, KE (pain), 4-/5 ADF/PF (pain) Skin: Right lower extremity incision site is dressed and remains in wound VAC in place. Right foot  bruised/swollen Psychiatric: pleasant     Assessment/Plan: 1. Functional deficits secondary to right subcapital hip fx, right radial head fx due to ground level fall which require 3+ hours per day of interdisciplinary therapy in a comprehensive inpatient rehab setting.  Physiatrist is providing close team supervision and 24 hour management of active medical problems listed below.  Physiatrist and rehab team continue to assess barriers to discharge/monitor patient progress toward functional and medical goals  Care Tool:  Bathing    Body parts bathed by patient: Right arm, Left arm, Chest, Abdomen, Front perineal area, Right upper leg, Left upper leg, Face   Body parts bathed by helper: Right lower leg, Left lower leg, Buttocks     Bathing assist Assist Level: Minimal Assistance - Patient > 75%     Upper Body Dressing/Undressing Upper body dressing   What is the patient wearing?: Pull over shirt    Upper body assist Assist Level: Supervision/Verbal cueing    Lower Body Dressing/Undressing Lower body dressing      What is the patient wearing?: Pants     Lower body assist Assist for lower body dressing: Contact Guard/Touching assist     Toileting Toileting    Toileting assist Assist for toileting: Minimal Assistance - Patient > 75%     Transfers Chair/bed transfer  Transfers assist     Chair/bed transfer assist level: Minimal Assistance - Patient > 75%     Locomotion Ambulation   Ambulation assist      Assist level: Contact Guard/Touching  assist Assistive device: Walker-platform Max distance: 120'   Walk 10 feet activity   Assist     Assist level: Contact Guard/Touching assist Assistive device: Walker-platform   Walk 50 feet activity   Assist    Assist level: Contact Guard/Touching assist Assistive device: Walker-platform    Walk 150 feet activity   Assist Walk 150 feet activity did not occur: Safety/medical concerns          Walk 10 feet on uneven surface  activity   Assist Walk 10 feet on uneven surfaces activity did not occur: Safety/medical concerns         Wheelchair     Assist Will patient use wheelchair at discharge?: Yes Type of Wheelchair: Manual    Wheelchair assist level: Contact Guard/Touching assist Max wheelchair distance: 100'    Wheelchair 50 feet with 2 turns activity    Assist        Assist Level: Contact Guard/Touching assist   Wheelchair 150 feet activity     Assist  Wheelchair 150 feet activity did not occur: Safety/medical concerns       Blood pressure (!) 126/57, pulse 74, temperature 98 F (36.7 C), resp. rate 18, SpO2 100 %.    CBG (last 3)  Recent Labs    05/10/19 1621 05/10/19 2118 05/11/19 0613  GLUCAP 179* 172* 160*    Medical Problem List and Plan: 1.Decreased functional mobilitysecondary to right subcapital femoral neck fracture. S/Pright total hip arthroplasty anterior approach 05/04/2019, right radial head fx -Patient is beginning CIR therapies today including PT and OT  -WBATRLE -RUE: nonweightbearing- may weight bear through elbow with platform walker for gait however 2. Antithrombotics: -DVT/anticoagulation:interestingly a popliteal thrombus LLE   -initiated xarelto last night, second dose this morning. Can mobilize with therapy today. Spoke with daughter re DVT  -antiplatelet therapy: Aspirin 81 mg twice daily 3. Pain Management/chronic pain syndrome:Naprosyn 250 mg twice daily, oxycodone/hydrocodone and Robaxin as needed  9/29- changed Naproxen to prn due to Xarelto and made ASA daily; also pt feels pain getting better 4. Mood:Provide emotional support -antipsychotic agents: N/A 5. Neuropsych: This patientiscapable of making decisions on herown behalf. 6. Skin/Wound Care:remove prevana wound vac today  - aquacel dressing to right hip incision  -pt  may shower -continue local care 7. Fluids/Electrolytes/Nutrition:Routine in and outs with follow-up chemistries 8. PCR screening positive- MRSA. Completing 5-day course of Bactroban. 9. Diabetes mellitus. Latest hemoglobin A1c 7.9. SSI. Glucotrol 10 mg twice daily, Actos 30 mg daily  -follow for pattern  9/28- BGs mid 100s to low 200s- increase Actos to 45 mg- allergic to Metformin  9/29- BGs mid 100s- much improved  10. Hypertension. Lisinopril 5 mg daily 11. Constipation. SSE this moring  -augment regular regimen  9/28- has had BMs as of yesterday.   LOS: 4 days A FACE TO FACE EVALUATION WAS PERFORMED  Claudia [name] 05/11/2019, 9:22 AM

## 2019-05-12 ENCOUNTER — Inpatient Hospital Stay (HOSPITAL_COMMUNITY): Payer: PRIVATE HEALTH INSURANCE | Admitting: Occupational Therapy

## 2019-05-12 ENCOUNTER — Inpatient Hospital Stay (HOSPITAL_COMMUNITY): Payer: PRIVATE HEALTH INSURANCE

## 2019-05-12 ENCOUNTER — Inpatient Hospital Stay (HOSPITAL_COMMUNITY): Payer: PRIVATE HEALTH INSURANCE | Admitting: Physical Therapy

## 2019-05-12 LAB — GLUCOSE, CAPILLARY
Glucose-Capillary: 158 mg/dL — ABNORMAL HIGH (ref 70–99)
Glucose-Capillary: 184 mg/dL — ABNORMAL HIGH (ref 70–99)
Glucose-Capillary: 132 mg/dL — ABNORMAL HIGH (ref 70–99)
Glucose-Capillary: 211 mg/dL — ABNORMAL HIGH (ref 70–99)

## 2019-05-12 MED ORDER — RIVAROXABAN 15 MG PO TABS: 15.0000 mg | ORAL_TABLET | Freq: Two times a day (BID) | ORAL | 0 refills | Status: DC

## 2019-05-12 MED ORDER — PIOGLITAZONE HCL 45 MG PO TABS: 45.0000 mg | ORAL_TABLET | Freq: Every day | ORAL | 1 refills | Status: DC

## 2019-05-12 MED ORDER — ACETAMINOPHEN 325 MG PO TABS: 325.0000 mg | ORAL_TABLET | Freq: Four times a day (QID) | ORAL | Status: DC | PRN

## 2019-05-12 MED ORDER — GLIPIZIDE 10 MG PO TABS: ORAL_TABLET | ORAL | 1 refills | Status: DC

## 2019-05-12 MED ORDER — DICLOFENAC SODIUM 1 % TD GEL: 2.0000 g | Freq: Every evening | TRANSDERMAL | 0 refills | Status: DC | PRN

## 2019-05-12 MED ORDER — HYDROCODONE-ACETAMINOPHEN 5-325 MG PO TABS: 1.0000 | ORAL_TABLET | Freq: Four times a day (QID) | ORAL | 0 refills | Status: DC | PRN

## 2019-05-12 MED ORDER — LISINOPRIL 5 MG PO TABS: 5.0000 mg | ORAL_TABLET | Freq: Every day | ORAL | 1 refills | Status: DC

## 2019-05-12 MED ORDER — RIVAROXABAN 20 MG PO TABS: 20.0000 mg | ORAL_TABLET | Freq: Every day | ORAL | 1 refills | Status: DC

## 2019-05-12 MED ORDER — METHOCARBAMOL 500 MG PO TABS: 500.0000 mg | ORAL_TABLET | Freq: Four times a day (QID) | ORAL | 0 refills | Status: DC | PRN

## 2019-05-12 MED ORDER — ASPIRIN 81 MG PO TBEC: 81.0000 mg | DELAYED_RELEASE_TABLET | Freq: Every day | ORAL | Status: DC

## 2019-05-12 NOTE — Progress Notes (Signed)
Social Work Patient ID: Claudia [name], female   DOB: 02/09/1956, 63 y.o.   MRN: [id number]   CSW met with pt to update her on team discussion and targeted d/c date.  She is pleased with her progress and feels ready to d/c home.  Sister will come to get her.  CSW has sent orders for DME and HH to Workers Comp who is arranging this.  CSW provided completed and signed paperwork by PA for pt's dtr for her work and for pt's car loan.  No other needs/concerns/questions at this time.  CSW remains available to assist as needed.

## 2019-05-12 NOTE — Progress Notes (Signed)
Occupational Therapy Session Note  Patient Details  Name: Claudia [name] MRN: [id number] Date of Birth: [birth year]  Today's Date: 05/12/2019 OT Individual Time: 1405-1430 OT Individual Time Calculation (min): 25 min    Short Term Goals: Week 1:  OT Short Term Goal 1 (Week 1): STG=LTG d/t ELOS  Skilled Therapeutic Interventions/Progress Updates:    Treatment session with focus on functional mobility and endurance. Pt received upright in recliner requiring increased time and adjustments to complete sit > stand without assistance.  Discussed modifications of home setup to increase safety and independence with sit > stand from lower height surfaces.  Pt reports having "lift chair" at home.  Pt completed mobility in ADL apt and in room with [place] without assistance.  Discussed home routine and family coming to provide assistance initially.  Returned to room and left in recliner with legs elevated for comfort.  Therapy Documentation Precautions:  Precautions Precautions: Fall Required Braces or Orthoses: Sling Restrictions Weight Bearing Restrictions: Yes RUE Weight Bearing: Non weight bearing RLE Weight Bearing: Weight bearing as tolerated General:   Vital Signs: Therapy Vitals Temp: 97.8 F (36.6 C) Temp Source: Oral Pulse Rate: 83 Resp: 16 BP: (!) 111/44 Patient Position (if appropriate): Lying Oxygen Therapy SpO2: 100 % O2 Device: Room Air Pain: Pain Assessment Pain Scale: 0-10 Pain Score: 6  Pain Type: Acute pain Pain Location: Hip Pain Orientation: Right Pain Descriptors / Indicators: Aching;Throbbing Pain Frequency: Constant Pain Onset: On-going Pain Intervention(s): Medication (See eMAR)   Therapy/Group: Individual Therapy  Simonne Come 05/12/2019, 3:10 PM

## 2019-05-12 NOTE — Plan of Care (Signed)
  Problem: Education: Goal: Required Educational Video(s) Outcome: Progressing   Problem: Clinical Measurements: Goal: Ability to maintain clinical measurements within normal limits will improve Outcome: Progressing Goal: Postoperative complications will be avoided or minimized Outcome: Progressing   Problem: Skin Integrity: Goal: Demonstration of wound healing without infection will improve Outcome: Progressing   Problem: Consults Goal: RH GENERAL PATIENT EDUCATION Description: See Patient Education module for education specifics. Outcome: Progressing   Problem: RH BOWEL ELIMINATION Goal: RH STG MANAGE BOWEL WITH ASSISTANCE Description: STG Manage Bowel with min Assistance. Outcome: Progressing Goal: RH STG MANAGE BOWEL W/MEDICATION W/ASSISTANCE Description: STG Manage Bowel with Medication with min Assistance. Outcome: Progressing   Problem: RH BLADDER ELIMINATION Goal: RH STG MANAGE BLADDER WITH ASSISTANCE Description: STG Manage Bladder With min Assistance Outcome: Progressing   Problem: RH SKIN INTEGRITY Goal: RH STG SKIN FREE OF INFECTION/BREAKDOWN Description: Skin will be free of infection/breakdown with min assistance,  Outcome: Progressing Goal: RH STG MAINTAIN SKIN INTEGRITY WITH ASSISTANCE Description: STG Maintain Skin Integrity With min Assistance. Outcome: Progressing Goal: RH STG ABLE TO PERFORM INCISION/WOUND CARE W/ASSISTANCE Description: STG Able To Perform Incision/Wound Care With min Assistance. Outcome: Progressing   Problem: RH SAFETY Goal: RH STG ADHERE TO SAFETY PRECAUTIONS W/ASSISTANCE/DEVICE Description: STG Adhere to Safety Precautions With min Assistance/Device. Outcome: Progressing Goal: RH STG DECREASED RISK OF FALL WITH ASSISTANCE Description: STG Decreased Risk of Fall With min Assistance. Outcome: Progressing   Problem: RH PAIN MANAGEMENT Goal: RH STG PAIN MANAGED AT OR BELOW PT'S PAIN GOAL Description: Less than 3 out of  10 Outcome: Progressing

## 2019-05-12 NOTE — Progress Notes (Signed)
Physical Therapy Session Note  Patient Details  Name: Claudia [name] MRN: [id number] Date of Birth: Sep 13, 1955  Today's Date: 05/12/2019 PT Individual Time: 1540-1610 PT Individual Time Calculation (min): 30 min   Short Term Goals: Week 1:  PT Short Term Goal 1 (Week 1): =LTG due to ELOS  Skilled Therapeutic Interventions/Progress Updates:   Pt resting in recliner.  She reported needing to use toilet.  Sit>stand from recliner seat took 3 attempts, due to low height.  Pt eventually stood safely, modified independent to [place].  PT discussed height of seat and how to raise seat of her recliner at home, PRN.  Gait with PFRW to toilet.  Pt modified independent for toileting, continent of urine.  Hand washing in standing at sink, modified independent.  PT discussed HEP with pt.  She does not use her bed at home, only rests and sleeps in her recliner, so exs chose for sitting and standing.  Given hand out and cues, pt performed 10 x 1 seated-  R long arc quad knee ext with ankle pumps at end range; standing with use of PFRW- mini squats, bil heel raises, R hamstring curls, R hip abduction.    PT Placed w/c cushion in seat of recliner.  At end of session, pt resting in recliner with ice pack on R hip, bil LEs elevated with pillow under RLE, needs at hand.     Therapy Documentation Precautions:  Precautions Precautions: Fall Required Braces or Orthoses: Sling Restrictions Weight Bearing Restrictions: Yes RUE Weight Bearing: Non weight bearing RLE Weight Bearing: Weight bearing as tolerated    Pain: Pain Assessment Pain Scale: 0-10 Pain Score: 6  Pain Type: Acute pain Pain Location: Hip Pain Orientation: Right Pain Descriptors / Indicators: Aching;Throbbing Pain Frequency: Constant Pain Onset: On-going Pain Intervention(s): Medication (See eMAR)       Therapy/Group: Individual Therapy  Gaye Scorza 05/12/2019, 4:17 PM

## 2019-05-12 NOTE — Progress Notes (Signed)
Occupational Therapy Discharge Summary  Patient Details  Name: Claudia [name] MRN: [id number] Date of Birth: 02-Jul-1956   Patient has met 9 of 9 long term goals due to improved activity tolerance, improved balance, ability to compensate for deficits and improved coordination.  Patient to discharge at overall Supervision- mod I level.  Patient's care partner is independent to provide the necessary physical assistance at discharge.  Pt to d/c home with sister and other family members who are able to provide 24hr PRN assist.  She is ambulating with PFRW mod I. Completes bathing/dressing routine from seated position. Encourage use of AE for increased independence with ADLs.   Recommendation:  Patient will benefit from ongoing skilled OT services in home health setting to continue to advance functional skills in the area of BADL, iADL and Reduce care partner burden.  Equipment: TTB, BSC, PFRW, w/c  Reasons for discharge: treatment goals met and discharge from hospital  Patient/family agrees with progress made and goals achieved: Yes  OT Discharge Precautions/Restrictions  Precautions Precautions: Fall Required Braces or Orthoses: Sling Restrictions Weight Bearing Restrictions: Yes RUE Weight Bearing: Non weight bearing RLE Weight Bearing: Weight bearing as tolerated Vision Baseline Vision/History: Wears glasses Wears Glasses: At all times Patient Visual Report: No change from baseline Vision Assessment?: No apparent visual deficits Perception  Perception: Within Functional Limits Praxis Praxis: Intact Cognition Overall Cognitive Status: Within Functional Limits for tasks assessed Arousal/Alertness: Awake/alert Orientation Level: Oriented X4 Attention: Selective Selective Attention: Appears intact Memory: Appears intact Awareness: Appears intact Problem Solving: Appears intact Safety/Judgment: Appears intact Sensation Sensation Light Touch: Impaired Detail Light Touch  Impaired Details: Impaired RLE(Numbness in R thigh at surgical site) Proprioception: Appears Intact Coordination Gross Motor Movements are Fluid and Coordinated: No Fine Motor Movements are Fluid and Coordinated: Yes Coordination and Movement Description: RLE limited due to pain, improved since eval Motor  Motor Motor: Within Functional Limits Motor - Discharge Observations: RUE/LE weakness due to injuries, improved since eval Trunk/Postural Assessment  Cervical Assessment Cervical Assessment: Exceptions to [facility] Cervical AROM Overall Cervical AROM Comments: Forward head Thoracic Assessment Thoracic Assessment: Exceptions to [facility] Thoracic AROM Overall Thoracic AROM Comments: Rounded shoulders; kyphotic Lumbar Assessment Lumbar Assessment: Exceptions to WFL(Posterior pelvic tilt) Postural Control Postural Control: Within Functional Limits  Balance Balance Balance Assessed: Yes Static Sitting Balance Static Sitting - Balance Support: Feet supported;No upper extremity supported Static Sitting - Level of Assistance: 7: Independent Dynamic Sitting Balance Dynamic Sitting - Balance Support: During functional activity;Feet supported Dynamic Sitting - Level of Assistance: 6: Modified independent (Device/Increase time) Sitting balance - Comments: Sitting on tub bench to complete bathing routine Static Standing Balance Static Standing - Balance Support: Right upper extremity supported Static Standing - Level of Assistance: 6: Modified independent (Device/Increase time) Static Standing - Comment/# of Minutes: Standing with PFRW Dynamic Standing Balance Dynamic Standing - Balance Support: Right upper extremity supported Dynamic Standing - Level of Assistance: 6: Modified independent (Device/Increase time) Dynamic Standing - Comments: Standing with PFRW Extremity/Trunk Assessment RUE Assessment RUE Assessment: Exceptions to [facility] Active Range of Motion (AROM) Comments: Full ROM General  Strength Comments: elbow fx in sling, R UE NWB RUE Body System: Ortho LUE Assessment LUE Assessment: Within Functional Limits   ,  L 05/12/2019, 10:37 AM

## 2019-05-12 NOTE — Patient Care Conference (Signed)
Inpatient RehabilitationTeam Conference and Plan of Care Update Date: 05/12/2019   Time: 9:35 AM    Patient Name: Floyd Record Number: [id number]  Date of Birth: 01/12/1956 Sex: Female         Room/Bed: 4M01C/4M01C-01 Payor Info: Payor: [employer] / Plan: [employer] / Product Type: *No Product type* /    Admit Date/Time:  05/07/2019  4:54 PM  Primary Diagnosis:  Subcapital fracture of hip [facility])  Patient Active Problem List   Diagnosis Date Noted  . Closed right radial fracture 05/10/2019  . Subcapital fracture of hip ([place]) 05/07/2019  . Hip fracture ([place]) 05/03/2019  . Essential hypertension 05/03/2019  . DM (diabetes mellitus), secondary uncontrolled ([place]) 11/06/2017  . Osteoarthritis, multiple sites 10/03/2014  . Insomnia 10/03/2014  . Viral upper respiratory tract infection with cough 06/01/2014  . Right knee pain 05/10/2013  . Obesity with body mass index of 30.0-39.9 03/23/2013    Expected Discharge Date: Expected Discharge Date: 05/13/19  Team Members Present: Physician leading conference: Dr. Alysia Penna Social Worker Present: Alfonse Alpers, LCSW Nurse Present: Rayetta Pigg, RN;Other (comment)(Kishera Joneen Caraway, RN) PT Present: Excell Seltzer, PT OT Present: Amy Rounds, OT SLP Present: Other (comment)(Erin Tamala Julian, SLP) PPS Coordinator present : Gunnar Fusi, SLP     Current Status/Progress Goal Weekly Team Focus  Bowel/Bladder   Continent of B/B. Last BM 9/29  Remain continent  Assist with transfers as needed   Swallow/Nutrition/ Hydration             ADL's   Supervision-occasional min A overall  Supervision overall  ADL re-training, functional transfers, d/c planning   Mobility   Mini assist with transfer. Pt uses platform walker  Supervision with transfers  Assist as needed, prevent injuries and fall   Communication             Safety/Cognition/ Behavioral Observations  Alert/oriented x4. Calls appropriately  Remains  cognitively intact. Educate patient on safety and fall risks, to call for assistance when needed  Assist with transfers as needed   Pain   Pt's pain level is 7 - 8/10 on pain scale. Has prn pain meds on board  Pain level of 3 or less on the pain scale of 0 -10  Assess for pain q 4hrs and medicate, turn and reposition as needed   Skin   Drsg to right hip incision intact. No signs of infection noted  Free from infection or skin breakdown  Assess q shift and document appropriately    Rehab Goals Patient on target to meet rehab goals: Yes Rehab Goals Revised: none *See Care Plan and progress notes for long and short-term goals.     Barriers to Discharge  Current Status/Progress Possible Resolutions Date Resolved   Nursing  Wound Care               PT  Decreased caregiver support                 OT                  SLP                SW                Discharge Planning/Teaching Needs:  Pt to return to her home where her family will be with her as she transitions back to home.  Pt is modified independent and can direct her  care, as needed.  Dtr is also a Marine scientist.   Team Discussion:  Pt's blood sugars are controlled and she is continent x 2.  Surgical incision on right hip looks good.  Pt is doing great with OT at S/Mod I overall.  Pt is mod I with PT with platform rolling walker.  Pt would benefit from hip kit.  Workers Comp will reimburse pt if she chooses to buy hip kit from hospital gift shop.  Revisions to Treatment Plan:  none    Medical Summary Current Status: d/c tomorrow; pain controlled with meds Weekly Focus/Goal: d/c tomorrow  Barriers to Discharge: Behavior;Decreased family/caregiver support;Medication compliance;Weight bearing restrictions  Barriers to Discharge Comments: n/a Possible Resolutions to Barriers: family training   Continued Need for Acute Rehabilitation Level of Care: The patient requires daily medical management by a physician with specialized training  in physical medicine and rehabilitation for the following reasons: Direction of a multidisciplinary physical rehabilitation program to maximize functional independence : Yes Medical management of patient stability for increased activity during participation in an intensive rehabilitation regime.: Yes Analysis of laboratory values and/or radiology reports with any subsequent need for medication adjustment and/or medical intervention. : Yes   I attest that I was present, lead the team conference, and concur with the assessment and plan of the team.Team conference was held via web/ teleconference due to [place] - 19.   Teandre Hamre, Silvestre Mesi 05/12/2019, 11:02 PM

## 2019-05-12 NOTE — Progress Notes (Signed)
Occupational Therapy Session Note  Patient Details  Name: Claudia [name] MRN: [id number] Date of Birth: 03/01/1956  Today's Date: 05/12/2019 OT Individual Time: 1000-1100 OT Individual Time Calculation (min): 60 min    Short Term Goals: Week 1:  OT Short Term Goal 1 (Week 1): STG=LTG d/t ELOS  Skilled Therapeutic Interventions/Progress Updates:    Pt seen for OT ADL bathing/dressing session. PT sitting up in recliner upon arrival, agreeable to tx session. Complaints only of stiffness in R LE, reports being pre-medicated and requesting to shower for comfort.  ADL Re-training: Bathed seated on tub transfer bench with set-up assist. She rquires VCs for safety awareness, attempting to doff clothing from standing position.  Returned to recliner to dress-mod I overall using AE. Grooming tasks completed from w/c level mod I per pt request for energy conservation.   Ambulation/transfers: Ambulated throughout room mod I using PFRW. Supervision shower stall transfer. Sit>stand throughout session mod I.  Requested to ambulate around unit in order to work out stiffness. Supervision overall with pt able to self initiate need for seated rest break.   Pt returned to room at end of session, left seated in recliner, positioned with pillows for comfort, ice applied to R hip and all needs in reach.   Therapy Documentation Precautions:  Precautions Precautions: Fall Required Braces or Orthoses: Sling Restrictions Weight Bearing Restrictions: Yes RUE Weight Bearing: Non weight bearing RLE Weight Bearing: Weight bearing as tolerated   Therapy/Group: Individual Therapy  Gervis Gaba L 05/12/2019, 10:36 AM

## 2019-05-12 NOTE — Progress Notes (Signed)
Occupational Therapy Session Note  Patient Details  Name: Claudia [name] MRN: [id number] Date of Birth: November 16, 1955  Today's Date: 05/12/2019 OT Individual Time: 0700-0730 OT Individual Time Calculation (min): 30 min    Short Term Goals: Week 1:  OT Short Term Goal 1 (Week 1): STG=LTG d/t ELOS  Skilled Therapeutic Interventions/Progress Updates:    Treatment session with focus on functional mobility in home environment and functional transfers.  Pt ambulated with RW at Mod I level while maneuvering through room to gather clothing and supplies in preparation for shower during next OT session.  Pt demonstrated good safety awareness with RW when navigating narrow spaces and having to sidestep and walk backwards.  Pt reports need to toilet.  Completed toileting with increased time, able to complete hygiene and sit > stand from [facility] over toilet with supervision.  Pt completed grooming tasks in standing.  Returned to recliner and left upright with all needs in reach.  Therapy Documentation Precautions:  Precautions Precautions: Fall Required Braces or Orthoses: Sling Restrictions Weight Bearing Restrictions: Yes RUE Weight Bearing: Non weight bearing RLE Weight Bearing: Weight bearing as tolerated General:   Vital Signs: Therapy Vitals Temp: 97.8 F (36.6 C) Pulse Rate: 75 Resp: 17 BP: 135/65 Patient Position (if appropriate): Sitting Oxygen Therapy SpO2: 100 % O2 Device: Room Air Pain:   ADL:   Vision   Perception  Perception: Within Functional Limits Praxis Praxis: Intact Exercises:   Other Treatments:     Therapy/Group: Individual Therapy  Simonne Come 05/12/2019, 8:39 AM

## 2019-05-12 NOTE — Progress Notes (Signed)
[facility] PHYSICAL MEDICINE & REHABILITATION PROGRESS NOTE   Subjective/Complaints:   Pt reports Pain is max of 5-6/10- but usually better than that.   ROS: Patient denies fever, rash, sore throat, blurred vision, nausea, vomiting, diarrhea, cough, shortness of breath or chest pain,   headache, or mood change.    Objective:   No results found. Recent Labs    05/10/19 0535  WBC 8.0  HGB 11.3*  HCT 34.2*  PLT 267   Recent Labs    05/10/19 0535  NA 137  K 3.9  CL 101  CO2 27  GLUCOSE 194*  BUN 9  CREATININE 0.70  CALCIUM 8.6*    Intake/Output Summary (Last 24 hours) at 05/12/2019 0837 Last data filed at 05/12/2019 0700 Gross per 24 hour  Intake 860 ml  Output -  Net 860 ml     Physical Exam: Vital Signs Blood pressure 135/65, pulse 75, temperature 97.8 F (36.6 C), resp. rate 17, SpO2 100 %. Constitutional: No distress . Vital signs reviewed. Sitting up in bedside in chair, RUE in sling, NAD HEENT: EOMI, oral membranes moist Neck: supple Cardiovascular: RRR without murmur.    Respiratory: CTA Bilaterally without wheezes or rales. Normal effort    GI: BS +, non-tender, non-distended  Musculoskeletal:  Comments: RUE in sling. Pt able to extend/flex elbow, mild pain with supination/pronation of arm. RLE with tenderness and proximal swelling. Right forefoot remains tender with mild swelling and bruising. Neurological: She is alertand oriented to person, place, and time. She hasnormal reflexes. Nocranial nerve deficit. Normal insight, memory, language. LUE and LLE 5/5. RUE--moves all muscles, limited by sling/pain. RLE: 1+ HF, KE (pain), 4-/5 ADF/PF (pain) Skin: Right lower extremity incision site is dressed and remains in wound VAC in place. Right foot bruised/swollen Psychiatric: pleasant     Assessment/Plan: 1. Functional deficits secondary to right subcapital hip fx, right radial head fx due to ground level fall which require 3+ hours per day  of interdisciplinary therapy in a comprehensive inpatient rehab setting.  Physiatrist is providing close team supervision and 24 hour management of active medical problems listed below.  Physiatrist and rehab team continue to assess barriers to discharge/monitor patient progress toward functional and medical goals  Care Tool:  Bathing    Body parts bathed by patient: Right arm, Left arm, Chest, Abdomen, Front perineal area, Right upper leg, Left upper leg, Face, Buttocks, Right lower leg, Left lower leg   Body parts bathed by helper: Right lower leg, Left lower leg, Buttocks     Bathing assist Assist Level: Set up assist     Upper Body Dressing/Undressing Upper body dressing   What is the patient wearing?: Pull over shirt    Upper body assist Assist Level: Independent    Lower Body Dressing/Undressing Lower body dressing      What is the patient wearing?: Underwear/pull up, Pants     Lower body assist Assist for lower body dressing: Independent with assitive device Assistive Device Comment: Standing to [place] assist Assist for toileting: Independent with assistive device Assistive Device Comment: PFRW   Transfers Chair/bed transfer  Transfers assist     Chair/bed transfer assist level: Independent with assistive device     Locomotion Ambulation   Ambulation assist      Assist level: Contact Guard/Touching assist Assistive device: Walker-platform Max distance: 150'   Walk 10 feet activity   Assist     Assist level: Contact Guard/Touching assist  Assistive device: Walker-platform   Walk 50 feet activity   Assist    Assist level: Contact Guard/Touching assist Assistive device: Walker-platform    Walk 150 feet activity   Assist Walk 150 feet activity did not occur: Safety/medical concerns  Assist level: Contact Guard/Touching assist Assistive device: Walker-platform    Walk 10 feet on uneven surface   activity   Assist Walk 10 feet on uneven surfaces activity did not occur: Safety/medical concerns         Wheelchair     Assist Will patient use wheelchair at discharge?: Yes Type of Wheelchair: Manual    Wheelchair assist level: Contact Guard/Touching assist Max wheelchair distance: 100'    Wheelchair 50 feet with 2 turns activity    Assist        Assist Level: Contact Guard/Touching assist   Wheelchair 150 feet activity     Assist  Wheelchair 150 feet activity did not occur: Safety/medical concerns       Blood pressure 135/65, pulse 75, temperature 97.8 F (36.6 C), resp. rate 17, SpO2 100 %.    CBG (last 3)  Recent Labs    05/11/19 1616 05/11/19 2051 05/12/19 0627  GLUCAP 217* 162* 158*    Medical Problem List and Plan: 1.Decreased functional mobilitysecondary to right subcapital femoral neck fracture. S/Pright total hip arthroplasty anterior approach 05/04/2019, right radial head fx -Patient is beginning CIR therapies today including PT and OT  -WBATRLE -RUE: nonweightbearing- may weight bear through elbow with platform walker for gait however 2. Antithrombotics: -DVT/anticoagulation:interestingly a popliteal thrombus LLE   -initiated xarelto last night, second dose this morning. Can mobilize with therapy today. Spoke with daughter re DVT  -antiplatelet therapy: Aspirin 81 mg twice daily 3. Pain Management/chronic pain syndrome:Naprosyn 250 mg twice daily, oxycodone/hydrocodone and Robaxin as needed  9/29- changed Naproxen to prn due to Xarelto and made ASA daily; also pt feels pain getting better 4. Mood:Provide emotional support -antipsychotic agents: N/A 5. Neuropsych: This patientiscapable of making decisions on herown behalf. 6. Skin/Wound Care:remove prevana wound vac today  - aquacel dressing to right hip incision  -pt may shower -continue  local care 7. Fluids/Electrolytes/Nutrition:Routine in and outs with follow-up chemistries 8. PCR screening positive- MRSA. Completing 5-day course of Bactroban. 9. Diabetes mellitus. Latest hemoglobin A1c 7.9. SSI. Glucotrol 10 mg twice daily, Actos 30 mg daily  -follow for pattern  9/28- BGs mid 100s to low 200s- increase Actos to 45 mg- allergic to Metformin  9/29- BGs mid 100s- much improved  10. Hypertension. Lisinopril 5 mg daily 11. Constipation. SSE this moring  -augment regular regimen  9/28- has had BMs as of yesterday.   LOS: 5 days A FACE TO FACE EVALUATION WAS PERFORMED  Claudia [name] 05/12/2019, 8:37 AM

## 2019-05-12 NOTE — Progress Notes (Signed)
Physical Therapy Discharge Summary  Patient Details  Name: Claudia [name] MRN: [id number] Date of Birth: Aug 03, 1956  Today's Date: 05/12/2019   Patient has met 8 of 9 long term goals due to improved activity tolerance, decreased pain and ability to compensate for deficits.  Patient to discharge at an ambulatory level Modified Independent.   Patient's care partner is not ncessary to provide assistance at discharge as pt is at mod I level. Pt is safe to d/c home at this time.  Reasons goals not met: Pt did not meet setup A goal for car transfer and is min A for management of RLE. Pt has met goal adequately for a safe d/c home as her family will be able to provide this level of assist.  Recommendation:  Patient will benefit from ongoing skilled PT services in home health setting to continue to advance safe functional mobility, address ongoing impairments in strength, endurance, safety, balance, and minimize fall risk.  Equipment: R PFRW; 20x18 w/c with ELR  Reasons for discharge: treatment goals met and discharge from hospital  Patient/family agrees with progress made and goals achieved: Yes  PT Discharge Precautions/Restrictions Precautions Precautions: Fall Required Braces or Orthoses: Sling Restrictions Weight Bearing Restrictions: Yes RUE Weight Bearing: Non weight bearing RLE Weight Bearing: Weight bearing as tolerated Vision/Perception  Perception Perception: Within Functional Limits Praxis Praxis: Intact  Cognition Overall Cognitive Status: Within Functional Limits for tasks assessed Arousal/Alertness: Awake/alert Orientation Level: Oriented X4 Attention: Selective Selective Attention: Appears intact Memory: Appears intact Awareness: Appears intact Problem Solving: Appears intact Safety/Judgment: Appears intact Sensation Sensation Light Touch: Impaired Detail Light Touch Impaired Details: Impaired RLE(numbness in R lateral thigh at surgical site) Proprioception:  Appears Intact Coordination Gross Motor Movements are Fluid and Coordinated: No Fine Motor Movements are Fluid and Coordinated: Yes Coordination and Movement Description: RLE limited due to pain, improved since eval Motor  Motor Motor: Within Functional Limits Motor - Discharge Observations: improved since eval  Mobility Bed Mobility Bed Mobility: Rolling Right;Rolling Left;Supine to Sit Rolling Right: Minimal Assistance - Patient > 75% Rolling Left: Minimal Assistance - Patient > 75% Supine to Sit: Minimal Assistance - Patient > 75% Transfers Transfers: Sit to Stand;Stand to Sit;Stand Pivot Transfers Sit to Stand: Independent with assistive device Stand to Sit: Independent with assistive device Stand Pivot Transfers: Independent with assistive device Transfer (Assistive device): Right platform walker Locomotion  Gait Ambulation: Yes Gait Assistance: Independent with assistive device Gait Distance (Feet): 150 Feet Assistive device: Right platform walker Gait Gait: Yes Gait Pattern: Impaired Gait Pattern: Step-to pattern;Antalgic;Right circumduction Gait velocity: decreased Stairs / Additional Locomotion Stairs: Yes Stairs Assistance: Independent with assistive device Stair Management Technique: With walker Number of Stairs: 1 Height of Stairs: 7 Wheelchair Mobility Wheelchair Mobility: Yes Wheelchair Assistance: Independent with assistive device Wheelchair Propulsion: Left upper extremity;Left lower extremity Wheelchair Parts Management: Independent Distance: 150  Trunk/Postural Assessment  Cervical Assessment Cervical Assessment: Exceptions to WFL(forward head) Thoracic Assessment Thoracic Assessment: Exceptions to WFL(rounded shoulders) Lumbar Assessment Lumbar Assessment: Exceptions to WFL(posterior pelvic tilt) Postural Control Postural Control: Within Functional Limits  Balance Balance Balance Assessed: Yes Static Sitting Balance Static Sitting - Level of  Assistance: 7: Independent Dynamic Sitting Balance Dynamic Sitting - Level of Assistance: 6: Modified independent (Device/Increase time) Static Standing Balance Static Standing - Level of Assistance: 6: Modified independent (Device/Increase time) Dynamic Standing Balance Dynamic Standing - Level of Assistance: 6: Modified independent (Device/Increase time) Extremity Assessment   RLE Assessment RLE Assessment: Exceptions to [facility] General Strength  Comments: 2+/5 hip flex, all others 5/5 grossly LLE Assessment LLE Assessment: Within Functional Limits General Strength Comments: 5/5 grossly     Excell Seltzer, PT, DPT 05/12/2019, 9:53 AM

## 2019-05-12 NOTE — Progress Notes (Signed)
Social Work Discharge Note   The overall goal for the admission was met for:   Discharge location: Yes - to her home  Length of Stay: Yes - 6 days  Discharge activity level: Yes - modified independent  Home/community participation: Yes  Services provided included: MD, RD, PT, OT, RN, Pharmacy and SW  Financial Services: Worker's Comp  Follow-up services arranged: Home Health: PT/OT from [place], DME: Platform rolling walker; 20"x18" lightweight w/c with basic cushion; tub transfer bench; bedside commode from [employer] arranged through Workers Comp and Patient/Family has no preference for HH/DME agencies  Comments (or additional information): Pt has support at home, but is doing quite well at mod I level.  Patient/Family verbalized understanding of follow-up arrangements: Yes  Individual responsible for coordination of the follow-up plan: pt  Confirmed correct DME delivered: Trey Sailors 05/12/2019    Hajra [place], Silvestre Mesi

## 2019-05-12 NOTE — Progress Notes (Signed)
Physical Therapy Session Note  Patient Details  Name: Claudia [name] MRN: [id number] Date of Birth: 08/02/1956  Today's Date: 05/12/2019 PT Individual Time: 0800-0845 PT Individual Time Calculation (min): 45 min   Short Term Goals: Week 1:  PT Short Term Goal 1 (Week 1): =LTG due to ELOS  Skilled Therapeutic Interventions/Progress Updates:    Pt received seated in recliner in room, agreeable to PT session. Pt reports 5/10 pain in R hip at surgical site, premedicated prior to start of therapy session. Pt performs sit to stand to [place] at mod I level throughout session. Ambulation x 150 ft with PFRW at mod I level. Car transfer with min A for RLE management into the car. Ascend/descend one 7" step with PFRW at mod I level. Toilet transfer at mod I level, independent for clothing management. Pt left seated in recliner in room with needs in reach, ice pack to R hip at end of session.  Therapy Documentation Precautions:  Precautions Precautions: Fall Required Braces or Orthoses: Sling Restrictions Weight Bearing Restrictions: Yes RUE Weight Bearing: Non weight bearing RLE Weight Bearing: Weight bearing as tolerated    Therapy/Group: Individual Therapy   Excell Seltzer, PT, DPT  05/12/2019, 9:36 AM

## 2019-05-13 ENCOUNTER — Telehealth: Payer: Self-pay | Admitting: *Deleted

## 2019-05-13 DIAGNOSIS — I82439 Acute embolism and thrombosis of unspecified popliteal vein: Secondary | ICD-10-CM

## 2019-05-13 HISTORY — DX: Acute embolism and thrombosis of unspecified popliteal vein: I82.439

## 2019-05-13 LAB — GLUCOSE, CAPILLARY
Glucose-Capillary: 150 mg/dL — ABNORMAL HIGH (ref 70–99)
Glucose-Capillary: 185 mg/dL — ABNORMAL HIGH (ref 70–99)

## 2019-05-13 NOTE — Plan of Care (Signed)
  Problem: Education: Goal: Required Educational Video(s) Outcome: Completed/Met   Problem: Clinical Measurements: Goal: Ability to maintain clinical measurements within normal limits will improve Outcome: Completed/Met Goal: Postoperative complications will be avoided or minimized Outcome: Completed/Met   Problem: Skin Integrity: Goal: Demonstration of wound healing without infection will improve Outcome: Completed/Met   Problem: Consults Goal: RH GENERAL PATIENT EDUCATION Description: See Patient Education module for education specifics. Outcome: Completed/Met   Problem: RH BOWEL ELIMINATION Goal: RH STG MANAGE BOWEL WITH ASSISTANCE Description: STG Manage Bowel with min Assistance. Outcome: Completed/Met Goal: RH STG MANAGE BOWEL W/MEDICATION W/ASSISTANCE Description: STG Manage Bowel with Medication with min Assistance. Outcome: Completed/Met   Problem: RH BLADDER ELIMINATION Goal: RH STG MANAGE BLADDER WITH ASSISTANCE Description: STG Manage Bladder With min Assistance Outcome: Completed/Met   Problem: RH SKIN INTEGRITY Goal: RH STG SKIN FREE OF INFECTION/BREAKDOWN Description: Skin will be free of infection/breakdown with min assistance,  Outcome: Completed/Met Goal: RH STG MAINTAIN SKIN INTEGRITY WITH ASSISTANCE Description: STG Maintain Skin Integrity With min Assistance. Outcome: Completed/Met Goal: RH STG ABLE TO PERFORM INCISION/WOUND CARE W/ASSISTANCE Description: STG Able To Perform Incision/Wound Care With min Assistance. Outcome: Completed/Met   Problem: RH SAFETY Goal: RH STG ADHERE TO SAFETY PRECAUTIONS W/ASSISTANCE/DEVICE Description: STG Adhere to Safety Precautions With min Assistance/Device. Outcome: Completed/Met Goal: RH STG DECREASED RISK OF FALL WITH ASSISTANCE Description: STG Decreased Risk of Fall With min Assistance. Outcome: Completed/Met   Problem: RH PAIN MANAGEMENT Goal: RH STG PAIN MANAGED AT OR BELOW PT'S PAIN GOAL Description:  Less than 3 out of 10 Outcome: Completed/Met

## 2019-05-13 NOTE — Telephone Encounter (Signed)
Pt called stating had Hip sx Sept 22 was d/c from hospital today and hospital had removed bandage from wound vac was and noticed a little incision opening- pt was instructed by hospital to contact our office so Dr. Marlou Sa can take a look at it so doesn't get infected, says hospital had put suture tape on it to keep it in place.  Pt would like a call back at [phone]

## 2019-05-13 NOTE — Progress Notes (Signed)
Patient is discharging home from the unit.Patent is being picked up by sister.  Discharge instructions given to patient by Linna Hoff A-PA. All patient belongings sent home with patient. No questions/concerns noted.
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
## 2019-05-13 NOTE — Telephone Encounter (Signed)
Worked her in for tomorrow a.m.

## 2019-05-14 ENCOUNTER — Ambulatory Visit (INDEPENDENT_AMBULATORY_CARE_PROVIDER_SITE_OTHER): Payer: No Typology Code available for payment source | Admitting: Orthopedic Surgery

## 2019-05-14 ENCOUNTER — Ambulatory Visit (INDEPENDENT_AMBULATORY_CARE_PROVIDER_SITE_OTHER): Payer: No Typology Code available for payment source

## 2019-05-14 ENCOUNTER — Ambulatory Visit: Payer: Self-pay

## 2019-05-14 ENCOUNTER — Encounter: Payer: Self-pay | Admitting: Orthopedic Surgery

## 2019-05-14 DIAGNOSIS — S52181D Other fracture of upper end of right radius, subsequent encounter for closed fracture with routine healing: Secondary | ICD-10-CM

## 2019-05-14 DIAGNOSIS — Z96641 Presence of right artificial hip joint: Secondary | ICD-10-CM

## 2019-05-14 DIAGNOSIS — M79671 Pain in right foot: Secondary | ICD-10-CM
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
## 2019-05-16 ENCOUNTER — Other Ambulatory Visit: Payer: Self-pay | Admitting: Family Medicine

## 2019-05-17 ENCOUNTER — Encounter: Payer: Self-pay | Admitting: Family Medicine

## 2019-05-17 ENCOUNTER — Telehealth: Payer: Self-pay | Admitting: Orthopedic Surgery

## 2019-05-17 NOTE — Telephone Encounter (Signed)
RF request for Invokana.  Didn't see this on her current med list. I contacted her and she states that she is currently taking invokana 300 mg QD.  Okay to send?    Please advise.

## 2019-05-17 NOTE — Telephone Encounter (Signed)
Ok for hhpt  2 x week for pt and dressing change thx for 2 weeks thx

## 2019-05-17 NOTE — Telephone Encounter (Signed)
Please advise. Thanks.  

## 2019-05-17 NOTE — Telephone Encounter (Signed)
Looks like she was in hosp and they took her off it while in [place]. OK to restart now that she is d/c'd home. I'll authorize RF.-thx

## 2019-05-17 NOTE — Telephone Encounter (Signed)
Patient's case manager Abigail Butts called. She would like to know if patient is suppose to have home health or not. If so, she needs a rx for home health faxed to her. Her call back number is [phone]. Fax number is [phone]

## 2019-05-17 NOTE — Telephone Encounter (Signed)
Faxed order

## 2019-05-18 ENCOUNTER — Telehealth: Payer: Self-pay | Admitting: Orthopedic Surgery

## 2019-05-18 ENCOUNTER — Other Ambulatory Visit: Payer: Self-pay | Admitting: Surgical

## 2019-05-18 MED ORDER — HYDROCODONE-ACETAMINOPHEN 5-325 MG PO TABS: 1.0000 | ORAL_TABLET | Freq: Three times a day (TID) | ORAL | 0 refills | Status: DC | PRN

## 2019-05-18 NOTE — Telephone Encounter (Signed)
Received call from Serita Grit with [employer] needing a signed copy of the order for CT scan faxed to her. The fax# is [phone]   The number to contact Abigail Butts is [phone]

## 2019-05-18 NOTE — Telephone Encounter (Signed)
Patient called needing Rx refilled (Hydrocodone)  [facility] [place] The number to contact patient is [phone]

## 2019-05-18 NOTE — Telephone Encounter (Signed)
Please advise. Thanks.  

## 2019-05-18 NOTE — Telephone Encounter (Signed)
Are you able to print referral so I can stamp Dr Forbes Cellar name on it and get it faxed?

## 2019-05-18 NOTE — Telephone Encounter (Signed)
Placed on your desk. 

## 2019-05-18 NOTE — Telephone Encounter (Signed)
faxed

## 2019-05-20 ENCOUNTER — Telehealth: Payer: Self-pay | Admitting: Orthopedic Surgery

## 2019-05-20 NOTE — Telephone Encounter (Signed)
Called patient left message  to return call to reschedule an appointment for CT Scan review. Patient have not had CT scan yet

## 2019-05-21 ENCOUNTER — Encounter: Payer: Self-pay | Admitting: Orthopedic Surgery

## 2019-05-21 ENCOUNTER — Telehealth: Payer: Self-pay | Admitting: Radiology

## 2019-05-21 ENCOUNTER — Other Ambulatory Visit: Payer: Self-pay

## 2019-05-21 ENCOUNTER — Ambulatory Visit (INDEPENDENT_AMBULATORY_CARE_PROVIDER_SITE_OTHER): Payer: No Typology Code available for payment source | Admitting: Orthopedic Surgery

## 2019-05-21 VITALS — Ht 67.5 in | Wt 237.0 lb

## 2019-05-21 DIAGNOSIS — Z96641 Presence of right artificial hip joint: Secondary | ICD-10-CM

## 2019-05-21 NOTE — Telephone Encounter (Signed)
FYI Dr. Marlou Sa requests patient have dry dressing changes 3x/week x 2 weeks to proximal right hip incision. Rx was faxed Attn: Abigail Butts to [phone]. Patient is Market researcher.
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
## 2019-05-26 ENCOUNTER — Telehealth: Payer: Self-pay | Admitting: Orthopedic Surgery

## 2019-05-26 ENCOUNTER — Other Ambulatory Visit: Payer: Self-pay | Admitting: Surgical

## 2019-05-26 MED ORDER — METHOCARBAMOL 500 MG PO TABS: 500.0000 mg | ORAL_TABLET | Freq: Three times a day (TID) | ORAL | 0 refills | Status: DC | PRN

## 2019-05-26 MED ORDER — HYDROCODONE-ACETAMINOPHEN 5-325 MG PO TABS: 1.0000 | ORAL_TABLET | Freq: Three times a day (TID) | ORAL | 0 refills | Status: DC | PRN

## 2019-05-26 NOTE — Telephone Encounter (Signed)
Patient called asked if Dr Marlou Sa will give her a call concerning her next plan of care. Patient said her right foot is really hurting her. Patient said she has a Fx and is concerned about it.  Patient said her attorney called her and advised her of the Fx.The number to contact patient is [phone]

## 2019-05-26 NOTE — Telephone Encounter (Signed)
Please advise 

## 2019-05-26 NOTE — Telephone Encounter (Signed)
Patient called asked if she should come in sooner to see Dr Marlou Sa because of the Fx of her right foot? The number to contact patient is [phone]

## 2019-05-26 NOTE — Telephone Encounter (Signed)
See below

## 2019-05-26 NOTE — Telephone Encounter (Signed)
Patient called requesting an RX refill on her Methocarbamol and Hydrocodone.  [facility] [place]  CB#[phone].  Thank you.

## 2019-05-27 NOTE — Telephone Encounter (Signed)
See others

## 2019-05-27 NOTE — Telephone Encounter (Signed)
I called patient. She is unable to get boot tomorrow. I called Dr. Marlou Sa who states this is more an avulsion type fracture and she may go into hard soled shoe instead. Patient will do that and keep follow up appt next week. If fx boot is decided to be needed, she will get then.

## 2019-05-27 NOTE — Telephone Encounter (Signed)
I called patient and advised. She states that she was called by her attorney who told her [employer] called him and said that her CT scan showed a fractured foot. Patient would like to know if Dr. Marlou Sa would like for her to come in sooner than next week. She is not wearing a boot or post op shoe.  CT was done at [facility]. Could you please have Dr. Marlou Sa review and let patient know what his plan is?  Message was sent to him yesterday evening. Thanks.

## 2019-05-27 NOTE — Telephone Encounter (Signed)
Needs fx boot pls clala thx for about 3 weeks

## 2019-05-27 NOTE — Telephone Encounter (Signed)
Wbat in fx boot ok    scan not that bad

## 2019-05-28 NOTE — Telephone Encounter (Signed)
Yes. This has been taken care of.

## 2019-05-28 NOTE — Telephone Encounter (Signed)
Did you talk to her by chance?

## 2019-05-30 ENCOUNTER — Encounter: Payer: Self-pay | Admitting: Family Medicine

## 2019-06-02 ENCOUNTER — Encounter: Payer: Self-pay | Admitting: Orthopedic Surgery

## 2019-06-02 ENCOUNTER — Telehealth: Payer: Self-pay | Admitting: Orthopedic Surgery

## 2019-06-02 ENCOUNTER — Ambulatory Visit (INDEPENDENT_AMBULATORY_CARE_PROVIDER_SITE_OTHER): Payer: No Typology Code available for payment source | Admitting: Orthopedic Surgery

## 2019-06-02 ENCOUNTER — Other Ambulatory Visit: Payer: Self-pay

## 2019-06-02 DIAGNOSIS — S52181D Other fracture of upper end of right radius, subsequent encounter for closed fracture with routine healing: Secondary | ICD-10-CM

## 2019-06-02 DIAGNOSIS — Z96641 Presence of right artificial hip joint: Secondary | ICD-10-CM

## 2019-06-02 DIAGNOSIS — S92514D Nondisplaced fracture of proximal phalanx of right lesser toe(s), subsequent encounter for fracture with routine healing: Secondary | ICD-10-CM

## 2019-06-02 NOTE — Telephone Encounter (Signed)
Patient called. She would like orders to continue PT, OT, and home nurse.

## 2019-06-03 ENCOUNTER — Other Ambulatory Visit: Payer: Self-pay | Admitting: Surgical

## 2019-06-03 ENCOUNTER — Telehealth: Payer: Self-pay | Admitting: Orthopedic Surgery

## 2019-06-03 MED ORDER — HYDROCODONE-ACETAMINOPHEN 5-325 MG PO TABS: 1.0000 | ORAL_TABLET | Freq: Three times a day (TID) | ORAL | 0 refills | Status: DC | PRN

## 2019-06-03 NOTE — Telephone Encounter (Signed)
Claudia [name], continue OT, PT and home health dressing changes until follow up appt on 06/23/2019.  Patient is W/C.  Dr. Dean-patient feels that it is going to take longer than December for her to heal and go back to work. She asked for note and I advised you would discuss at return office visit on 06/23/2019.

## 2019-06-03 NOTE — Telephone Encounter (Signed)
Patient called to requesting an RX refill on her Hydrocodone.  [facility] [place]  CB#[phone].  Thank you.

## 2019-06-03 NOTE — Telephone Encounter (Signed)
Please advise 

## 2019-06-03 NOTE — Telephone Encounter (Signed)
Ok for orders? 

## 2019-06-04 ENCOUNTER — Encounter: Payer: Self-pay | Admitting: Orthopedic Surgery
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
## 2019-06-07 NOTE — Telephone Encounter (Signed)
Sounds good thanks

## 2019-06-08 ENCOUNTER — Other Ambulatory Visit: Payer: Self-pay | Admitting: Surgical

## 2019-06-09 ENCOUNTER — Telehealth: Payer: Self-pay

## 2019-06-09 NOTE — Telephone Encounter (Signed)
Faxed 06/02/19 office note and work note to case mgr

## 2019-06-09 NOTE — Addendum Note (Signed)
Addended by: Meyer Cory on: 06/09/2019 12:07 PM   Modules accepted: Orders

## 2019-06-09 NOTE — Telephone Encounter (Signed)
Referral entered  

## 2019-06-13 ENCOUNTER — Encounter: Payer: Self-pay | Admitting: Family Medicine

## 2019-06-15 ENCOUNTER — Other Ambulatory Visit: Payer: Self-pay

## 2019-06-18 ENCOUNTER — Other Ambulatory Visit: Payer: Self-pay

## 2019-06-18 ENCOUNTER — Ambulatory Visit (INDEPENDENT_AMBULATORY_CARE_PROVIDER_SITE_OTHER): Payer: PRIVATE HEALTH INSURANCE | Admitting: Family Medicine

## 2019-06-18 ENCOUNTER — Encounter: Payer: Self-pay | Admitting: Family Medicine

## 2019-06-18 VITALS — BP 116/71 | HR 90 | Temp 98.6°F

## 2019-06-18 DIAGNOSIS — Z96641 Presence of right artificial hip joint: Secondary | ICD-10-CM

## 2019-06-18 DIAGNOSIS — E119 Type 2 diabetes mellitus without complications: Secondary | ICD-10-CM

## 2019-06-18 DIAGNOSIS — Z8781 Personal history of (healed) traumatic fracture: Secondary | ICD-10-CM

## 2019-06-18 DIAGNOSIS — M545 Low back pain, unspecified: Secondary | ICD-10-CM

## 2019-06-18 DIAGNOSIS — G894 Chronic pain syndrome: Secondary | ICD-10-CM

## 2019-06-18 DIAGNOSIS — E7800 Pure hypercholesterolemia, unspecified: Secondary | ICD-10-CM

## 2019-06-18 DIAGNOSIS — I10 Essential (primary) hypertension: Secondary | ICD-10-CM

## 2019-06-18 DIAGNOSIS — I82432 Acute embolism and thrombosis of left popliteal vein: Secondary | ICD-10-CM

## 2019-06-18 DIAGNOSIS — M170 Bilateral primary osteoarthritis of knee: Secondary | ICD-10-CM

## 2019-06-18 DIAGNOSIS — G8929 Other chronic pain: Secondary | ICD-10-CM

## 2019-06-18 MED ORDER — LISINOPRIL 5 MG PO TABS: 5.0000 mg | ORAL_TABLET | Freq: Every day | ORAL | 1 refills | Status: DC

## 2019-06-18 MED ORDER — RIVAROXABAN 20 MG PO TABS: 20.0000 mg | ORAL_TABLET | Freq: Every day | ORAL | 1 refills | Status: DC

## 2019-06-18 MED ORDER — GLIPIZIDE 10 MG PO TABS: ORAL_TABLET | ORAL | 1 refills | Status: DC

## 2019-06-18 MED ORDER — TRAMADOL HCL 50 MG PO TABS: 50.0000 mg | ORAL_TABLET | Freq: Two times a day (BID) | ORAL | 0 refills | Status: DC

## 2019-06-18 MED ORDER — BLOOD GLUCOSE MONITOR KIT: PACK | 0 refills | Status: DC

## 2019-06-18 MED ORDER — CANAGLIFLOZIN 300 MG PO TABS: 300.0000 mg | ORAL_TABLET | Freq: Every day | ORAL | 1 refills | Status: DC

## 2019-06-18 MED ORDER — PIOGLITAZONE HCL 45 MG PO TABS: 45.0000 mg | ORAL_TABLET | Freq: Every day | ORAL | 1 refills | Status: DC

## 2019-06-18 MED ORDER — HYDROCODONE-ACETAMINOPHEN 5-325 MG PO TABS: 1.0000 | ORAL_TABLET | Freq: Three times a day (TID) | ORAL | 0 refills | Status: DC | PRN
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
## 2019-06-20 ENCOUNTER — Other Ambulatory Visit: Payer: Self-pay | Admitting: Family Medicine

## 2019-06-21 ENCOUNTER — Telehealth: Payer: Self-pay | Admitting: Family Medicine

## 2019-06-21 MED ORDER — FLUTICASONE PROPIONATE 50 MCG/ACT NA SUSP: NASAL | 6 refills | Status: DC

## 2019-06-21 NOTE — Telephone Encounter (Signed)
OK, done

## 2019-06-21 NOTE — Telephone Encounter (Signed)
Pt is requesting refill for flonase spray. This med has not been prescribed by you previously. Her last visit was 06/18/19.  Please advise, thanks.

## 2019-06-21 NOTE — Telephone Encounter (Signed)
Please refill  fluticasone (FLONASE) 50 MCG/ACT nasal spray [id number]  [facility] [place]

## 2019-06-23 ENCOUNTER — Encounter: Payer: Self-pay | Admitting: Orthopedic Surgery

## 2019-06-23 ENCOUNTER — Ambulatory Visit (INDEPENDENT_AMBULATORY_CARE_PROVIDER_SITE_OTHER): Payer: No Typology Code available for payment source | Admitting: Orthopedic Surgery

## 2019-06-23 ENCOUNTER — Ambulatory Visit (INDEPENDENT_AMBULATORY_CARE_PROVIDER_SITE_OTHER): Payer: No Typology Code available for payment source

## 2019-06-23 ENCOUNTER — Other Ambulatory Visit: Payer: Self-pay

## 2019-06-23 DIAGNOSIS — S52181D Other fracture of upper end of right radius, subsequent encounter for closed fracture with routine healing: Secondary | ICD-10-CM | POA: Diagnosis not present

## 2019-06-23 DIAGNOSIS — Z96641 Presence of right artificial hip joint: Secondary | ICD-10-CM

## 2019-06-23 DIAGNOSIS — S92514D Nondisplaced fracture of proximal phalanx of right lesser toe(s), subsequent encounter for fracture with routine healing: Secondary | ICD-10-CM | POA: Diagnosis not present

## 2019-06-24 ENCOUNTER — Encounter: Payer: Self-pay | Admitting: Orthopedic Surgery

## 2019-06-24 ENCOUNTER — Other Ambulatory Visit: Payer: Self-pay | Admitting: Surgical

## 2019-06-24 ENCOUNTER — Telehealth: Payer: Self-pay | Admitting: Surgical

## 2019-06-24 LAB — EXTRA LAV TOP TUBE

## 2019-06-24 LAB — VITAMIN D 25 HYDROXY (VIT D DEFICIENCY, FRACTURES): Vit D, 25-Hydroxy: 14 ng/mL — ABNORMAL LOW (ref 30–100)

## 2019-06-24 MED ORDER — VITAMIN D (ERGOCALCIFEROL) 1.25 MG (50000 UNIT) PO CAPS: 50000.0000 [IU] | ORAL_CAPSULE | ORAL | 0 refills | Status: DC
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
## 2019-06-24 NOTE — Progress Notes (Signed)
Please call her and 50,000 units by mouth weekly for 8 weeks thanks of vitamin D.

## 2019-06-24 NOTE — Telephone Encounter (Signed)
Called patient.  Patient did not pick up.  Left voicemail message informing patient that her vitamin D level is low and recommended that she start taking 50,000 units of vitamin D every week for the next 8 weeks.  Inform patient that I prescribed this to her given pharmacy.  Recommend she call with any questions that she has.

## 2019-06-29 ENCOUNTER — Telehealth: Payer: Self-pay

## 2019-06-29 NOTE — Telephone Encounter (Signed)
Faxed 06/23/19 office note and work note to [facility] case mgr per her fax request

## 2019-07-14 ENCOUNTER — Other Ambulatory Visit: Payer: Self-pay | Admitting: Family Medicine

## 2019-07-17 ENCOUNTER — Other Ambulatory Visit: Payer: Self-pay | Admitting: Surgical

## 2019-08-04 ENCOUNTER — Encounter: Payer: Self-pay | Admitting: Orthopedic Surgery

## 2019-08-04 ENCOUNTER — Ambulatory Visit: Payer: Self-pay

## 2019-08-04 ENCOUNTER — Other Ambulatory Visit: Payer: Self-pay

## 2019-08-04 ENCOUNTER — Ambulatory Visit (INDEPENDENT_AMBULATORY_CARE_PROVIDER_SITE_OTHER): Payer: No Typology Code available for payment source | Admitting: Orthopedic Surgery

## 2019-08-04 DIAGNOSIS — S52181D Other fracture of upper end of right radius, subsequent encounter for closed fracture with routine healing: Secondary | ICD-10-CM

## 2019-08-04 DIAGNOSIS — M79671 Pain in right foot: Secondary | ICD-10-CM

## 2019-08-04 DIAGNOSIS — Z96641 Presence of right artificial hip joint: Secondary | ICD-10-CM

## 2019-08-04 DIAGNOSIS — S92514D Nondisplaced fracture of proximal phalanx of right lesser toe(s), subsequent encounter for fracture with routine healing: Secondary | ICD-10-CM

## 2019-08-04 DIAGNOSIS — M25511 Pain in right shoulder: Secondary | ICD-10-CM

## 2019-08-07 ENCOUNTER — Other Ambulatory Visit: Payer: Self-pay | Admitting: Family Medicine

## 2019-08-07 ENCOUNTER — Encounter: Payer: Self-pay | Admitting: Orthopedic Surgery
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
## 2019-08-10 ENCOUNTER — Telehealth: Payer: Self-pay | Admitting: Orthopedic Surgery

## 2019-08-10 NOTE — Telephone Encounter (Signed)
noted 

## 2019-08-10 NOTE — Telephone Encounter (Signed)
Claudia [name] with [facility] called this morning stating that they were unable to schedule her for an MRI due to her TENS unit.  He is wanting to know if it can be ordered as a CT scan instead.  CB#[phone], ext [phone].  Thank you.

## 2019-08-10 NOTE — Telephone Encounter (Signed)
Just so you know what is going on, I called and spoke with Claudia [name]. I advised that typically the TENS unit is something that can be removed and is typically not an implanted device and should be able to be removed for the MRI scan.  He was going to call patient and inquire further with her. I told him if patient unable to remove TENS unit ok to proceed with CT scan rather than the MRI.

## 2019-08-10 NOTE — Telephone Encounter (Signed)
Brandon from [facility] called. Says they are going to do a CT scan on patient.

## 2019-08-10 NOTE — Telephone Encounter (Signed)
See below

## 2019-08-12 ENCOUNTER — Telehealth: Payer: Self-pay | Admitting: Orthopedic Surgery

## 2019-08-12 DIAGNOSIS — M79671 Pain in right foot: Secondary | ICD-10-CM

## 2019-08-12 DIAGNOSIS — S92514D Nondisplaced fracture of proximal phalanx of right lesser toe(s), subsequent encounter for fracture with routine healing: Secondary | ICD-10-CM

## 2019-08-12 NOTE — Telephone Encounter (Signed)
I put this referral in. I was going to fax but no access to referrals. Can you please fax this to [facility]? Thanks.

## 2019-08-12 NOTE — Telephone Encounter (Signed)
Traci from [facility] is requesting an order for Ct Scan for patient. Please give call back at  [phone]. Fax order to [phone].

## 2019-08-12 NOTE — Telephone Encounter (Signed)
S/w Estill Bamberg. Confirmed fax was sent.

## 2019-08-13 ENCOUNTER — Other Ambulatory Visit: Payer: Self-pay | Admitting: Surgical

## 2019-08-13 ENCOUNTER — Other Ambulatory Visit: Payer: Self-pay | Admitting: Family Medicine

## 2019-08-16 NOTE — Telephone Encounter (Signed)
RF request for tramadol. Last OV 06/18/2019 Next OV 09/21/19 Last RX 06/18/2019 # 90 no RF.  Please advise.

## 2019-08-16 NOTE — Telephone Encounter (Signed)
Please advise. Thanks.  

## 2019-08-18 ENCOUNTER — Telehealth: Payer: Self-pay | Admitting: Orthopedic Surgery

## 2019-08-18 NOTE — Telephone Encounter (Signed)
Please advise. Thanks.  

## 2019-08-18 NOTE — Telephone Encounter (Signed)
Pt called in requesting a refill on her Vitamin D, please have that sent to [facility] in [place].   [phone]

## 2019-08-18 NOTE — Telephone Encounter (Signed)
IC s/w patient and advised. She verbalized understanding.  

## 2019-08-19 ENCOUNTER — Telehealth: Payer: Self-pay

## 2019-08-19 NOTE — Telephone Encounter (Addendum)
PA sent via covermymed on 08/19/19   Key: [id number]   Medication: TRAMADOL 50MG  TAB   Dx: [id number]   Per Dr. Anitra Lauth pt has tried and failed N/A   Waiting for response.   PA DENIED ON 08/19/19.

## 2019-08-20 NOTE — Telephone Encounter (Signed)
Opened in error

## 2019-08-23 ENCOUNTER — Telehealth: Payer: Self-pay | Admitting: Orthopedic Surgery

## 2019-08-23 NOTE — Telephone Encounter (Signed)
ic patient, advised her form from [place] is ready to be picked up.

## 2019-09-01 ENCOUNTER — Ambulatory Visit (INDEPENDENT_AMBULATORY_CARE_PROVIDER_SITE_OTHER): Payer: No Typology Code available for payment source | Admitting: Orthopedic Surgery

## 2019-09-01 ENCOUNTER — Encounter: Payer: Self-pay | Admitting: Orthopedic Surgery

## 2019-09-01 ENCOUNTER — Other Ambulatory Visit: Payer: Self-pay

## 2019-09-01 ENCOUNTER — Telehealth: Payer: Self-pay | Admitting: Orthopedic Surgery

## 2019-09-01 DIAGNOSIS — M79671 Pain in right foot: Secondary | ICD-10-CM | POA: Diagnosis not present

## 2019-09-01 DIAGNOSIS — S92511K Displaced fracture of proximal phalanx of right lesser toe(s), subsequent encounter for fracture with nonunion: Secondary | ICD-10-CM | POA: Diagnosis not present

## 2019-09-01 NOTE — Telephone Encounter (Signed)
Patient called.   She wants to discuss being referred to another doctor.  Call back number: [phone]

## 2019-09-01 NOTE — Telephone Encounter (Signed)
I will call patient and discuss but was curious if she indicated this to either of you at her office visit today? I know that she is w/c so they will have to approve referral for second opinion.

## 2019-09-02 NOTE — Telephone Encounter (Signed)
Tried calling patient to discuss. No answer. LMVM for her advising if  she is seeking referral to a different doctor or second opinion she would have to have authorization from work comp before we could proceed with this. Also advised she may want to speak with her case manager regarding this as well. Advised on VM to [facility] to further discuss.
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
## 2019-09-04 ENCOUNTER — Encounter: Payer: Self-pay | Admitting: Orthopedic Surgery

## 2019-09-05 ENCOUNTER — Other Ambulatory Visit: Payer: Self-pay | Admitting: Family Medicine

## 2019-09-06 ENCOUNTER — Encounter: Payer: Self-pay | Admitting: Orthopedic Surgery

## 2019-09-06 NOTE — Telephone Encounter (Signed)
I would prefer that he see Dr. Sharol Given but if she wants to see Dr. Doran Durand per Workmen's Comp. that is okay too.

## 2019-09-07 ENCOUNTER — Encounter: Payer: Self-pay | Admitting: Family Medicine

## 2019-09-09 ENCOUNTER — Ambulatory Visit: Payer: PRIVATE HEALTH INSURANCE | Admitting: Orthopedic Surgery

## 2019-09-15 ENCOUNTER — Encounter: Payer: Self-pay | Admitting: Orthopedic Surgery

## 2019-09-15 ENCOUNTER — Telehealth: Payer: Self-pay | Admitting: Orthopedic Surgery

## 2019-09-15 NOTE — Telephone Encounter (Signed)
Pt called stating she needs a extension on her out of work note; pt also stated she had insurance paper that she needs either Dr. Marlou Sa to fill out or lauren.   [phone]

## 2019-09-15 NOTE — Telephone Encounter (Signed)
[place] for oow til 2/28 after that dr hewitt can address thx

## 2019-09-15 NOTE — Telephone Encounter (Signed)
See other documentation note done per Dr Forbes Cellar request.

## 2019-09-16 NOTE — Telephone Encounter (Signed)
I can see her back after toe is fixed for rating and relaese may be about 2 more months - dr hewitt will have to keep her oow during the time he treats her

## 2019-09-17 NOTE — Telephone Encounter (Signed)
6 w after last shot

## 2019-09-21 ENCOUNTER — Other Ambulatory Visit: Payer: Self-pay

## 2019-09-21 ENCOUNTER — Ambulatory Visit (INDEPENDENT_AMBULATORY_CARE_PROVIDER_SITE_OTHER): Payer: 59 | Admitting: Family Medicine

## 2019-09-21 ENCOUNTER — Encounter: Payer: Self-pay | Admitting: Family Medicine

## 2019-09-21 VITALS — BP 106/71 | HR 74 | Temp 98.0°F | Resp 16 | Ht 67.5 in | Wt 245.8 lb

## 2019-09-21 DIAGNOSIS — Z23 Encounter for immunization: Secondary | ICD-10-CM | POA: Diagnosis not present

## 2019-09-21 DIAGNOSIS — G894 Chronic pain syndrome: Secondary | ICD-10-CM

## 2019-09-21 DIAGNOSIS — E119 Type 2 diabetes mellitus without complications: Secondary | ICD-10-CM

## 2019-09-21 DIAGNOSIS — Z7901 Long term (current) use of anticoagulants: Secondary | ICD-10-CM

## 2019-09-21 DIAGNOSIS — I82402 Acute embolism and thrombosis of unspecified deep veins of left lower extremity: Secondary | ICD-10-CM

## 2019-09-21 DIAGNOSIS — M170 Bilateral primary osteoarthritis of knee: Secondary | ICD-10-CM

## 2019-09-21 DIAGNOSIS — I10 Essential (primary) hypertension: Secondary | ICD-10-CM

## 2019-09-21 DIAGNOSIS — M47816 Spondylosis without myelopathy or radiculopathy, lumbar region: Secondary | ICD-10-CM

## 2019-09-21 DIAGNOSIS — Z8781 Personal history of (healed) traumatic fracture: Secondary | ICD-10-CM

## 2019-09-21 LAB — CBC WITH DIFFERENTIAL/PLATELET
Basophils Absolute: 0 10*3/uL (ref 0.0–0.1)
Basophils Relative: 0.3 % (ref 0.0–3.0)
Eosinophils Absolute: 0.1 10*3/uL (ref 0.0–0.7)
Eosinophils Relative: 1.4 % (ref 0.0–5.0)
HCT: 41.3 % (ref 36.0–46.0)
Hemoglobin: 13.6 g/dL (ref 12.0–15.0)
Lymphocytes Relative: 21.2 % (ref 12.0–46.0)
Lymphs Abs: 1.5 10*3/uL (ref 0.7–4.0)
MCHC: 32.9 g/dL (ref 30.0–36.0)
MCV: 87.5 fl (ref 78.0–100.0)
Monocytes Absolute: 0.6 10*3/uL (ref 0.1–1.0)
Monocytes Relative: 8.4 % (ref 3.0–12.0)
Neutro Abs: 5 10*3/uL (ref 1.4–7.7)
Neutrophils Relative %: 68.7 % (ref 43.0–77.0)
Platelets: 237 10*3/uL (ref 150.0–400.0)
RBC: 4.72 Mil/uL (ref 3.87–5.11)
RDW: 15.4 % (ref 11.5–15.5)
WBC: 7.3 10*3/uL (ref 4.0–10.5)

## 2019-09-21 LAB — COMPREHENSIVE METABOLIC PANEL
ALT: 13 U/L (ref 0–35)
AST: 12 U/L (ref 0–37)
Albumin: 3.8 g/dL (ref 3.5–5.2)
Alkaline Phosphatase: 143 U/L — ABNORMAL HIGH (ref 39–117)
BUN: 13 mg/dL (ref 6–23)
CO2: 27 mEq/L (ref 19–32)
Calcium: 9.3 mg/dL (ref 8.4–10.5)
Chloride: 102 mEq/L (ref 96–112)
Creatinine, Ser: 0.83 mg/dL (ref 0.40–1.20)
GFR: 69.15 mL/min (ref >60.00)
Glucose, Bld: 136 mg/dL — ABNORMAL HIGH (ref 70–99)
Potassium: 4.2 mEq/L (ref 3.5–5.1)
Sodium: 135 mEq/L (ref 135–145)
Total Bilirubin: 0.6 mg/dL (ref 0.2–1.2)
Total Protein: 6.9 g/dL (ref 6.0–8.3)

## 2019-09-21 LAB — LIPID PANEL
Cholesterol: 176 mg/dL (ref 0–200)
HDL: 55.4 mg/dL (ref >39.00)
LDL Cholesterol: 104 mg/dL — ABNORMAL HIGH (ref 0–99)
NonHDL: 120.48
Total CHOL/HDL Ratio: 3
Triglycerides: 82 mg/dL (ref 0.0–149.0)
VLDL: 16.4 mg/dL (ref 0.0–40.0)

## 2019-09-21 LAB — HEMOGLOBIN A1C: Hgb A1c MFr Bld: 7.2 % — ABNORMAL HIGH (ref 4.6–6.5)

## 2019-09-21 MED ORDER — HYDROCODONE-ACETAMINOPHEN 5-325 MG PO TABS: 1.0000 | ORAL_TABLET | Freq: Three times a day (TID) | ORAL | 0 refills | Status: DC | PRN

## 2019-09-21 MED ORDER — TRAMADOL HCL 50 MG PO TABS: ORAL_TABLET | ORAL | 5 refills | Status: DC
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
## 2019-10-02 ENCOUNTER — Other Ambulatory Visit: Payer: Self-pay | Admitting: Family Medicine

## 2019-10-04 ENCOUNTER — Other Ambulatory Visit: Payer: Self-pay | Admitting: Family Medicine

## 2019-10-08 DIAGNOSIS — M25571 Pain in right ankle and joints of right foot: Secondary | ICD-10-CM | POA: Insufficient documentation

## 2019-10-08 DIAGNOSIS — S92919A Unspecified fracture of unspecified toe(s), initial encounter for closed fracture: Secondary | ICD-10-CM | POA: Insufficient documentation

## 2019-10-11 ENCOUNTER — Ambulatory Visit (INDEPENDENT_AMBULATORY_CARE_PROVIDER_SITE_OTHER): Payer: No Typology Code available for payment source | Admitting: Orthopedic Surgery

## 2019-10-11 ENCOUNTER — Encounter: Payer: Self-pay | Admitting: Orthopedic Surgery

## 2019-10-11 ENCOUNTER — Other Ambulatory Visit: Payer: Self-pay | Admitting: Family Medicine

## 2019-10-11 ENCOUNTER — Other Ambulatory Visit: Payer: Self-pay

## 2019-10-11 DIAGNOSIS — M25511 Pain in right shoulder: Secondary | ICD-10-CM | POA: Diagnosis not present

## 2019-10-11 NOTE — Telephone Encounter (Signed)
Before I consider rx I need to know her symptoms. Also, this was rx'd for 10 days--does she recall why/what diagnosis? Let me know and I'll decide how to proceed.-thx

## 2019-10-11 NOTE — Telephone Encounter (Signed)
RF request for Diflucan LOV:06/18/19 Next ov: 12/20/19 Last written:09/21/19, not originally prescribed by you.   Medication pending, please advise.

## 2019-10-11 NOTE — Telephone Encounter (Signed)
MyChart message sent regarding refill °

## 2019-10-12 ENCOUNTER — Other Ambulatory Visit: Payer: Self-pay | Admitting: Family Medicine

## 2019-10-12 NOTE — Telephone Encounter (Signed)
Patient responded to MyChart message regarding refill request stating the following: For yeast infection I get them occasionally... always have  Please advise, thanks. Medication pending

## 2019-10-15 ENCOUNTER — Encounter: Payer: Self-pay | Admitting: Orthopedic Surgery
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
## 2019-10-18 ENCOUNTER — Encounter: Payer: Self-pay | Admitting: Orthopedic Surgery

## 2019-10-18 DIAGNOSIS — M25511 Pain in right shoulder: Secondary | ICD-10-CM

## 2019-10-18 MED ORDER — BUPIVACAINE HCL 0.25 % IJ SOLN
0.6600 mL | INTRAMUSCULAR | Status: AC | PRN
  Administered 2019-10-18: .66 mL via INTRA_ARTICULAR

## 2019-10-18 MED ORDER — METHYLPREDNISOLONE ACETATE 40 MG/ML IJ SUSP
13.3300 mg | INTRAMUSCULAR | Status: AC | PRN
  Administered 2019-10-18: 13.33 mg via INTRA_ARTICULAR

## 2019-10-18 MED ORDER — LIDOCAINE HCL 1 % IJ SOLN
3.0000 mL | INTRAMUSCULAR | Status: AC | PRN
  Administered 2019-10-18: 3 mL

## 2019-10-21 ENCOUNTER — Telehealth: Payer: Self-pay

## 2019-10-21 NOTE — Telephone Encounter (Signed)
Pt's pharmacy has been updated to [facility].

## 2019-10-21 NOTE — Telephone Encounter (Signed)
Patient request to update her pharmacy to  [facility].

## 2019-10-26 ENCOUNTER — Telehealth: Payer: Self-pay

## 2019-10-26 NOTE — Telephone Encounter (Signed)
Received fax for surgical clearance from [facility], Dr.Hewitt. Pt is currently scheduled 11/16/19 for rt foot. She was last seen 09/21/19 for routine follow up.   Please advise if pt needs to schedule an appt for this or if form can be filled out. Placed on PCP desk to review and sign, if appropriate.

## 2019-10-26 NOTE — Telephone Encounter (Signed)
Signed and put in box to go up front. Signed:  Crissie Sickles, MD           10/26/2019

## 2019-10-29 ENCOUNTER — Other Ambulatory Visit: Payer: Self-pay

## 2019-10-29 ENCOUNTER — Telehealth: Payer: Self-pay | Admitting: Orthopedic Surgery

## 2019-10-29 ENCOUNTER — Telehealth: Payer: Self-pay

## 2019-10-29 DIAGNOSIS — M25511 Pain in right shoulder: Secondary | ICD-10-CM

## 2019-10-29 DIAGNOSIS — M79671 Pain in right foot: Secondary | ICD-10-CM

## 2019-10-29 DIAGNOSIS — Z96641 Presence of right artificial hip joint: Secondary | ICD-10-CM

## 2019-10-29 DIAGNOSIS — E119 Type 2 diabetes mellitus without complications: Secondary | ICD-10-CM

## 2019-10-29 MED ORDER — PIOGLITAZONE HCL 45 MG PO TABS: 45.0000 mg | ORAL_TABLET | Freq: Every day | ORAL | 0 refills | Status: DC

## 2019-10-29 MED ORDER — BLOOD GLUCOSE MONITOR KIT: PACK | 0 refills | Status: DC

## 2019-10-29 NOTE — Telephone Encounter (Signed)
[facility] called and left no patient name and state they need to discuss a different person answered and said she thought the request was for Claudia [name] -- the return phone call to [name] is [phone]

## 2019-10-29 NOTE — Telephone Encounter (Signed)
Haven't seen pt since 05/13/19- never came for f/u- was asked to prn- so advised them to call Orthopedist

## 2019-10-29 NOTE — Telephone Encounter (Signed)
[facility] called requesting script for OT therapy be resent from Dr. Randel Pigg assistant or nurse. Home Health stated they have not received script Dr. Marlou Sa was to send. Phone number to facility is [phone].

## 2019-10-29 NOTE — Telephone Encounter (Signed)
faxed

## 2019-11-05 ENCOUNTER — Encounter: Payer: Self-pay | Admitting: Family Medicine

## 2019-11-05 ENCOUNTER — Telehealth: Payer: Self-pay

## 2019-11-05 NOTE — Telephone Encounter (Signed)
Patient having surgery on 4/6.  She was told she would have to be off blood thinners and to check with her PCP to verify when she needed to stop taking blood thinner prior to surgery  Please call patient [phone]

## 2019-11-05 NOTE — Telephone Encounter (Signed)
Tried calling. The office is closed. Will try to reach them again on Monday [phone]

## 2019-11-05 NOTE — Telephone Encounter (Signed)
Pls notify ortho AND the patient that she can stop xarelto NOW. She has been adequately treated for the DVT she had back at the end of Sept 2020.-thx

## 2019-11-05 NOTE — Telephone Encounter (Signed)
Patient had last visit on 10/11/19 with Ortho, Dr.Dean and in his note mentioned the following "They are planning to proceed with surgery for the fracture.  Patient is doing well regarding her right hip replacement and she is working with home physical therapy to continue gaining strength back in the right hip.  She does have some continued pain from the right radial head fracture that was nondisplaced..  "  Pt would like to know when to stop Xarelto prior to procedure. Please advise, thanks.

## 2019-11-05 NOTE — Telephone Encounter (Signed)
Dr.McGowen's office called regarding patient's upcoming surgery. The assistant states that patient can stop taking blood thinners now because she has been properly treated for DVT.

## 2019-11-05 NOTE — Telephone Encounter (Signed)
Patient and ortho have both been notified.

## 2019-11-05 NOTE — Telephone Encounter (Signed)
We do not have any upcoming surgery for her plan.  We will see her back in early May to reassess the [facility] joint.  I think she does have some toe surgery coming up with Dr. Doran Durand and that is probably who this was intended to go to.  Can you call  could call Dr. Titus Mould office and have them send that information to [facility] instead of me just because we will have any surgery coming up thanks

## 2019-11-05 NOTE — Telephone Encounter (Signed)
Pls advise.  

## 2019-11-08 ENCOUNTER — Telehealth: Payer: Self-pay | Admitting: Internal Medicine

## 2019-11-08 ENCOUNTER — Telehealth: Payer: Self-pay

## 2019-11-08 NOTE — Telephone Encounter (Signed)
Katrina from [facility] called and left voicemail on triage phone. States this patient is not their patient. She states this is the number you should call Claudia [name] [phone].   Her CB # is [phone]

## 2019-11-08 NOTE — Telephone Encounter (Signed)
IC s/w Katrina and advised per Dr Marlou Sa.

## 2019-11-08 NOTE — Telephone Encounter (Signed)
I talked with Dr Idelle Leech office this morning.

## 2019-11-08 NOTE — Telephone Encounter (Signed)
LM on nurse line advising per previous message should reach out to Dr [employer] office.

## 2019-11-08 NOTE — Telephone Encounter (Signed)
[facility] called to advise medical information. At closer inspeciton  not our patient. appears to be Dr Claudia [name] patient. I called [facility] to advise, lvm on triage line.

## 2019-11-11 HISTORY — PX: FOOT SURGERY: SHX648

## 2019-12-06 ENCOUNTER — Ambulatory Visit: Payer: 59 | Admitting: Orthopedic Surgery

## 2019-12-10 ENCOUNTER — Other Ambulatory Visit: Payer: Self-pay

## 2019-12-16 ENCOUNTER — Other Ambulatory Visit: Payer: Self-pay | Admitting: Family Medicine

## 2019-12-20 ENCOUNTER — Telehealth: Payer: 59 | Admitting: Family Medicine

## 2019-12-20 ENCOUNTER — Ambulatory Visit: Payer: 59 | Admitting: Family Medicine

## 2019-12-21 ENCOUNTER — Other Ambulatory Visit: Payer: Self-pay | Admitting: Family Medicine

## 2019-12-22 ENCOUNTER — Encounter: Payer: Self-pay | Admitting: Family Medicine

## 2019-12-22 ENCOUNTER — Telehealth (INDEPENDENT_AMBULATORY_CARE_PROVIDER_SITE_OTHER): Payer: 59 | Admitting: Family Medicine

## 2019-12-22 ENCOUNTER — Other Ambulatory Visit: Payer: Self-pay

## 2019-12-22 VITALS — BP 113/70

## 2019-12-22 DIAGNOSIS — I10 Essential (primary) hypertension: Secondary | ICD-10-CM

## 2019-12-22 DIAGNOSIS — E119 Type 2 diabetes mellitus without complications: Secondary | ICD-10-CM | POA: Diagnosis not present

## 2019-12-22 DIAGNOSIS — E559 Vitamin D deficiency, unspecified: Secondary | ICD-10-CM | POA: Diagnosis not present

## 2019-12-22 MED ORDER — HYDROCODONE-ACETAMINOPHEN 5-325 MG PO TABS: 1.0000 | ORAL_TABLET | Freq: Three times a day (TID) | ORAL | 0 refills | Status: DC | PRN

## 2019-12-22 MED ORDER — TRAMADOL HCL 50 MG PO TABS: ORAL_TABLET | ORAL | 5 refills | Status: DC

## 2019-12-22 MED ORDER — FLUTICASONE PROPIONATE 50 MCG/ACT NA SUSP: NASAL | 6 refills | Status: DC

## 2019-12-22 MED ORDER — CANAGLIFLOZIN 300 MG PO TABS: 300.0000 mg | ORAL_TABLET | Freq: Every day | ORAL | 1 refills | Status: DC

## 2019-12-22 MED ORDER — LISINOPRIL 5 MG PO TABS: 5.0000 mg | ORAL_TABLET | Freq: Every day | ORAL | 1 refills | Status: DC

## 2019-12-22 MED ORDER — GLIPIZIDE 10 MG PO TABS: ORAL_TABLET | ORAL | 1 refills | Status: DC
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
## 2019-12-27 ENCOUNTER — Other Ambulatory Visit: Payer: Self-pay

## 2019-12-27 ENCOUNTER — Ambulatory Visit (INDEPENDENT_AMBULATORY_CARE_PROVIDER_SITE_OTHER): Payer: No Typology Code available for payment source | Admitting: Orthopedic Surgery

## 2019-12-27 DIAGNOSIS — M25511 Pain in right shoulder: Secondary | ICD-10-CM | POA: Diagnosis not present

## 2019-12-27 MED ORDER — MELOXICAM 15 MG PO TABS
15.0000 mg | ORAL_TABLET | Freq: Every day | ORAL | 0 refills | Status: DC | PRN
Start: 2019-12-27 — End: 2020-01-21

## 2020-01-01 ENCOUNTER — Encounter: Payer: Self-pay | Admitting: Orthopedic Surgery
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
## 2020-01-21 ENCOUNTER — Other Ambulatory Visit: Payer: Self-pay | Admitting: Surgical

## 2020-01-21 ENCOUNTER — Other Ambulatory Visit: Payer: Self-pay | Admitting: Family Medicine

## 2020-01-21 NOTE — Telephone Encounter (Signed)
Pls advise.  

## 2020-01-25 ENCOUNTER — Other Ambulatory Visit: Payer: Self-pay

## 2020-01-25 ENCOUNTER — Ambulatory Visit (INDEPENDENT_AMBULATORY_CARE_PROVIDER_SITE_OTHER): Payer: Self-pay | Admitting: Family Medicine

## 2020-01-25 ENCOUNTER — Ambulatory Visit: Payer: 59

## 2020-01-25 DIAGNOSIS — E119 Type 2 diabetes mellitus without complications: Secondary | ICD-10-CM

## 2020-01-25 DIAGNOSIS — I10 Essential (primary) hypertension: Secondary | ICD-10-CM

## 2020-01-25 LAB — BASIC METABOLIC PANEL
BUN: 12 mg/dL (ref 6–23)
CO2: 27 mEq/L (ref 19–32)
Calcium: 9.5 mg/dL (ref 8.4–10.5)
Chloride: 102 mEq/L (ref 96–112)
Creatinine, Ser: 0.81 mg/dL (ref 0.40–1.20)
GFR: 71.05 mL/min (ref >60.00)
Glucose, Bld: 159 mg/dL — ABNORMAL HIGH (ref 70–99)
Potassium: 4.4 mEq/L (ref 3.5–5.1)
Sodium: 136 mEq/L (ref 135–145)

## 2020-01-25 LAB — HEMOGLOBIN A1C: Hgb A1c MFr Bld: 8.2 % — ABNORMAL HIGH (ref 4.6–6.5)

## 2020-02-16 ENCOUNTER — Telehealth: Payer: Self-pay

## 2020-02-16 NOTE — Telephone Encounter (Signed)
Patient dropped off [facility] form for competition.

## 2020-02-16 NOTE — Telephone Encounter (Signed)
Placed on PCP desk to review and sign, if appropriate.  

## 2020-02-16 NOTE — Telephone Encounter (Signed)
Signed and put in box to go up front. Signed:  Phil Sherrie Marsan, MD           02/16/2020  

## 2020-02-17 NOTE — Telephone Encounter (Signed)
Left VM for patient to pick up form -copy sent to scan

## 2020-02-19 ENCOUNTER — Other Ambulatory Visit: Payer: Self-pay | Admitting: Family Medicine

## 2020-02-19 ENCOUNTER — Other Ambulatory Visit: Payer: Self-pay | Admitting: Surgical

## 2020-02-21 NOTE — Telephone Encounter (Signed)
Pls advise. Thanks.  

## 2020-02-21 NOTE — Telephone Encounter (Signed)
RF request for hydrocodone 5-325 Last OV 12/22/19 Next VO 03/23/20  Last RX 12/22/19 # 180 no rf.  Please advise.

## 2020-03-02 ENCOUNTER — Telehealth: Payer: Self-pay

## 2020-03-02 NOTE — Telephone Encounter (Signed)
Received VM from April with [facility] to follow up on treatment drugs patient is currently taking involving workers comp injury. LM for her to call back.

## 2020-03-07 NOTE — Telephone Encounter (Signed)
LM for April to return call.

## 2020-03-07 NOTE — Telephone Encounter (Signed)
Received VM from [facility] regarding paperwork to follow up. Informed we did not receive this but asked that to refax to our office. She will attn to me.

## 2020-03-09 ENCOUNTER — Telehealth: Payer: Self-pay

## 2020-03-09 NOTE — Telephone Encounter (Signed)
A user error has taken place: encounter opened in error, closed for administrative reasons.

## 2020-03-09 NOTE — Telephone Encounter (Signed)
Received forms, placed on PCP desk for review

## 2020-03-18 ENCOUNTER — Other Ambulatory Visit: Payer: Self-pay | Admitting: Surgical

## 2020-03-18 ENCOUNTER — Other Ambulatory Visit: Payer: Self-pay | Admitting: Family Medicine

## 2020-03-20 NOTE — Telephone Encounter (Signed)
Rx filled for 1 month. Pt has appointment scheduled for 03/22/20

## 2020-03-21 NOTE — Telephone Encounter (Signed)
Paperwork completed and faxed to [facility]. Attn to [employer]

## 2020-03-21 NOTE — Telephone Encounter (Signed)
I already filled this paperwork out. It is in media section of chart. I printed it out. Signed:  Crissie Sickles, MD           03/21/2020

## 2020-03-22 ENCOUNTER — Other Ambulatory Visit: Payer: Self-pay

## 2020-03-22 ENCOUNTER — Encounter: Payer: Self-pay | Admitting: Family Medicine

## 2020-03-22 ENCOUNTER — Telehealth (INDEPENDENT_AMBULATORY_CARE_PROVIDER_SITE_OTHER): Payer: 59 | Admitting: Family Medicine

## 2020-03-22 VITALS — BP 116/70 | Wt 248.0 lb

## 2020-03-22 DIAGNOSIS — M79671 Pain in right foot: Secondary | ICD-10-CM | POA: Diagnosis not present

## 2020-03-22 DIAGNOSIS — I10 Essential (primary) hypertension: Secondary | ICD-10-CM

## 2020-03-22 DIAGNOSIS — M545 Low back pain, unspecified: Secondary | ICD-10-CM

## 2020-03-22 DIAGNOSIS — M170 Bilateral primary osteoarthritis of knee: Secondary | ICD-10-CM

## 2020-03-22 DIAGNOSIS — G8929 Other chronic pain: Secondary | ICD-10-CM

## 2020-03-22 DIAGNOSIS — G894 Chronic pain syndrome: Secondary | ICD-10-CM | POA: Diagnosis not present

## 2020-03-22 DIAGNOSIS — Z79899 Other long term (current) drug therapy: Secondary | ICD-10-CM

## 2020-03-22 DIAGNOSIS — E119 Type 2 diabetes mellitus without complications: Secondary | ICD-10-CM

## 2020-03-22 MED ORDER — TRAMADOL HCL 50 MG PO TABS: ORAL_TABLET | ORAL | 5 refills | Status: DC

## 2020-03-22 MED ORDER — HYDROCODONE-ACETAMINOPHEN 5-325 MG PO TABS: ORAL_TABLET | ORAL | 0 refills | Status: DC
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
## 2020-03-23 ENCOUNTER — Other Ambulatory Visit: Payer: Self-pay | Admitting: Family Medicine

## 2020-03-23 ENCOUNTER — Ambulatory Visit: Payer: 59 | Admitting: Family Medicine

## 2020-03-29 ENCOUNTER — Ambulatory Visit (INDEPENDENT_AMBULATORY_CARE_PROVIDER_SITE_OTHER): Payer: No Typology Code available for payment source | Admitting: Orthopedic Surgery

## 2020-03-29 DIAGNOSIS — M25511 Pain in right shoulder: Secondary | ICD-10-CM

## 2020-04-01 ENCOUNTER — Encounter: Payer: Self-pay | Admitting: Orthopedic Surgery
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
## 2020-04-04 ENCOUNTER — Telehealth: Payer: Self-pay | Admitting: Orthopedic Surgery

## 2020-04-04 NOTE — Telephone Encounter (Signed)
Received vm from Revonda Standard w/ [facility] [employer]. She stated she sent over a form to be completed at pts last appt and she checking status. She did not lve ext, and when I call I tried holding to speak to someone, it just disonnected, or it asks for mailbox [phone]. We do not have the form she is asking about

## 2020-04-21 ENCOUNTER — Other Ambulatory Visit: Payer: Self-pay | Admitting: Surgical

## 2020-05-04 ENCOUNTER — Ambulatory Visit: Payer: 59

## 2020-05-08 ENCOUNTER — Ambulatory Visit (INDEPENDENT_AMBULATORY_CARE_PROVIDER_SITE_OTHER): Payer: 59

## 2020-05-08 ENCOUNTER — Other Ambulatory Visit: Payer: Self-pay

## 2020-05-08 DIAGNOSIS — E119 Type 2 diabetes mellitus without complications: Secondary | ICD-10-CM | POA: Diagnosis not present

## 2020-05-08 DIAGNOSIS — Z23 Encounter for immunization: Secondary | ICD-10-CM

## 2020-05-08 DIAGNOSIS — I10 Essential (primary) hypertension: Secondary | ICD-10-CM | POA: Diagnosis not present

## 2020-05-08 LAB — HEMOGLOBIN A1C: Hgb A1c MFr Bld: 7.8 % — ABNORMAL HIGH (ref 4.6–6.5)

## 2020-05-08 LAB — BASIC METABOLIC PANEL
BUN: 13 mg/dL (ref 6–23)
CO2: 26 mEq/L (ref 19–32)
Calcium: 9.4 mg/dL (ref 8.4–10.5)
Chloride: 101 mEq/L (ref 96–112)
Creatinine, Ser: 0.88 mg/dL (ref 0.40–1.20)
GFR: 64.51 mL/min (ref >60.00)
Glucose, Bld: 151 mg/dL — ABNORMAL HIGH (ref 70–99)
Potassium: 4.3 mEq/L (ref 3.5–5.1)
Sodium: 135 mEq/L (ref 135–145)

## 2020-05-08 LAB — MICROALBUMIN / CREATININE URINE RATIO
Creatinine,U: 45.6 mg/dL
Microalb Creat Ratio: 1.8 mg/g (ref 0.0–30.0)
Microalb, Ur: 0.8 mg/dL (ref 0.0–1.9)

## 2020-05-11 ENCOUNTER — Other Ambulatory Visit: Payer: 59

## 2020-05-11 DIAGNOSIS — Z20822 Contact with and (suspected) exposure to covid-19: Secondary | ICD-10-CM

## 2020-05-12 LAB — NOVEL CORONAVIRUS, NAA: SARS-CoV-2, NAA: NOT DETECTED

## 2020-05-12 LAB — SARS-COV-2, NAA 2 DAY TAT

## 2020-05-20 ENCOUNTER — Other Ambulatory Visit: Payer: Self-pay | Admitting: Surgical

## 2020-05-22 NOTE — Telephone Encounter (Signed)
Please advise 

## 2020-05-29 ENCOUNTER — Ambulatory Visit (INDEPENDENT_AMBULATORY_CARE_PROVIDER_SITE_OTHER): Payer: No Typology Code available for payment source | Admitting: Orthopedic Surgery

## 2020-05-29 ENCOUNTER — Other Ambulatory Visit: Payer: Self-pay

## 2020-05-29 DIAGNOSIS — M25511 Pain in right shoulder: Secondary | ICD-10-CM | POA: Diagnosis not present

## 2020-05-31 ENCOUNTER — Telehealth: Payer: Self-pay | Admitting: Orthopedic Surgery

## 2020-05-31 ENCOUNTER — Encounter: Payer: Self-pay | Admitting: Orthopedic Surgery

## 2020-05-31 NOTE — Telephone Encounter (Signed)
Received vm from pt checking on 10/18 note. IC,lmvm advising it is not ready yet. Advised will call when it's ready.
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
## 2020-06-01 ENCOUNTER — Other Ambulatory Visit: Payer: Self-pay

## 2020-06-01 ENCOUNTER — Other Ambulatory Visit: Payer: Self-pay | Admitting: Family Medicine

## 2020-06-01 DIAGNOSIS — Z20822 Contact with and (suspected) exposure to covid-19: Secondary | ICD-10-CM

## 2020-06-01 NOTE — Telephone Encounter (Signed)
RF request for Diclofenac LOV:03/22/20 Next ov: 06/23/20 Last written:05/12/19 (2g,0)  Medication pending. Not originally prescribed by you

## 2020-06-02 ENCOUNTER — Other Ambulatory Visit: Payer: Self-pay

## 2020-06-02 ENCOUNTER — Telehealth: Payer: Self-pay

## 2020-06-02 NOTE — Telephone Encounter (Signed)
Patient made aware refill sent.  

## 2020-06-02 NOTE — Telephone Encounter (Signed)
Requesting:HYDROcodone-acetaminophen (NORCO/VICODIN) 5-325 MG tablet Contract:01/12/19 UDS: 11/06/17 Last Visit:03/22/20 Next Visit:06/23/20 Last Refill:03/22/20 (180,0)  Please Advise  Pt says she knows she has enough to last til the beginning of the month. She also states that she has a sinus infection that she has been dealing with for the last few weeks and would like to have something for it. I have explained to her that she may not be able to get either medication until her appt.

## 2020-06-03 LAB — SARS-COV-2, NAA 2 DAY TAT

## 2020-06-03 LAB — NOVEL CORONAVIRUS, NAA: SARS-CoV-2, NAA: NOT DETECTED

## 2020-06-03 MED ORDER — HYDROCODONE-ACETAMINOPHEN 5-325 MG PO TABS: ORAL_TABLET | ORAL | 0 refills | Status: DC

## 2020-06-03 NOTE — Telephone Encounter (Signed)
Pt aware of Rx being sent to pharmacy. States she will wait for the sinus issue

## 2020-06-03 NOTE — Addendum Note (Signed)
Addended by: Tammi Sou on: 06/03/2020 03:09 PM   Modules accepted: Orders

## 2020-06-03 NOTE — Telephone Encounter (Signed)
OK, hydroc/apap eRx'd, #30. No meds to address her sinus issues w/out actually being seen for this problem.-thx

## 2020-06-19 ENCOUNTER — Other Ambulatory Visit: Payer: Self-pay | Admitting: Surgical

## 2020-06-19 ENCOUNTER — Other Ambulatory Visit: Payer: Self-pay | Admitting: Family Medicine

## 2020-06-19 NOTE — Telephone Encounter (Signed)
Please advise 

## 2020-06-22 ENCOUNTER — Other Ambulatory Visit: Payer: Self-pay

## 2020-06-23 ENCOUNTER — Encounter: Payer: Self-pay | Admitting: Family Medicine

## 2020-06-23 ENCOUNTER — Telehealth (INDEPENDENT_AMBULATORY_CARE_PROVIDER_SITE_OTHER): Payer: Self-pay | Admitting: Family Medicine

## 2020-06-23 VITALS — BP 119/78

## 2020-06-23 DIAGNOSIS — Z79899 Other long term (current) drug therapy: Secondary | ICD-10-CM

## 2020-06-23 DIAGNOSIS — E119 Type 2 diabetes mellitus without complications: Secondary | ICD-10-CM

## 2020-06-23 DIAGNOSIS — I10 Essential (primary) hypertension: Secondary | ICD-10-CM

## 2020-06-23 DIAGNOSIS — G8929 Other chronic pain: Secondary | ICD-10-CM

## 2020-06-23 DIAGNOSIS — M170 Bilateral primary osteoarthritis of knee: Secondary | ICD-10-CM

## 2020-06-23 DIAGNOSIS — G894 Chronic pain syndrome: Secondary | ICD-10-CM

## 2020-06-23 DIAGNOSIS — M545 Low back pain, unspecified: Secondary | ICD-10-CM

## 2020-06-23 MED ORDER — HYDROCODONE-ACETAMINOPHEN 5-325 MG PO TABS: ORAL_TABLET | ORAL | 0 refills | Status: DC

## 2020-06-23 MED ORDER — PIOGLITAZONE HCL 45 MG PO TABS
45.0000 mg | ORAL_TABLET | Freq: Every day | ORAL | 5 refills | Status: DC
Start: 2020-06-23 — End: 2021-06-05

## 2020-06-23 MED ORDER — LISINOPRIL 5 MG PO TABS
5.0000 mg | ORAL_TABLET | Freq: Every day | ORAL | 3 refills | Status: DC
Start: 2020-06-23 — End: 2021-06-05

## 2020-06-23 MED ORDER — GLIPIZIDE 10 MG PO TABS: ORAL_TABLET | ORAL | 3 refills | Status: DC

## 2020-06-23 MED ORDER — CANAGLIFLOZIN 300 MG PO TABS
300.0000 mg | ORAL_TABLET | Freq: Every day | ORAL | 3 refills | Status: DC
Start: 2020-06-23 — End: 2021-06-05

## 2020-06-23 MED ORDER — TRAMADOL HCL 50 MG PO TABS: ORAL_TABLET | ORAL | 5 refills | Status: DC
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
## 2020-06-26 ENCOUNTER — Encounter: Payer: Self-pay | Admitting: Family Medicine

## 2020-07-10 ENCOUNTER — Other Ambulatory Visit: Payer: Self-pay | Admitting: Family Medicine

## 2020-07-13 ENCOUNTER — Encounter: Payer: Self-pay | Admitting: Family Medicine

## 2020-07-13 ENCOUNTER — Telehealth (INDEPENDENT_AMBULATORY_CARE_PROVIDER_SITE_OTHER): Payer: Medicare HMO | Admitting: Family Medicine

## 2020-07-13 VITALS — BP 121/78 | Temp 97.6°F

## 2020-07-13 DIAGNOSIS — H9209 Otalgia, unspecified ear: Secondary | ICD-10-CM

## 2020-07-13 DIAGNOSIS — R0981 Nasal congestion: Secondary | ICD-10-CM

## 2020-07-13 DIAGNOSIS — R519 Headache, unspecified: Secondary | ICD-10-CM | POA: Diagnosis not present

## 2020-07-13 MED ORDER — DOXYCYCLINE HYCLATE 100 MG PO TABS: 100.0000 mg | ORAL_TABLET | Freq: Two times a day (BID) | ORAL | 0 refills | Status: DC

## 2020-07-13 MED ORDER — BENZONATATE 100 MG PO CAPS: 100.0000 mg | ORAL_CAPSULE | Freq: Three times a day (TID) | ORAL | 0 refills | Status: DC | PRN

## 2020-07-13 NOTE — Telephone Encounter (Signed)
RF request for Diflucan LOV: 06/23/20 Next ov: 08/22/20 Last written: 10/12/19(10,1)  Medication pending. Please advise, thanks.

## 2020-07-13 NOTE — Patient Instructions (Signed)
-  I sent the medication(s) we discussed to your pharmacy: Meds ordered this encounter  Medications   doxycycline (VIBRA-TABS) 100 MG tablet    Sig: Take 1 tablet (100 mg total) by mouth 2 (two) times daily.    Dispense:  20 tablet    Refill:  0   benzonatate (TESSALON PERLES) 100 MG capsule    Sig: Take 1 capsule (100 mg total) by mouth 3 (three) times daily as needed.    Dispense:  20 capsule    Refill:  0     I hope you are feeling better soon!  Seek in person care promptly if your symptoms worsen, new concerns arise or you are not improving with treatment.  It was nice to meet you today. I help Dam[facility] out with telemedicine visits on Tuesdays and Thursdays and am available for visits on those days. If you have any concerns or questions following this visit please schedule a follow up visit with your Primary Care doctor or seek care at a local urgent care clinic to avoid delays in care.   

## 2020-07-13 NOTE — Progress Notes (Signed)
Virtual Visit via Telephone Note  I connected with Claudia [name] on 07/13/20 at 10:00 AM EST by telephone and verified that I am speaking with the correct person using two identifiers.   I discussed the limitations, risks, security and privacy concerns of performing an evaluation and management service by telephone and the availability of in person appointments. I also discussed with the patient that there may be a patient responsible charge related to this service. The patient expressed understanding and agreed to proceed.  Location patient: home, [place] Location provider: work or home office Participants present for the call: patient, provider Patient did not have a visit with me in the prior 7 days to address this/these issue(s).   History of Present Illness:  Acute telemedicine visit for sinus issues: -Onset: over 2 weeks ago -Symptoms include: nasal congestion, sinus discomfort - now worsening with R ear feels full, green nasal mucus and sinus discomfort, cough from tickle in throat -Denies: SOB, CP, fever, NVD,  -Has tried: "battling it with stuff over the counter" meds -Pertinent past medical history: sinus infection -Pertinent medication allergies: metformin -COVID-19 vaccine status: fully vaccinated and boosted -had negative covid test with this illness   Observations/Objective: Patient sounds cheerful and well on the phone. I do not appreciate any SOB. Speech and thought processing are grossly intact. Patient reported vitals:  Assessment and Plan:  Sinus congestion  Facial discomfort  Discomfort of ear, unspecified laterality  -we discussed possible serious and likely etiologies, options for evaluation and workup, limitations of telemedicine visit vs in person visit, treatment, treatment risks and precautions. Pt prefers to treat via telemedicine empirically rather than in person at this moment.  Given duration of symptoms with worsening, thick mucus and sinus discomfort,  query bacterial sinusitis versus other.  Possibly eustachian tube dysfunction as well.  She opted for empiric treatment of possible sinusitis with doxycycline, 100 mg twice daily for 10 days.  For the ear issues trial nasal saline, humidifier at night. Reports she has tolerated this medication well in the past.  Scheduled follow up with PCP offered: Agrees to follow-up if needed. Advised to seek prompt in person care if worsening, new symptoms arise, or if is not improving with treatment. Advised of options for inperson care in case PCP office not available. Did let the patient know that I only do telemedicine shifts for  on Tuesdays and Thursdays and advised a follow up visit with PCP or at an [facility] if has further questions or concerns.   Follow Up Instructions:  I did not refer this patient for an OV with me in the next 24 hours for this/these issue(s).  I discussed the assessment and treatment plan with the patient. The patient was provided an opportunity to ask questions and all were answered. The patient agreed with the plan and demonstrated an understanding of the instructions.   I spent 15 minutes on this encounter.   Lucretia Kern, DO

## 2020-07-17 ENCOUNTER — Other Ambulatory Visit: Payer: Self-pay | Admitting: Surgical

## 2020-07-17 NOTE — Telephone Encounter (Signed)
Please advise. Thanks.  

## 2020-07-17 NOTE — Telephone Encounter (Signed)
Left detailed message advising medication has been sent.

## 2020-07-20 DIAGNOSIS — E119 Type 2 diabetes mellitus without complications: Secondary | ICD-10-CM | POA: Diagnosis not present

## 2020-07-20 DIAGNOSIS — Z0100 Encounter for examination of eyes and vision without abnormal findings: Secondary | ICD-10-CM | POA: Diagnosis not present

## 2020-07-20 DIAGNOSIS — H2513 Age-related nuclear cataract, bilateral: Secondary | ICD-10-CM | POA: Diagnosis not present

## 2020-07-20 DIAGNOSIS — H5200 Hypermetropia, unspecified eye: Secondary | ICD-10-CM | POA: Diagnosis not present

## 2020-07-27 ENCOUNTER — Telehealth: Payer: Self-pay | Admitting: Orthopedic Surgery

## 2020-07-27 NOTE — Telephone Encounter (Signed)
Received disability form    Forwarding to [place] today

## 2020-08-12 DIAGNOSIS — L03113 Cellulitis of right upper limb: Secondary | ICD-10-CM

## 2020-08-12 HISTORY — DX: Cellulitis of right upper limb: L03.113

## 2020-08-16 ENCOUNTER — Other Ambulatory Visit: Payer: Self-pay | Admitting: Surgical

## 2020-08-16 NOTE — Telephone Encounter (Signed)
Pls advise.  

## 2020-08-21 ENCOUNTER — Telehealth: Payer: Self-pay

## 2020-08-21 NOTE — Telephone Encounter (Signed)
Appt r/s for 09/04/20  [facility] - Client Nonclinical Telephone Record  [facility] - Client Client Site [facility] - Day Physician Crissie Sickles - MD Contact Type Call Who Is Calling Patient / Member / Family / Caregiver Caller Name Sadi Arave Caller Phone Number [phone] Patient Name Claudia [name] Patient DOB 12/01/1955 Call Type Message Only Information Provided Reason for Call Request to Reschedule Office Appointment Initial Comment Caller has an appt tomorrow at 830 but wanting to know if can change to virtual as she's been exposed to covid by her daughter -- Disp. Time Disposition Final User 08/21/2020 8:14:07 AM General Information Provided Yes Kenton Kingfisher, Lanette Call Closed By: Nelia Shi Transaction Date/Time: 08/21/2020 8:09:14 AM (ET)

## 2020-08-22 ENCOUNTER — Ambulatory Visit: Payer: Self-pay | Admitting: Family Medicine

## 2020-08-24 ENCOUNTER — Other Ambulatory Visit: Payer: Medicare HMO

## 2020-08-24 DIAGNOSIS — Z20822 Contact with and (suspected) exposure to covid-19: Secondary | ICD-10-CM | POA: Diagnosis not present

## 2020-08-27 LAB — NOVEL CORONAVIRUS, NAA: SARS-CoV-2, NAA: NOT DETECTED
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
## 2020-09-04 ENCOUNTER — Encounter: Payer: Self-pay | Admitting: Family Medicine

## 2020-09-04 ENCOUNTER — Ambulatory Visit (INDEPENDENT_AMBULATORY_CARE_PROVIDER_SITE_OTHER): Payer: Medicare HMO | Admitting: Family Medicine

## 2020-09-04 ENCOUNTER — Other Ambulatory Visit: Payer: Self-pay

## 2020-09-04 VITALS — BP 100/62 | HR 76 | Temp 97.6°F | Resp 16 | Ht 67.5 in | Wt 251.8 lb

## 2020-09-04 DIAGNOSIS — E119 Type 2 diabetes mellitus without complications: Secondary | ICD-10-CM | POA: Diagnosis not present

## 2020-09-04 DIAGNOSIS — Z0000 Encounter for general adult medical examination without abnormal findings: Secondary | ICD-10-CM | POA: Diagnosis not present

## 2020-09-04 DIAGNOSIS — Z1231 Encounter for screening mammogram for malignant neoplasm of breast: Secondary | ICD-10-CM

## 2020-09-04 DIAGNOSIS — I10 Essential (primary) hypertension: Secondary | ICD-10-CM | POA: Diagnosis not present

## 2020-09-04 DIAGNOSIS — E2839 Other primary ovarian failure: Secondary | ICD-10-CM | POA: Diagnosis not present

## 2020-09-04 DIAGNOSIS — Z23 Encounter for immunization: Secondary | ICD-10-CM

## 2020-09-04 LAB — COMPREHENSIVE METABOLIC PANEL
ALT: 14 U/L (ref 0–35)
AST: 12 U/L (ref 0–37)
Albumin: 4.2 g/dL (ref 3.5–5.2)
Alkaline Phosphatase: 157 U/L — ABNORMAL HIGH (ref 39–117)
BUN: 11 mg/dL (ref 6–23)
CO2: 26 mEq/L (ref 19–32)
Calcium: 9.9 mg/dL (ref 8.4–10.5)
Chloride: 100 mEq/L (ref 96–112)
Creatinine, Ser: 0.85 mg/dL (ref 0.40–1.20)
GFR: 71.99 mL/min (ref >60.00)
Glucose, Bld: 167 mg/dL — ABNORMAL HIGH (ref 70–99)
Potassium: 4.4 mEq/L (ref 3.5–5.1)
Sodium: 135 mEq/L (ref 135–145)
Total Bilirubin: 0.7 mg/dL (ref 0.2–1.2)
Total Protein: 7.4 g/dL (ref 6.0–8.3)

## 2020-09-04 LAB — LIPID PANEL
Cholesterol: 188 mg/dL (ref 0–200)
HDL: 57.7 mg/dL (ref >39.00)
LDL Cholesterol: 105 mg/dL — ABNORMAL HIGH (ref 0–99)
NonHDL: 130.22
Total CHOL/HDL Ratio: 3
Triglycerides: 126 mg/dL (ref 0.0–149.0)
VLDL: 25.2 mg/dL (ref 0.0–40.0)

## 2020-09-04 LAB — CBC WITH DIFFERENTIAL/PLATELET
Basophils Absolute: 0.1 10*3/uL (ref 0.0–0.1)
Basophils Relative: 0.8 % (ref 0.0–3.0)
Eosinophils Absolute: 0.1 10*3/uL (ref 0.0–0.7)
Eosinophils Relative: 1.2 % (ref 0.0–5.0)
HCT: 43.3 % (ref 36.0–46.0)
Hemoglobin: 14.5 g/dL (ref 12.0–15.0)
Lymphocytes Relative: 19.7 % (ref 12.0–46.0)
Lymphs Abs: 1.6 10*3/uL (ref 0.7–4.0)
MCHC: 33.5 g/dL (ref 30.0–36.0)
MCV: 88.9 fl (ref 78.0–100.0)
Monocytes Absolute: 0.6 10*3/uL (ref 0.1–1.0)
Monocytes Relative: 6.9 % (ref 3.0–12.0)
Neutro Abs: 5.7 10*3/uL (ref 1.4–7.7)
Neutrophils Relative %: 71.4 % (ref 43.0–77.0)
Platelets: 253 10*3/uL (ref 150.0–400.0)
RBC: 4.87 Mil/uL (ref 3.87–5.11)
RDW: 14.9 % (ref 11.5–15.5)
WBC: 8 10*3/uL (ref 4.0–10.5)

## 2020-09-04 LAB — HEMOGLOBIN A1C: Hgb A1c MFr Bld: 7.9 % — ABNORMAL HIGH (ref 4.6–6.5)

## 2020-09-04 LAB — TSH: TSH: 4.89 u[IU]/mL — ABNORMAL HIGH (ref 0.35–4.50)

## 2020-09-04 MED ORDER — HYDROCODONE-ACETAMINOPHEN 5-325 MG PO TABS: ORAL_TABLET | ORAL | 0 refills | Status: DC

## 2020-09-04 MED ORDER — METHOCARBAMOL 500 MG PO TABS: ORAL_TABLET | ORAL | 3 refills | Status: DC

## 2020-09-04 MED ORDER — TETANUS-DIPHTH-ACELL PERTUSSIS 5-2-15.5 LF-MCG/0.5 IM SUSP: 0.5000 mL | Freq: Once | INTRAMUSCULAR | 0 refills | Status: AC

## 2020-09-04 NOTE — Addendum Note (Signed)
Addended by: Deveron Furlong D on: 09/04/2020 09:57 AM   Modules accepted: Orders

## 2020-09-04 NOTE — Patient Instructions (Signed)
Contact Dr. Liliane Channel office to set up repeat colonoscopy for colon cancer screening. [phone]

## 2020-09-05 ENCOUNTER — Telehealth: Payer: Self-pay | Admitting: Family Medicine

## 2020-09-05 ENCOUNTER — Other Ambulatory Visit: Payer: Self-pay | Admitting: Family Medicine

## 2020-09-05 MED ORDER — ATORVASTATIN CALCIUM 20 MG PO TABS: 20.0000 mg | ORAL_TABLET | Freq: Every day | ORAL | 3 refills | Status: DC

## 2020-09-05 NOTE — Telephone Encounter (Signed)
Spoke with pt and advised of results/recommendations.

## 2020-09-05 NOTE — Telephone Encounter (Signed)
Patient returned call for lab results. Please call patient to advise.

## 2020-09-08 ENCOUNTER — Other Ambulatory Visit: Payer: Self-pay | Admitting: Family Medicine

## 2020-09-08 DIAGNOSIS — E2839 Other primary ovarian failure: Secondary | ICD-10-CM

## 2020-10-02 ENCOUNTER — Encounter: Payer: Self-pay | Admitting: Family Medicine

## 2020-10-02 ENCOUNTER — Other Ambulatory Visit: Payer: Self-pay

## 2020-10-02 ENCOUNTER — Ambulatory Visit (INDEPENDENT_AMBULATORY_CARE_PROVIDER_SITE_OTHER): Payer: Medicare HMO | Admitting: Family Medicine

## 2020-10-02 ENCOUNTER — Ambulatory Visit: Payer: Medicare HMO | Admitting: Family Medicine

## 2020-10-02 VITALS — BP 118/70 | HR 66 | Temp 97.1°F | Resp 17 | Ht 67.5 in | Wt 255.0 lb

## 2020-10-02 DIAGNOSIS — L03113 Cellulitis of right upper limb: Secondary | ICD-10-CM | POA: Diagnosis not present

## 2020-10-02 MED ORDER — DOXYCYCLINE HYCLATE 100 MG PO TABS
100.0000 mg | ORAL_TABLET | Freq: Two times a day (BID) | ORAL | 0 refills | Status: DC
Start: 2020-10-02 — End: 2020-10-09

## 2020-10-02 NOTE — Progress Notes (Signed)
   Subjective:    Patient ID: Claudia [name], female    DOB: 12/08/1955, 65 y.o.   MRN: [id number]  HPI Cut on hand- pt reports she hit the back of her R hand on the doorknob ~2 weeks ago.  Pt cleaned area w/ peroxide and then applied triple abx ointment.  Scab came off ~1 week ago but then 3-4 days ago developed redness and swelling of hand.  Review of Systems For ROS see HPI   This visit occurred during the SARS-CoV-2 public health emergency.  Safety protocols were in place, including screening questions prior to the visit, additional usage of staff PPE, and extensive cleaning of exam room while observing appropriate contact time as indicated for disinfecting solutions.       Objective:   Physical Exam Vitals reviewed.  Constitutional:      General: She is not in acute distress.    Appearance: Normal appearance. She is not ill-appearing.  HENT:     Head: Normocephalic and atraumatic.  Musculoskeletal:        General: Swelling (swelling of index and middle MCP joints on R) present.  Skin:    General: Skin is warm and dry.     Findings: Erythema (redness extending from index MCP joint down dorsum of hand 2") and lesion (open area of R index MCP joint w/ surrounding erythema ) present.  Neurological:     General: No focal deficit present.     Mental Status: She is alert and oriented to person, place, and time.  Psychiatric:        Mood and Affect: Mood normal.        Behavior: Behavior normal.        Thought Content: Thought content normal.           Assessment & Plan:  R hand cellulitis- new.  Pt does have some surrounding/extending erythema which is new since late last week.  Given her diabetes, will expand coverage and start Doxycycline.  Encouraged her to monitor the spreading redness- daughter is a [employer] scientist and will monitor situation.  She is scheduled for Tdap this afternoon.  Encouraged her to proceed.  Reviewed supportive care and red flags that should prompt return.  Pt  expressed understanding and is in agreement w/ plan.

## 2020-10-02 NOTE — Patient Instructions (Signed)
Follow up as needed or as scheduled Clean twice daily w/ peroxide START the Doxycycline twice daily- take w/ food Watch the redness.  If it continues to spread or continues to swell, let us know ICE to help w/ the swelling on the back of the hand Call with any questions or concerns Hang in there!!!

## 2020-10-05 ENCOUNTER — Encounter: Payer: Self-pay | Admitting: Family Medicine

## 2020-10-05 ENCOUNTER — Other Ambulatory Visit: Payer: Self-pay

## 2020-10-05 ENCOUNTER — Ambulatory Visit (INDEPENDENT_AMBULATORY_CARE_PROVIDER_SITE_OTHER): Payer: Medicare HMO | Admitting: Family Medicine

## 2020-10-05 VITALS — BP 116/70 | HR 78 | Temp 97.4°F | Resp 17 | Ht 67.0 in | Wt 255.0 lb

## 2020-10-05 DIAGNOSIS — S6991XD Unspecified injury of right wrist, hand and finger(s), subsequent encounter: Secondary | ICD-10-CM | POA: Diagnosis not present

## 2020-10-05 DIAGNOSIS — L03113 Cellulitis of right upper limb: Secondary | ICD-10-CM | POA: Diagnosis not present

## 2020-10-05 DIAGNOSIS — M79641 Pain in right hand: Secondary | ICD-10-CM | POA: Insufficient documentation

## 2020-10-05 DIAGNOSIS — S6991XS Unspecified injury of right wrist, hand and finger(s), sequela: Secondary | ICD-10-CM

## 2020-10-05 NOTE — Progress Notes (Signed)
   Subjective:    Patient ID: Claudia [name], female    DOB: 1955-08-20, 65 y.o.   MRN: [id number]  HPI Swollen knuckle- pt was seen on 2/21 for cellulitis and swelling of R hand after hitting it on the back of a door.  Started on Doxycycline.  Since then, redness and pain of abrasion have improved but middle MCP joint on R hand is now much more red, swollen, and tender.   Review of Systems For ROS see HPI   This visit occurred during the SARS-CoV-2 public health emergency.  Safety protocols were in place, including screening questions prior to the visit, additional usage of staff PPE, and extensive cleaning of exam room while observing appropriate contact time as indicated for disinfecting solutions.       Objective:   Physical Exam Vitals reviewed.  Constitutional:      General: She is not in acute distress.    Appearance: Normal appearance. She is not ill-appearing.  HENT:     Head: Normocephalic and atraumatic.  Cardiovascular:     Pulses: Normal pulses.  Musculoskeletal:        General: Swelling (localized soft tissue swelling overlying R middle MCP joint only minimally TTP) present. Normal range of motion.  Skin:    General: Skin is warm and dry.     Findings: Erythema (redness overlying localized swelling of R middle MCP joint) and lesion (wound over R index MCP joint now scabbed and healing well) present.  Neurological:     General: No focal deficit present.     Mental Status: She is alert and oriented to person, place, and time.  Psychiatric:        Mood and Affect: Mood normal.        Behavior: Behavior normal.        Thought Content: Thought content normal.           Assessment & Plan:  Hand injury- the wound over the R index MCP joint is healing well and looks much better.  No longer oozing and no redness spreading over the back of the hand since starting Doxy.  However the swelling and redness of the middle MCP is much more pronounced.  Surprisingly this is  minimally TTP and she has good ROM.  Area is not warm to touch and lack of TTP makes gout less likely.  Since she is already on Doxy for infxn, will refer to hand specialist for complete evaluation and treatment.  Pt expressed understanding and is in agreement w/ plan.

## 2020-10-05 NOTE — Patient Instructions (Addendum)
Go to [facility] for an appt [facility] Continue to ICE Finish the Doxycycline as prescribed- take w/ food Call with any questions or concerns Hang in there!!

## 2020-10-09 ENCOUNTER — Telehealth: Payer: Self-pay | Admitting: Family Medicine

## 2020-10-09 MED ORDER — AMOXICILLIN-POT CLAVULANATE 875-125 MG PO TABS: 1.0000 | ORAL_TABLET | Freq: Two times a day (BID) | ORAL | 0 refills | Status: DC

## 2020-10-09 NOTE — Telephone Encounter (Signed)
Called patient and informed her per your order to take the Augmentin with food and if it gets worse then she needs be seen ASAP. Patient understood. No further concerns at this time.

## 2020-10-09 NOTE — Telephone Encounter (Signed)
Pt saw Dr. Birdie Riddle 10/05/20 for an infected place on her hand. She did go to [facility] last week, they stated nothing is broken that she just has an infection in her hand. She did Finish the Doxycycline but the place is still red and swollen. The orthopedic told pt to see Dr. Birdie Riddle to see if she would call in another antibiotic.  Pt uses [facility].    Please call pt to let her know if this can be sent in or not.

## 2020-10-09 NOTE — Telephone Encounter (Signed)
I will send Augmentin for pt.  She needs to take w/ food.  And if no improvement and especially if worsening, needs to be seen ASAP.

## 2020-10-09 NOTE — Telephone Encounter (Signed)
[place] for another round of antibiotics?

## 2020-10-13 IMAGING — DX DG HIP (WITH OR WITHOUT PELVIS) 2-3V*R*
3 series · 3 of 3 positions shown · non-contrast
Comparison: None.

CLINICAL DATA: Hip pain.  Mechanical fall

EXAM:
DG HIP (WITH OR WITHOUT PELVIS) 2-3V RIGHT

[pelvis ap]
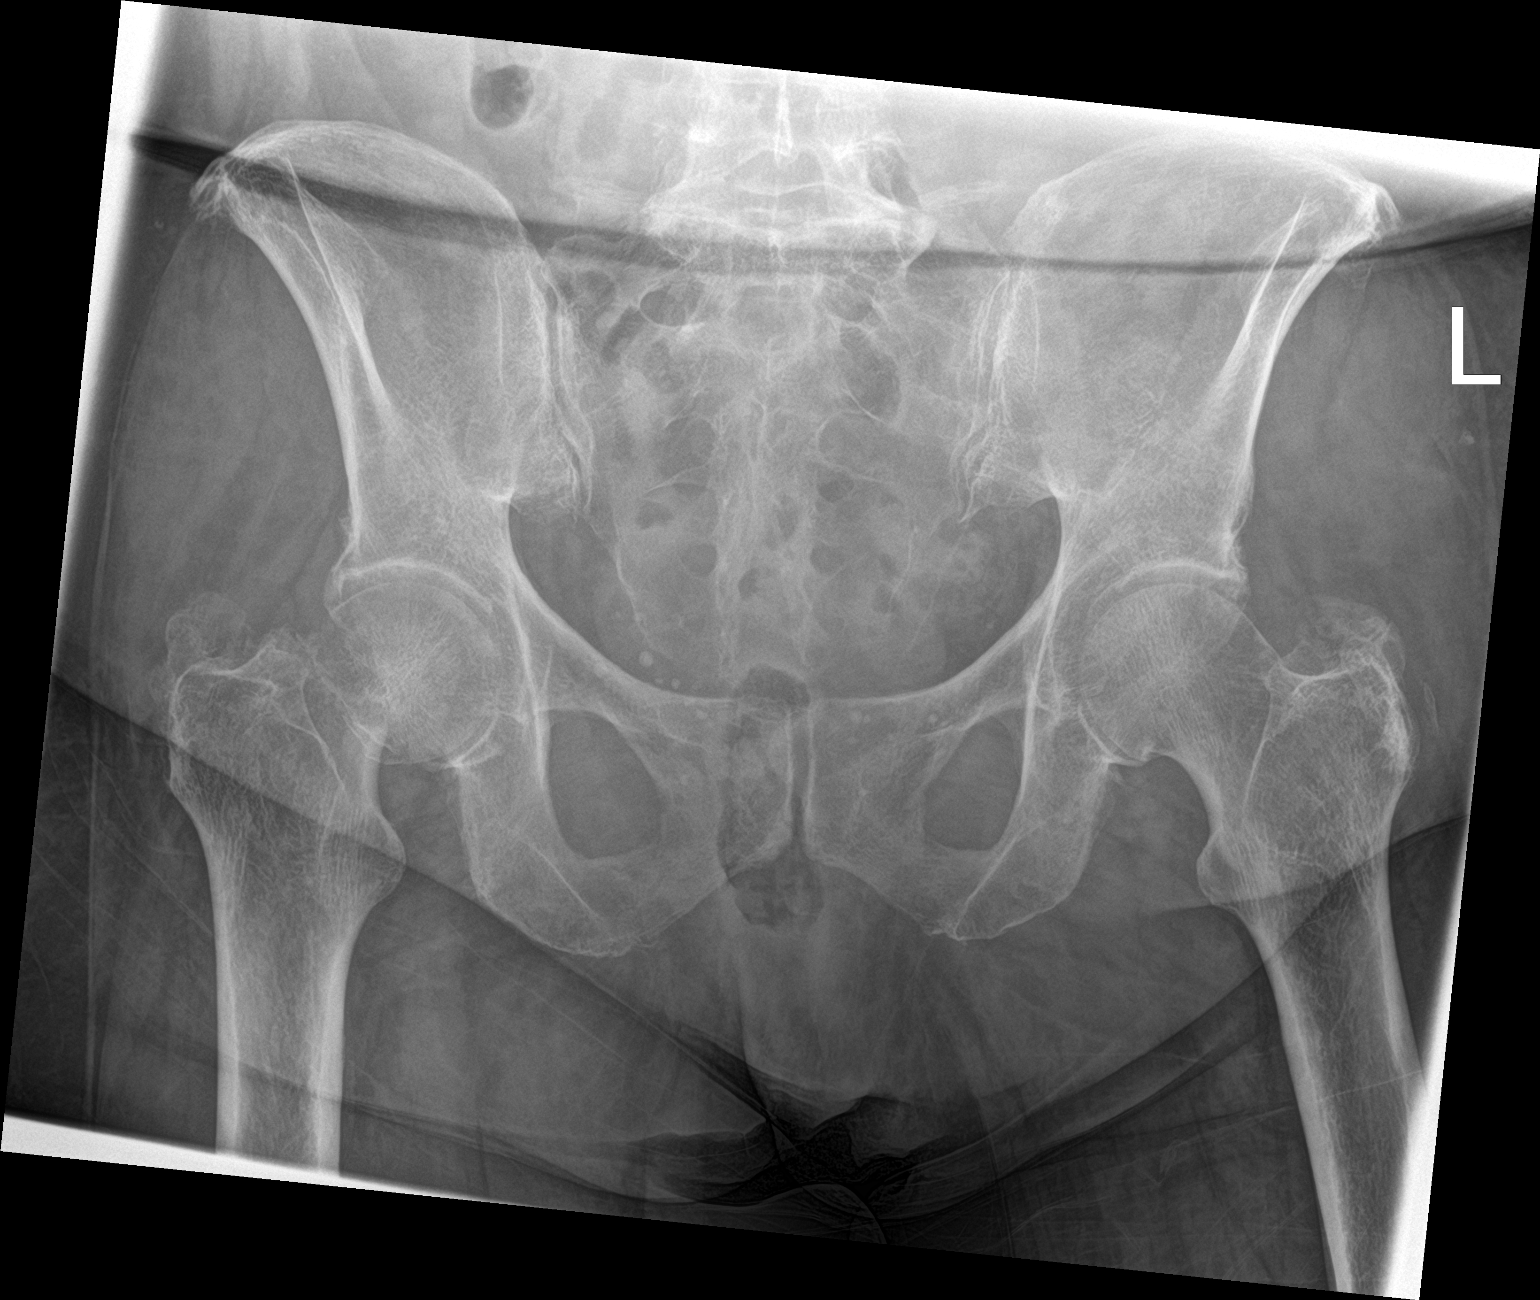

[hip lat]
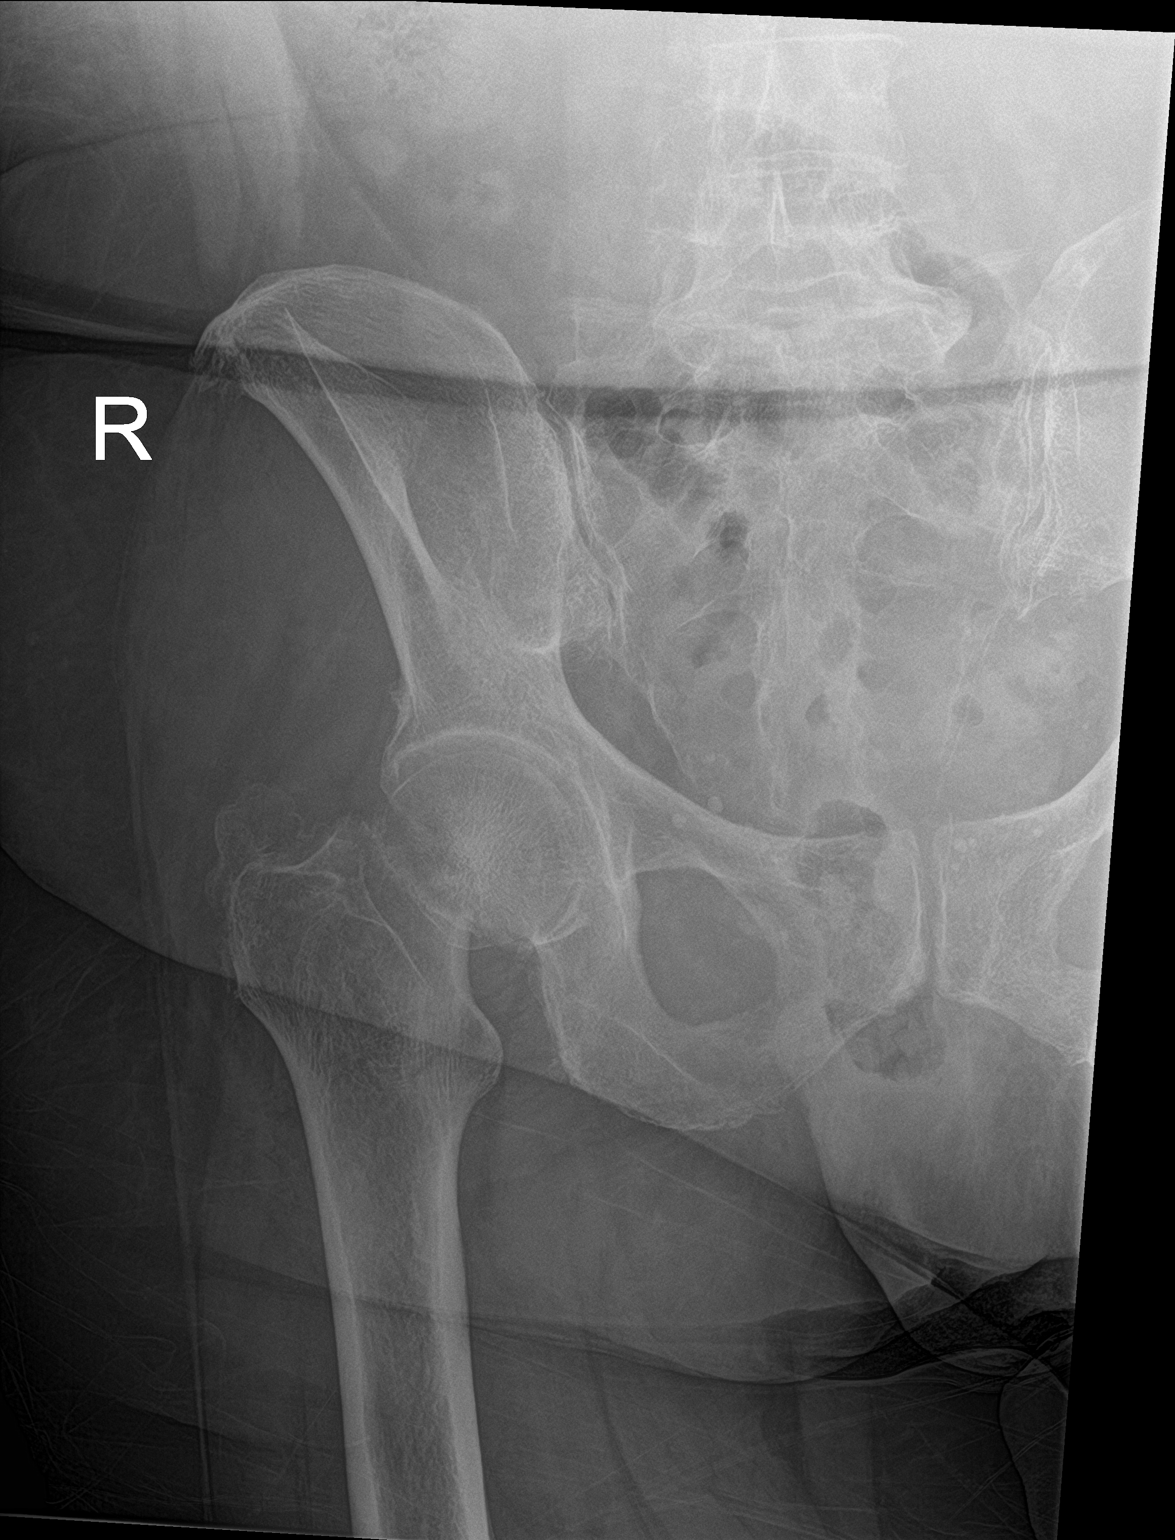

[hip x-table]
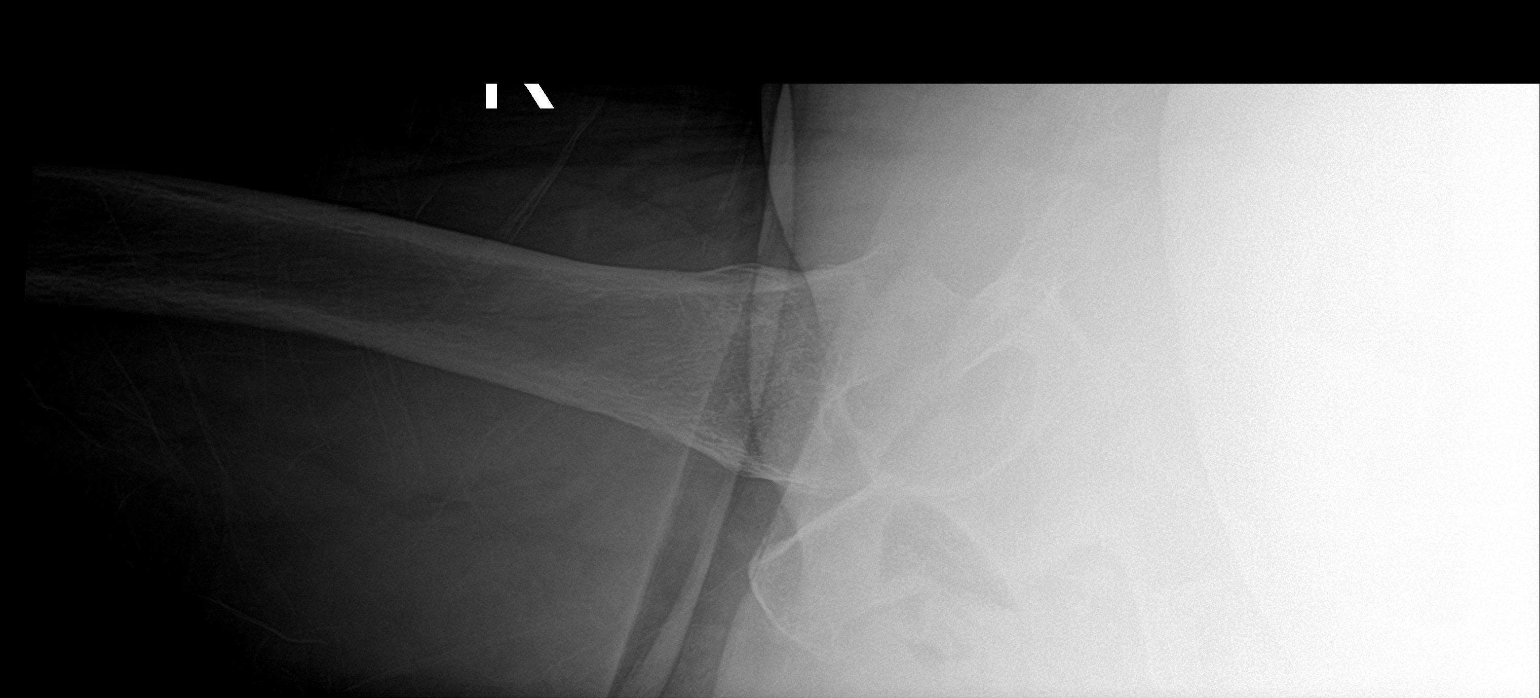

[3 of 3 positions shown; findings below may reference images not displayed]

FINDINGS: Fracture through the subcapital RIGHT femoral neck. Minimal
angulation. Mild vertical migration. No dislocation.
IMPRESSION: RIGHT subcapital femoral neck fracture.

## 2020-10-14 IMAGING — RF DG C-ARM 1-60 MIN
1 series · 4 of 4 positions shown · non-contrast
Comparison: None.

CLINICAL DATA: Right hip arthroplasty

EXAM:
OPERATIVE RIGHT HIP (WITH PELVIS IF PERFORMED) 4 VIEWS
TECHNIQUE: Fluoroscopic spot image(s) were submitted for interpretation
post-operatively.

[Series 1: run · 4 of 4 slices shown]
[im 1/4]
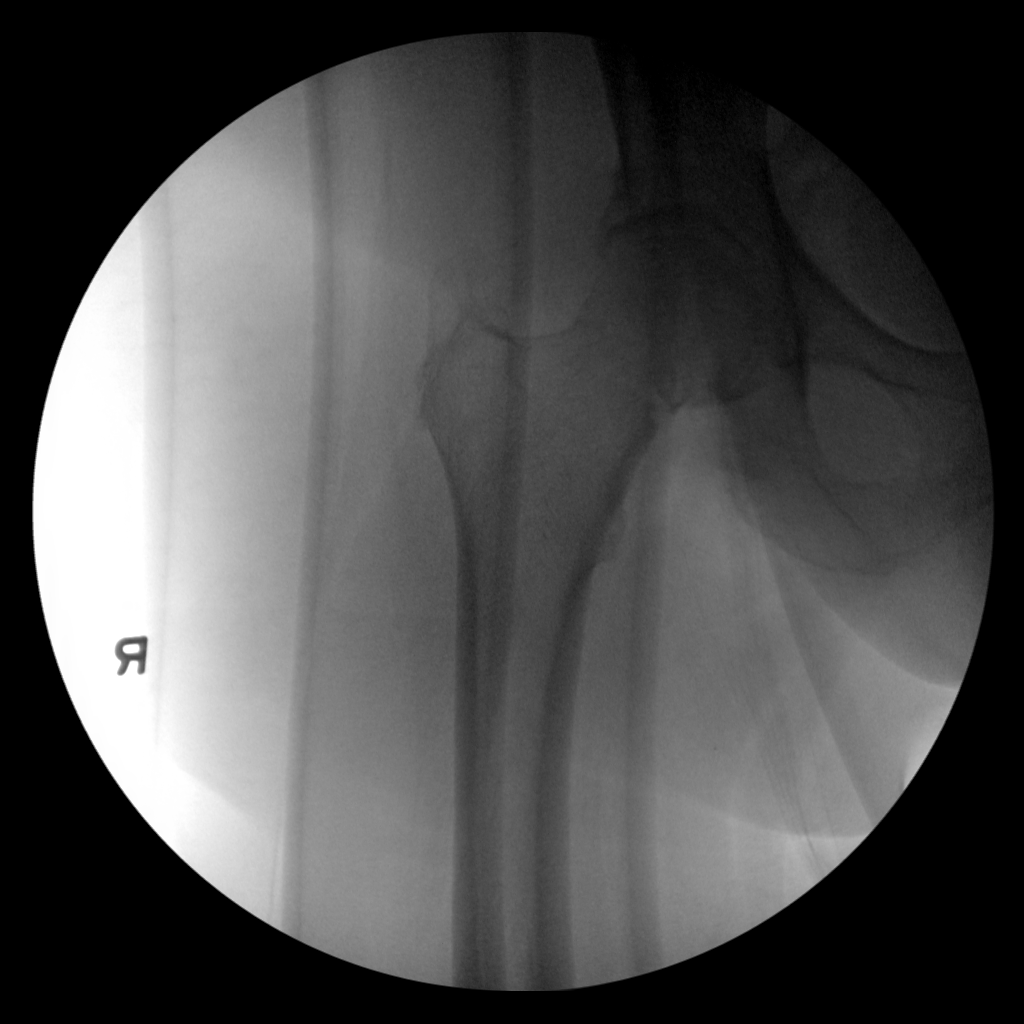
[im 2/4]
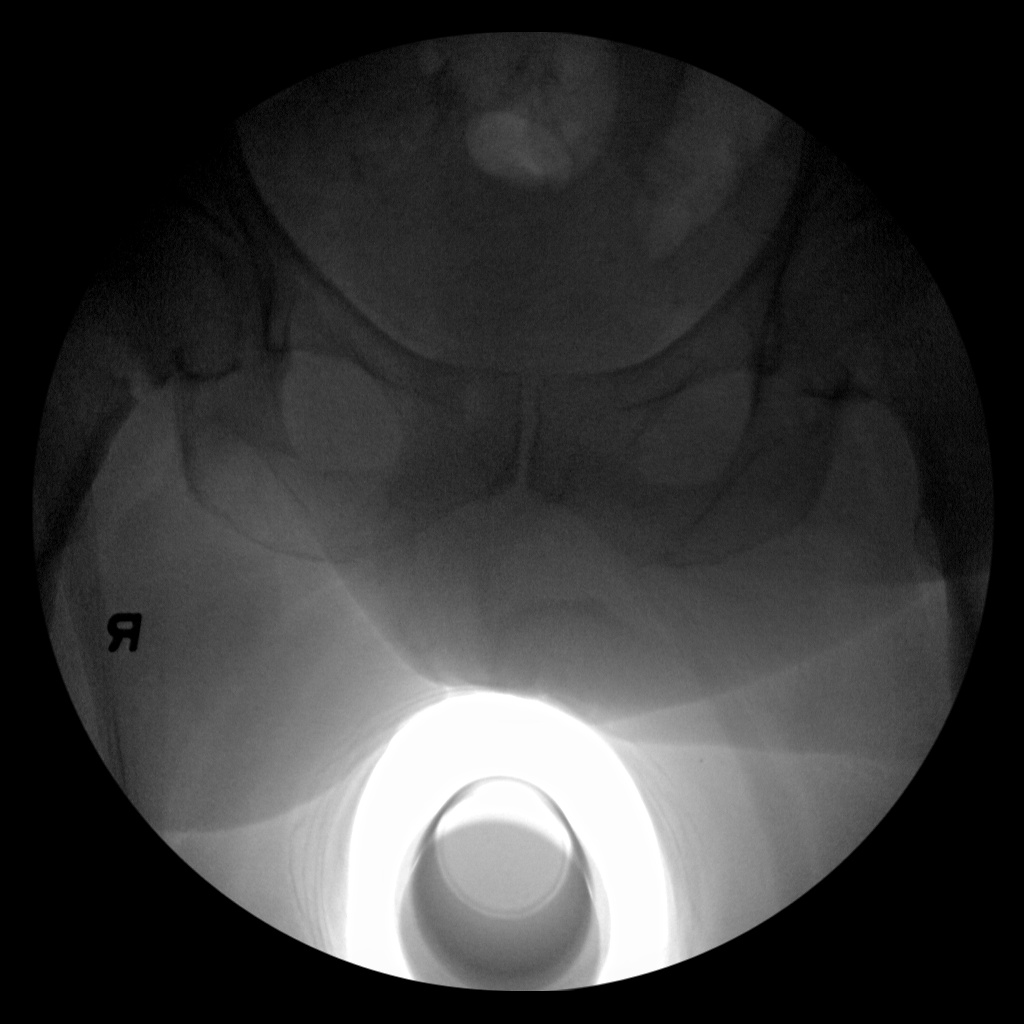
[im 3/4]
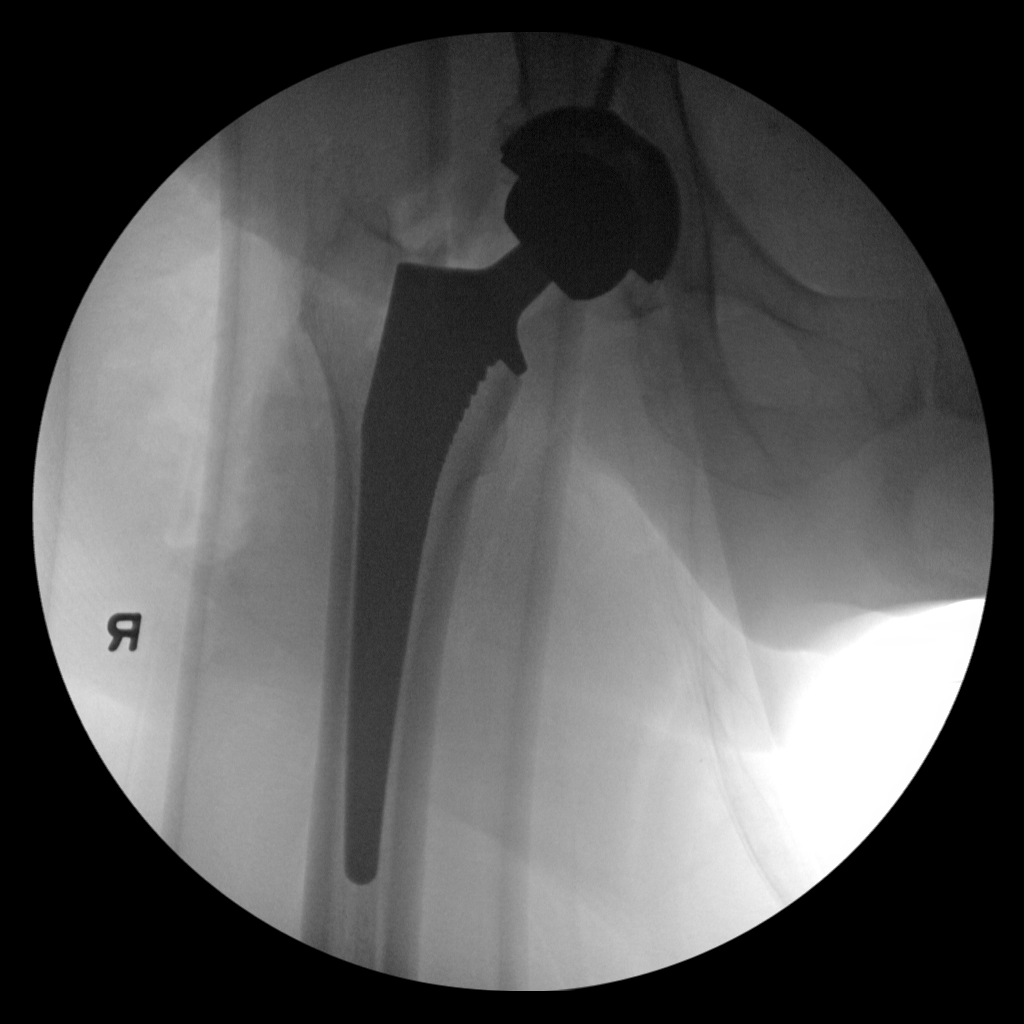
[im 4/4]
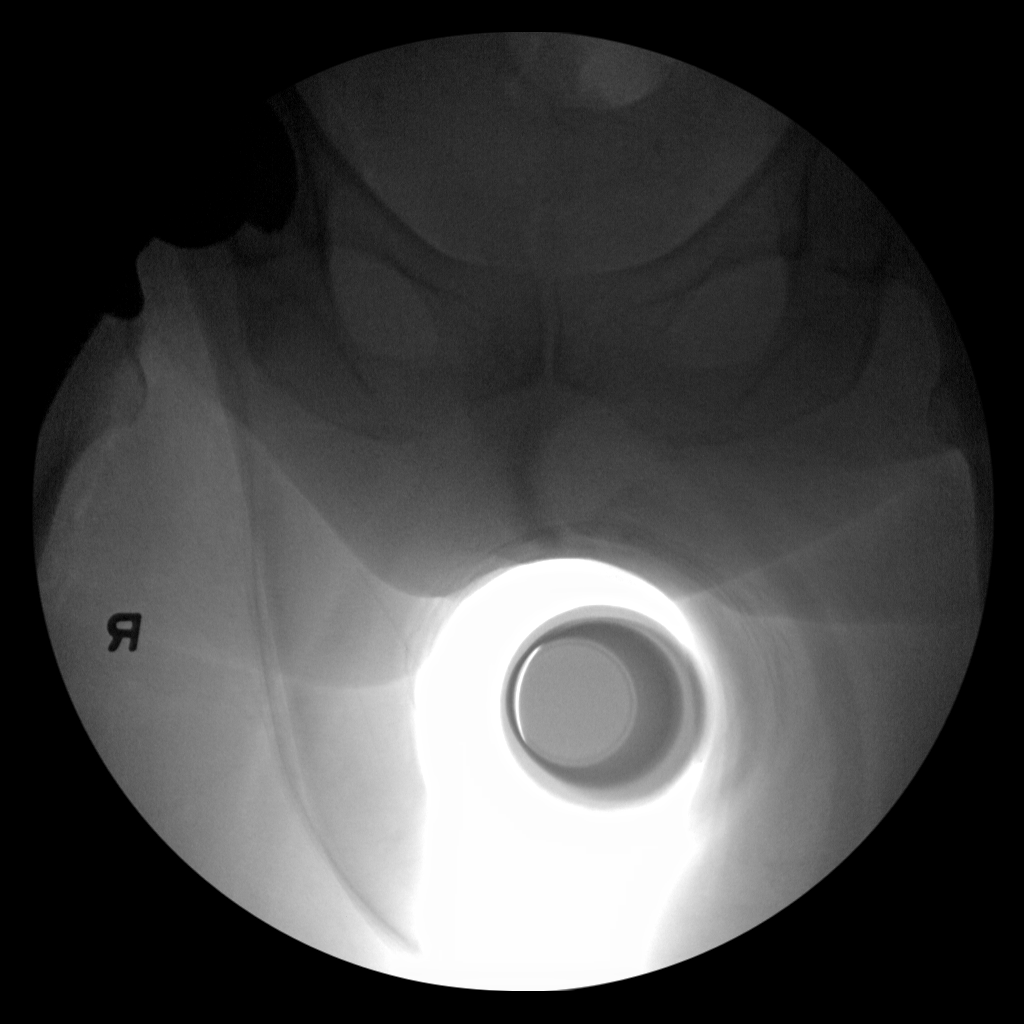

[4 of 4 positions shown; findings below may reference images not displayed]

FINDINGS: Intraoperative fluoroscopic images are provided. Interval right hip
arthroplasty.
IMPRESSION: Intraoperative localization.

## 2020-10-25 DIAGNOSIS — M79641 Pain in right hand: Secondary | ICD-10-CM | POA: Diagnosis not present

## 2020-10-27 ENCOUNTER — Ambulatory Visit: Payer: Medicare HMO | Admitting: Family Medicine

## 2020-10-27 ENCOUNTER — Encounter: Payer: Self-pay | Admitting: Family Medicine

## 2020-10-28 ENCOUNTER — Other Ambulatory Visit: Payer: Self-pay | Admitting: Surgical

## 2020-10-31 ENCOUNTER — Other Ambulatory Visit: Payer: Self-pay | Admitting: Family Medicine

## 2020-10-31 NOTE — Telephone Encounter (Signed)
Pt advised refill sent. °

## 2020-10-31 NOTE — Telephone Encounter (Signed)
Requesting: [employer] Contract: 12/17/18 UDS: 11/06/17 Last Visit: 09/04/20 Next Visit: 12/04/20 Last Refill: 09/04/20(90,0)  Please Advise. Medication pending

## 2020-11-20 ENCOUNTER — Telehealth (INDEPENDENT_AMBULATORY_CARE_PROVIDER_SITE_OTHER): Payer: Medicare HMO | Admitting: Family Medicine

## 2020-11-20 DIAGNOSIS — J019 Acute sinusitis, unspecified: Secondary | ICD-10-CM
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
## 2020-11-24 ENCOUNTER — Other Ambulatory Visit: Payer: Self-pay | Admitting: Family Medicine

## 2020-11-27 ENCOUNTER — Telehealth: Payer: Self-pay

## 2020-11-27 ENCOUNTER — Other Ambulatory Visit: Payer: Self-pay | Admitting: Family Medicine

## 2020-11-27 MED ORDER — AZITHROMYCIN 250 MG PO TABS: ORAL_TABLET | ORAL | 0 refills | Status: DC

## 2020-11-27 MED ORDER — PREDNISONE 20 MG PO TABS: ORAL_TABLET | ORAL | 0 refills | Status: DC

## 2020-11-27 NOTE — Telephone Encounter (Signed)
Patient had virtual appt 4/11. She now has a cough. Requesting another round of antibiotics and cough meds.  Patient does not want another office visit. Patient prefers Dr. Anitra Lauth suggestions on what to do next or approve request as mentioned. Patient can be reached at [phone]

## 2020-11-27 NOTE — Telephone Encounter (Signed)
Requesting: [employer] Contract: 12/17/18 UDS: 11/06/17 Last Visit: 09/04/20 Next Visit: 12/04/20 Last Refill: 10/31/20(90,0)   Please Advise. Medication pending

## 2020-11-27 NOTE — Telephone Encounter (Signed)
Pt declined appt. Per last note pt is to f/u in 3-4 days if not improving. Please advise

## 2020-11-27 NOTE — Telephone Encounter (Signed)
Z pack and prednisone eRx'd
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
## 2020-12-04 ENCOUNTER — Ambulatory Visit (INDEPENDENT_AMBULATORY_CARE_PROVIDER_SITE_OTHER): Payer: Medicare HMO | Admitting: Family Medicine

## 2020-12-04 ENCOUNTER — Other Ambulatory Visit: Payer: Self-pay

## 2020-12-04 ENCOUNTER — Encounter: Payer: Self-pay | Admitting: Family Medicine

## 2020-12-04 VITALS — BP 96/60 | HR 57 | Temp 97.5°F | Resp 16 | Ht 67.0 in | Wt 252.2 lb

## 2020-12-04 DIAGNOSIS — G894 Chronic pain syndrome: Secondary | ICD-10-CM | POA: Diagnosis not present

## 2020-12-04 DIAGNOSIS — J301 Allergic rhinitis due to pollen: Secondary | ICD-10-CM

## 2020-12-04 DIAGNOSIS — I10 Essential (primary) hypertension: Secondary | ICD-10-CM

## 2020-12-04 DIAGNOSIS — Z79899 Other long term (current) drug therapy: Secondary | ICD-10-CM

## 2020-12-04 DIAGNOSIS — E7800 Pure hypercholesterolemia, unspecified: Secondary | ICD-10-CM

## 2020-12-04 DIAGNOSIS — E118 Type 2 diabetes mellitus with unspecified complications: Secondary | ICD-10-CM | POA: Diagnosis not present

## 2020-12-04 DIAGNOSIS — J329 Chronic sinusitis, unspecified: Secondary | ICD-10-CM | POA: Diagnosis not present

## 2020-12-04 LAB — LIPID PANEL
Cholesterol: 134 mg/dL (ref 0–200)
HDL: 56.5 mg/dL (ref >39.00)
LDL Cholesterol: 51 mg/dL (ref 0–99)
NonHDL: 77.17
Total CHOL/HDL Ratio: 2
Triglycerides: 131 mg/dL (ref 0.0–149.0)
VLDL: 26.2 mg/dL (ref 0.0–40.0)

## 2020-12-04 LAB — COMPREHENSIVE METABOLIC PANEL
ALT: 16 U/L (ref 0–35)
AST: 14 U/L (ref 0–37)
Albumin: 3.7 g/dL (ref 3.5–5.2)
Alkaline Phosphatase: 163 U/L — ABNORMAL HIGH (ref 39–117)
BUN: 12 mg/dL (ref 6–23)
CO2: 27 mEq/L (ref 19–32)
Calcium: 9.5 mg/dL (ref 8.4–10.5)
Chloride: 98 mEq/L (ref 96–112)
Creatinine, Ser: 0.91 mg/dL (ref 0.40–1.20)
GFR: 66.22 mL/min (ref >60.00)
Glucose, Bld: 232 mg/dL — ABNORMAL HIGH (ref 70–99)
Potassium: 4.7 mEq/L (ref 3.5–5.1)
Sodium: 132 mEq/L — ABNORMAL LOW (ref 135–145)
Total Bilirubin: 0.9 mg/dL (ref 0.2–1.2)
Total Protein: 7 g/dL (ref 6.0–8.3)

## 2020-12-04 LAB — HEMOGLOBIN A1C: Hgb A1c MFr Bld: 9.3 % — ABNORMAL HIGH (ref 4.6–6.5)

## 2020-12-04 MED ORDER — HYDROCODONE-ACETAMINOPHEN 5-325 MG PO TABS: ORAL_TABLET | ORAL | 0 refills | Status: DC

## 2020-12-04 MED ORDER — TRAMADOL HCL 50 MG PO TABS: ORAL_TABLET | ORAL | 5 refills | Status: DC

## 2020-12-04 MED ORDER — MONTELUKAST SODIUM 10 MG PO TABS: 10.0000 mg | ORAL_TABLET | Freq: Every day | ORAL | 11 refills | Status: DC

## 2020-12-05 ENCOUNTER — Encounter: Payer: Self-pay | Admitting: Family Medicine

## 2020-12-05 NOTE — Telephone Encounter (Signed)
OK to continue with current oral meds and try to improve diet. Her oral medications are maxed out and the only other thing I have to possibly add is insulin.  If next a1c not improved then we'll need to discuss insulin.

## 2020-12-05 NOTE — Telephone Encounter (Signed)
Pt advised during lab results to "Have her call her insurer and check on coverage for Trulicity, victoza, or ozempic.  Call and let us know and i'll do rx for whichever one they cover"   Please see below.

## 2020-12-07 LAB — DRUG MONITORING, PANEL 8 WITH CONFIRMATION, URINE
6 Acetylmorphine: NEGATIVE ng/mL (ref <10)
Alcohol Metabolites: NEGATIVE ng/mL
Amphetamines: NEGATIVE ng/mL (ref <500)
Benzodiazepines: NEGATIVE ng/mL (ref <100)
Buprenorphine, Urine: NEGATIVE ng/mL (ref <5)
Cocaine Metabolite: NEGATIVE ng/mL (ref <150)
Codeine: NEGATIVE ng/mL (ref <50)
Creatinine: 33.8 mg/dL
Hydrocodone: 92 ng/mL — ABNORMAL HIGH (ref <50)
Hydromorphone: NEGATIVE ng/mL (ref <50)
MDMA: NEGATIVE ng/mL (ref <500)
Marijuana Metabolite: NEGATIVE ng/mL (ref <20)
Morphine: NEGATIVE ng/mL (ref <50)
Norhydrocodone: 142 ng/mL — ABNORMAL HIGH (ref <50)
Opiates: POSITIVE ng/mL — AB (ref <100)
Oxidant: NEGATIVE ug/mL
Oxycodone: NEGATIVE ng/mL (ref <100)
pH: 6.2 (ref 4.5–9.0)

## 2020-12-07 LAB — DM TEMPLATE

## 2020-12-12 LAB — HM DIABETES EYE EXAM

## 2020-12-14 ENCOUNTER — Encounter: Payer: Self-pay | Admitting: Family Medicine

## 2021-01-01 ENCOUNTER — Other Ambulatory Visit: Payer: Self-pay | Admitting: Family Medicine

## 2021-01-16 ENCOUNTER — Other Ambulatory Visit: Payer: Medicare HMO

## 2021-02-15 ENCOUNTER — Telehealth: Payer: Self-pay

## 2021-02-15 NOTE — Telephone Encounter (Signed)
A user error has taken place: encounter opened in error, closed for administrative reasons.

## 2021-02-21 ENCOUNTER — Other Ambulatory Visit: Payer: Self-pay | Admitting: Family Medicine

## 2021-02-27 ENCOUNTER — Other Ambulatory Visit: Payer: Self-pay | Admitting: Family Medicine

## 2021-03-05 ENCOUNTER — Ambulatory Visit (INDEPENDENT_AMBULATORY_CARE_PROVIDER_SITE_OTHER): Payer: Medicare HMO | Admitting: Family Medicine

## 2021-03-05 ENCOUNTER — Other Ambulatory Visit: Payer: Self-pay

## 2021-03-05 ENCOUNTER — Encounter: Payer: Self-pay | Admitting: Family Medicine

## 2021-03-05 ENCOUNTER — Ambulatory Visit: Payer: Medicare HMO | Admitting: Family Medicine

## 2021-03-05 ENCOUNTER — Other Ambulatory Visit: Payer: Self-pay | Admitting: Family Medicine

## 2021-03-05 VITALS — BP 102/66 | HR 66 | Temp 97.6°F | Resp 16 | Ht 67.0 in | Wt 247.8 lb

## 2021-03-05 DIAGNOSIS — G8929 Other chronic pain: Secondary | ICD-10-CM

## 2021-03-05 DIAGNOSIS — G894 Chronic pain syndrome: Secondary | ICD-10-CM | POA: Diagnosis not present

## 2021-03-05 DIAGNOSIS — I10 Essential (primary) hypertension: Secondary | ICD-10-CM | POA: Diagnosis not present

## 2021-03-05 DIAGNOSIS — E119 Type 2 diabetes mellitus without complications: Secondary | ICD-10-CM

## 2021-03-05 DIAGNOSIS — M79671 Pain in right foot: Secondary | ICD-10-CM

## 2021-03-05 LAB — POCT GLYCOSYLATED HEMOGLOBIN (HGB A1C)
HbA1c POC (<> result, manual entry): 9.2 % (ref 4.0–5.6)
HbA1c, POC (controlled diabetic range): 9.2 % — AB (ref 0.0–7.0)
HbA1c, POC (prediabetic range): 9.2 % — AB (ref 5.7–6.4)
Hemoglobin A1C: 9.2 % — AB (ref 4.0–5.6)

## 2021-03-05 MED ORDER — HYDROCODONE-ACETAMINOPHEN 5-325 MG PO TABS: ORAL_TABLET | ORAL | 0 refills | Status: DC

## 2021-03-05 MED ORDER — TRAMADOL HCL 50 MG PO TABS: ORAL_TABLET | ORAL | 5 refills | Status: DC
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
## 2021-03-05 NOTE — Telephone Encounter (Signed)
RF request for Diclofenac LOV: 03/05/21 Next ov: 06/05/21 Last written:05/12/19 (2g,0) original rx written by ED provider   Please advise. Med pending

## 2021-03-12 DIAGNOSIS — M810 Age-related osteoporosis without current pathological fracture: Secondary | ICD-10-CM

## 2021-03-12 HISTORY — PX: OTHER SURGICAL HISTORY: SHX169

## 2021-03-12 HISTORY — DX: Age-related osteoporosis without current pathological fracture: M81.0

## 2021-03-14 ENCOUNTER — Ambulatory Visit: Payer: Medicare HMO | Admitting: Family Medicine

## 2021-03-22 ENCOUNTER — Other Ambulatory Visit: Payer: Self-pay | Admitting: Family Medicine

## 2021-03-23 ENCOUNTER — Other Ambulatory Visit: Payer: Self-pay | Admitting: Family Medicine

## 2021-03-23 DIAGNOSIS — E2839 Other primary ovarian failure: Secondary | ICD-10-CM

## 2021-03-28 ENCOUNTER — Other Ambulatory Visit: Payer: Medicare HMO

## 2021-03-29 ENCOUNTER — Ambulatory Visit
Admission: RE | Admit: 2021-03-29 | Discharge: 2021-03-29 | Disposition: A | Discharge status: Home / Self Care | Payer: Medicare HMO | Source: Ambulatory Visit | Attending: Family Medicine | Admitting: Family Medicine

## 2021-03-29 ENCOUNTER — Other Ambulatory Visit: Payer: Self-pay

## 2021-03-29 DIAGNOSIS — M85852 Other specified disorders of bone density and structure, left thigh: Secondary | ICD-10-CM | POA: Diagnosis not present

## 2021-03-29 DIAGNOSIS — Z780 Asymptomatic menopausal state: Secondary | ICD-10-CM | POA: Diagnosis not present

## 2021-03-29 DIAGNOSIS — M810 Age-related osteoporosis without current pathological fracture: Secondary | ICD-10-CM | POA: Diagnosis not present

## 2021-03-29 DIAGNOSIS — E2839 Other primary ovarian failure: Secondary | ICD-10-CM

## 2021-04-02 ENCOUNTER — Other Ambulatory Visit: Payer: Self-pay

## 2021-04-02 ENCOUNTER — Encounter: Payer: Self-pay | Admitting: Family Medicine

## 2021-04-02 MED ORDER — ALENDRONATE SODIUM 70 MG PO TABS: 70.0000 mg | ORAL_TABLET | ORAL | 3 refills | Status: DC

## 2021-04-26 ENCOUNTER — Other Ambulatory Visit: Payer: Self-pay | Admitting: Family Medicine

## 2021-04-26 DIAGNOSIS — Z1231 Encounter for screening mammogram for malignant neoplasm of breast: Secondary | ICD-10-CM

## 2021-05-01 ENCOUNTER — Other Ambulatory Visit: Payer: Self-pay | Admitting: Family Medicine

## 2021-05-01 NOTE — Telephone Encounter (Signed)
RF request for diflucan LOV: 03/05/21 Next ov: 06/05/21 Last written: 07/16/20(10,1)  Requesting: [facility] Contract: 12/04/20 UDS: 12/04/20 Last Visit:03/05/21 Next Visit:06/05/21 Last Refill:03/05/21(90,0) electronic RF for Aug  Please review and advise, meds pending

## 2021-05-07 DIAGNOSIS — Z20822 Contact with and (suspected) exposure to covid-19: Secondary | ICD-10-CM | POA: Diagnosis not present

## 2021-05-08 ENCOUNTER — Encounter: Payer: Self-pay | Admitting: Registered Nurse

## 2021-05-08 ENCOUNTER — Telehealth (INDEPENDENT_AMBULATORY_CARE_PROVIDER_SITE_OTHER): Payer: Medicare HMO | Admitting: Registered Nurse

## 2021-05-08 ENCOUNTER — Other Ambulatory Visit: Payer: Self-pay

## 2021-05-08 VITALS — BP 119/76 | Wt 221.0 lb

## 2021-05-08 DIAGNOSIS — B9689 Other specified bacterial agents as the cause of diseases classified elsewhere: Secondary | ICD-10-CM

## 2021-05-08 DIAGNOSIS — J329 Chronic sinusitis, unspecified: Secondary | ICD-10-CM

## 2021-05-08 DIAGNOSIS — Z20822 Contact with and (suspected) exposure to covid-19: Secondary | ICD-10-CM | POA: Diagnosis not present

## 2021-05-08 MED ORDER — DOXYCYCLINE HYCLATE 100 MG PO TABS: 100.0000 mg | ORAL_TABLET | Freq: Two times a day (BID) | ORAL | 0 refills | Status: DC

## 2021-05-08 MED ORDER — BENZONATATE 200 MG PO CAPS
200.0000 mg | ORAL_CAPSULE | Freq: Two times a day (BID) | ORAL | 0 refills | Status: DC | PRN
Start: 2021-05-08 — End: 2021-05-30

## 2021-05-08 NOTE — Progress Notes (Signed)
Telemedicine Encounter- SOAP NOTE Established Patient  This telephone encounter was conducted with the patient's (or proxy's) verbal consent via audio telecommunications: yes  Patient was instructed to have this encounter in a suitably private space; and to only have persons present to whom they give permission to participate. In addition, patient identity was confirmed by use of name plus two identifiers (DOB and address).  I discussed the limitations, risks, security and privacy concerns of performing an evaluation and management service by telephone and the availability of in person appointments. I also discussed with the patient that there may be a patient responsible charge related to this service. The patient expressed understanding and agreed to proceed.  I spent a total of  14 minutes talking with the patient or their proxy.  Patient at home Provider in office  Participants: Kathrin Ruddy, NP and Dollene Cleveland  Chief Complaint  Patient presents with   Sinusitis    Patient states she has been experiencing some sinus problems for about one week. And has been taking many otc medicaticons with no relief patient has been having sinus drainage , scratchy throat and 2 negative covid test.     Subjective   Claudia [name] is a 65 y.o. established patient. Telephone visit today for sinus infection  HPI Usually has once a year Has onset around 1 week ago, maybe a little longer  Sinus pressure, pain.  Some sore throat and cough from PND No lower respiratory symptoms  2x negative covid tests. Vaccinated 2x and boosted 2x.   Patient Active Problem List   Diagnosis Date Noted   Pain in right hand 10/05/2020   Pain in joint of right ankle 10/08/2019   Fracture of phalanx of foot 10/08/2019   Closed right radial fracture 05/10/2019   Subcapital fracture of hip ([place]) 05/07/2019   Hip fracture ([place]) 05/03/2019   Essential hypertension 05/03/2019   DM (diabetes mellitus), secondary  uncontrolled ([place]) 11/06/2017   Osteoarthritis, multiple sites 10/03/2014   Insomnia 10/03/2014   Viral upper respiratory tract infection with cough 06/01/2014   Right knee pain 05/10/2013   Obesity with body mass index of 30.0-39.9 03/23/2013    Past Medical History:  Diagnosis Date   Cellulitis of hand, right 2022   Chronic pain syndrome    chronic bilat knee pain and LBP secondary to osteoarthritis.   Diabetes mellitus without complication ([place])    DVT, popliteal, acute ([place]) 05/2019   she was s/p R THA at the time.  Xarelto 05/2019 to 11/05/19.   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 Take with a full glass of water on  an empty stomach. 12 tablet 3   atorvastatin (LIPITOR) 20 MG tablet TAKE 1 TABLET DAILY 90 tablet 0   benzonatate (TESSALON) 200 MG capsule Take 1 capsule (200 mg total) by mouth 2 (two) times daily as needed for cough. 20 capsule 0   Blood Glucose Monitoring Suppl (ACCU-CHEK GUIDE) w/Device KIT Accu-Chek Guide Glucose Meter  USE AS DIRECTED     canagliflozin (INVOKANA) 300 MG TABS tablet Take 1 tablet (300 mg total) by mouth daily. 90 tablet 3   diclofenac Sodium (VOLTAREN) 1 % GEL APPLY 2 GRAMS TO AFFECTED  AREAS TWICE A DAY 100 g 2   doxycycline (VIBRA-TABS) 100 MG tablet Take 1 tablet (100 mg total) by mouth 2 (two) times daily. 20 tablet 0   fluconazole (DIFLUCAN) 150 MG tablet TAKE 1 TABLET DAILY 10 tablet 1   fluticasone (FLONASE) 50 MCG/ACT nasal spray USE 2 SPRAYS IN EACH NOSTRIL DAILY. 48 g 2   gabapentin (NEURONTIN) 100 MG capsule Take 100 mg by mouth at bedtime.     glipiZIDE (GLUCOTROL) 10 MG tablet 1 tab po bid 180 tablet 3   HYDROcodone-acetaminophen (NORCO/VICODIN) 5-325 MG tablet TAKE 1 TABLET 3 TIMES A DAY AS NEEDED FOR MODERATE PAIN 90 tablet 0   lisinopril (ZESTRIL) 5 MG tablet Take 1 tablet (5 mg total) by mouth daily. 90 tablet 3   meloxicam (MOBIC) 15 MG tablet TAKE 1 TABLET DAILY FOR PAIN 30 tablet 2   methocarbamol (ROBAXIN) 500 MG tablet 1-2 tabs po tid prn 30 tablet 3   montelukast (SINGULAIR) 10 MG tablet Take 1 tablet (10 mg total) by mouth at bedtime. 30 tablet 11   pioglitazone (ACTOS) 45 MG tablet Take 1 tablet (45 mg total) by mouth daily. 90 tablet 5   Polyethylene Glycol 3350 (MIRALAX PO) Take by mouth as needed.     traMADol (ULTRAM) 50 MG tablet TAKE 1 TO 2 TABLETS BY MOUTH TWICE A DAY AS NEEDED FOR PAIN 90 tablet 5   No current facility-administered medications for this visit.    Allergies  Allergen Reactions   Metformin And Related Nausea And Vomiting and Other (See Comments)    GI upset    Social History   Socioeconomic History   Marital status: Divorced    Spouse name: Not on file   Number of children: Not on file   Years of education: Not on file   Highest education level: Not on file  Occupational History   Not on file  Tobacco Use   Smoking status: Never   Smokeless tobacco: Never  Substance and Sexual Activity   Alcohol use: No   Drug use: No   Sexual activity: Not Currently  Other Topics Concern   Not on file  Social History Narrative   Divorced since 2005.  Two children.   Orig from [place], [place]--currently lives there.    College at [employer].   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   No T/A/Ds.   Exercise: walking   Social Determinants of Health   Financial Resource Strain: Not on file  Food Insecurity: Not on file  Transportation Needs: Not on file  Physical Activity: Not on file  Stress: Not on file  Social Connections: Not on file  Intimate Partner Violence: Not on file    Review of Systems  Constitutional: Negative.   HENT:  Positive for congestion, sinus pain and sore throat. Negative for ear discharge, ear pain, hearing loss, nosebleeds and tinnitus.   Eyes: Negative.  Respiratory: Negative.  Negative for stridor.   Cardiovascular: Negative.   Gastrointestinal: Negative.   Genitourinary: Negative.   Musculoskeletal: Negative.   Skin: Negative.   Neurological: Negative.   Endo/Heme/Allergies: Negative.   Psychiatric/Behavioral: Negative.    All other systems reviewed and are negative.  Objective   Vitals as reported by the patient: Today's Vitals   05/08/21 1208  BP: 119/76  Weight: 221 lb (100.2 kg)    Aidee was seen today for sinusitis.  Diagnoses and all orders for this visit:  Bacterial sinusitis -     doxycycline (VIBRA-TABS) 100 MG tablet; Take 1 tablet (100 mg total) by mouth 2 (two) times daily. -     benzonatate (TESSALON) 200 MG capsule; Take 1 capsule (200 mg total) by mouth 2 (two) times daily as needed for cough.   PLAN Seems as bacterial sinusitis Doxycycline as above Tessalon pearls for cough Return precautions reviewed Patient encouraged to call clinic with any questions, comments, or concerns.  I discussed the assessment and treatment plan with the patient. The patient was provided an opportunity to ask questions and all were answered. The patient agreed with the plan and demonstrated an understanding of the instructions.   The patient was advised to call back or seek an in-person evaluation if the symptoms worsen or if the condition fails to improve as  anticipated.  I provided 17 minutes of non-face-to-face time during this encounter.  Maximiano Coss, NP

## 2021-05-09 DIAGNOSIS — Z20822 Contact with and (suspected) exposure to covid-19: Secondary | ICD-10-CM | POA: Diagnosis not present

## 2021-05-12 DIAGNOSIS — E041 Nontoxic single thyroid nodule: Secondary | ICD-10-CM

## 2021-05-12 HISTORY — DX: Nontoxic single thyroid nodule: E04.1

## 2021-05-21 DIAGNOSIS — Z20822 Contact with and (suspected) exposure to covid-19: Secondary | ICD-10-CM | POA: Diagnosis not present

## 2021-05-22 DIAGNOSIS — Z20822 Contact with and (suspected) exposure to covid-19: Secondary | ICD-10-CM | POA: Diagnosis not present

## 2021-05-23 ENCOUNTER — Ambulatory Visit (INDEPENDENT_AMBULATORY_CARE_PROVIDER_SITE_OTHER): Payer: Medicare HMO | Admitting: *Deleted

## 2021-05-23 DIAGNOSIS — Z0000 Encounter for general adult medical examination without abnormal findings: Secondary | ICD-10-CM | POA: Diagnosis not present

## 2021-05-23 DIAGNOSIS — Z1211 Encounter for screening for malignant neoplasm of colon: Secondary | ICD-10-CM | POA: Diagnosis not present

## 2021-05-23 DIAGNOSIS — Z20822 Contact with and (suspected) exposure to covid-19: Secondary | ICD-10-CM | POA: Diagnosis not present
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
## 2021-05-29 ENCOUNTER — Other Ambulatory Visit: Payer: Self-pay | Admitting: Family Medicine

## 2021-05-29 NOTE — Telephone Encounter (Signed)
Requesting: [employer] Contract: 12/04/20 UDS: 12/04/20 Last Visit:03/05/21 Next Visit:10/225/22 Last Refill: 05/01/21(90,0)  Please Advise. Medication pending

## 2021-05-30 ENCOUNTER — Encounter: Payer: Self-pay | Admitting: Family Medicine

## 2021-05-30 ENCOUNTER — Other Ambulatory Visit: Payer: Self-pay

## 2021-05-30 ENCOUNTER — Ambulatory Visit (INDEPENDENT_AMBULATORY_CARE_PROVIDER_SITE_OTHER): Payer: Medicare HMO | Admitting: Family Medicine

## 2021-05-30 VITALS — BP 99/65 | HR 87 | Temp 97.9°F | Ht 67.0 in | Wt 225.6 lb

## 2021-05-30 DIAGNOSIS — J069 Acute upper respiratory infection, unspecified: Secondary | ICD-10-CM | POA: Diagnosis not present

## 2021-05-30 DIAGNOSIS — J209 Acute bronchitis, unspecified: Secondary | ICD-10-CM | POA: Diagnosis not present

## 2021-05-30 DIAGNOSIS — E010 Iodine-deficiency related diffuse (endemic) goiter: Secondary | ICD-10-CM

## 2021-05-30 MED ORDER — PREDNISONE 20 MG PO TABS: ORAL_TABLET | ORAL | 0 refills | Status: DC

## 2021-05-30 MED ORDER — DOXYCYCLINE HYCLATE 100 MG PO CAPS: 100.0000 mg | ORAL_CAPSULE | Freq: Two times a day (BID) | ORAL | 0 refills | Status: DC

## 2021-05-30 MED ORDER — BENZONATATE 200 MG PO CAPS: ORAL_CAPSULE | ORAL | 0 refills | Status: DC
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
## 2021-06-04 ENCOUNTER — Other Ambulatory Visit: Payer: Self-pay

## 2021-06-04 ENCOUNTER — Encounter: Payer: Self-pay | Admitting: Family Medicine

## 2021-06-04 ENCOUNTER — Ambulatory Visit (HOSPITAL_BASED_OUTPATIENT_CLINIC_OR_DEPARTMENT_OTHER)
Admission: RE | Admit: 2021-06-04 | Discharge: 2021-06-04 | Disposition: A | Discharge status: Home / Self Care | Payer: Medicare HMO | Source: Ambulatory Visit | Attending: Family Medicine | Admitting: Family Medicine

## 2021-06-04 ENCOUNTER — Other Ambulatory Visit: Payer: Self-pay | Admitting: Family Medicine

## 2021-06-04 DIAGNOSIS — E010 Iodine-deficiency related diffuse (endemic) goiter: Secondary | ICD-10-CM

## 2021-06-04 DIAGNOSIS — E042 Nontoxic multinodular goiter: Secondary | ICD-10-CM | POA: Diagnosis not present

## 2021-06-04 DIAGNOSIS — E041 Nontoxic single thyroid nodule: Secondary | ICD-10-CM

## 2021-06-05 ENCOUNTER — Encounter: Payer: Self-pay | Admitting: Family Medicine

## 2021-06-05 ENCOUNTER — Ambulatory Visit (INDEPENDENT_AMBULATORY_CARE_PROVIDER_SITE_OTHER): Payer: Medicare HMO | Admitting: Family Medicine

## 2021-06-05 VITALS — BP 95/58 | HR 71 | Temp 97.4°F | Ht 67.0 in | Wt 222.2 lb

## 2021-06-05 DIAGNOSIS — E119 Type 2 diabetes mellitus without complications: Secondary | ICD-10-CM | POA: Diagnosis not present

## 2021-06-05 DIAGNOSIS — Z79899 Other long term (current) drug therapy: Secondary | ICD-10-CM

## 2021-06-05 DIAGNOSIS — I10 Essential (primary) hypertension: Secondary | ICD-10-CM

## 2021-06-05 DIAGNOSIS — G894 Chronic pain syndrome: Secondary | ICD-10-CM | POA: Diagnosis not present

## 2021-06-05 DIAGNOSIS — E7800 Pure hypercholesterolemia, unspecified: Secondary | ICD-10-CM

## 2021-06-05 DIAGNOSIS — M170 Bilateral primary osteoarthritis of knee: Secondary | ICD-10-CM

## 2021-06-05 DIAGNOSIS — E041 Nontoxic single thyroid nodule: Secondary | ICD-10-CM | POA: Diagnosis not present

## 2021-06-05 LAB — LIPID PANEL
Cholesterol: 110 mg/dL (ref 0–200)
HDL: 56.5 mg/dL (ref >39.00)
LDL Cholesterol: 35 mg/dL (ref 0–99)
NonHDL: 53.17
Total CHOL/HDL Ratio: 2
Triglycerides: 91 mg/dL (ref 0.0–149.0)
VLDL: 18.2 mg/dL (ref 0.0–40.0)

## 2021-06-05 LAB — COMPREHENSIVE METABOLIC PANEL
ALT: 16 U/L (ref 0–35)
AST: 16 U/L (ref 0–37)
Albumin: 4 g/dL (ref 3.5–5.2)
Alkaline Phosphatase: 170 U/L — ABNORMAL HIGH (ref 39–117)
BUN: 13 mg/dL (ref 6–23)
CO2: 27 mEq/L (ref 19–32)
Calcium: 9.3 mg/dL (ref 8.4–10.5)
Chloride: 102 mEq/L (ref 96–112)
Creatinine, Ser: 0.71 mg/dL (ref 0.40–1.20)
GFR: 88.87 mL/min (ref >60.00)
Glucose, Bld: 145 mg/dL — ABNORMAL HIGH (ref 70–99)
Potassium: 4 mEq/L (ref 3.5–5.1)
Sodium: 138 mEq/L (ref 135–145)
Total Bilirubin: 1 mg/dL (ref 0.2–1.2)
Total Protein: 7 g/dL (ref 6.0–8.3)

## 2021-06-05 LAB — HEMOGLOBIN A1C: Hgb A1c MFr Bld: 7 % — ABNORMAL HIGH (ref 4.6–6.5)

## 2021-06-05 LAB — T4, FREE: Free T4: 0.67 ng/dL (ref 0.60–1.60)

## 2021-06-05 LAB — MICROALBUMIN / CREATININE URINE RATIO
Creatinine,U: 54.9 mg/dL
Microalb Creat Ratio: 1.3 mg/g (ref 0.0–30.0)
Microalb, Ur: <0.7 mg/dL (ref 0.0–1.9)

## 2021-06-05 LAB — TSH: TSH: 6.21 u[IU]/mL — ABNORMAL HIGH (ref 0.35–5.50)

## 2021-06-05 MED ORDER — GABAPENTIN 100 MG PO CAPS: 100.0000 mg | ORAL_CAPSULE | Freq: Every day | ORAL | 3 refills | Status: DC

## 2021-06-05 MED ORDER — GLIPIZIDE 10 MG PO TABS: ORAL_TABLET | ORAL | 3 refills | Status: DC

## 2021-06-05 MED ORDER — LISINOPRIL 5 MG PO TABS: 5.0000 mg | ORAL_TABLET | Freq: Every day | ORAL | 3 refills | Status: DC

## 2021-06-05 MED ORDER — ATORVASTATIN CALCIUM 20 MG PO TABS: 20.0000 mg | ORAL_TABLET | Freq: Every day | ORAL | 3 refills | Status: DC

## 2021-06-05 MED ORDER — CANAGLIFLOZIN 300 MG PO TABS: 300.0000 mg | ORAL_TABLET | Freq: Every day | ORAL | 3 refills | Status: DC

## 2021-06-05 MED ORDER — HYDROCODONE-ACETAMINOPHEN 5-325 MG PO TABS: ORAL_TABLET | ORAL | 0 refills | Status: DC

## 2021-06-05 MED ORDER — PIOGLITAZONE HCL 45 MG PO TABS: 45.0000 mg | ORAL_TABLET | Freq: Every day | ORAL | 3 refills | Status: DC
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
## 2021-06-06 ENCOUNTER — Encounter: Payer: Self-pay | Admitting: Family Medicine

## 2021-06-06 LAB — T3: T3, Total: 158 ng/dL (ref 76–181)

## 2021-06-09 ENCOUNTER — Encounter: Payer: Self-pay | Admitting: Family Medicine

## 2021-06-11 ENCOUNTER — Other Ambulatory Visit: Payer: Self-pay | Admitting: Family Medicine

## 2021-06-11 NOTE — Telephone Encounter (Signed)
OK, pls notify pt of this updated information. Tell her it is not urgent that this be evaluated by him but I understand her anxiety with waiting.  If their office does not call her this week then I'll submit referral to different endocrinologist.  Unfortunately, most specialists offices (particularly endocrinology) do this kind of thing. -thx

## 2021-06-18 ENCOUNTER — Ambulatory Visit
Admission: RE | Admit: 2021-06-18 | Discharge: 2021-06-18 | Disposition: A | Discharge status: Home / Self Care | Payer: Medicare HMO | Source: Ambulatory Visit | Attending: Family Medicine | Admitting: Family Medicine

## 2021-06-18 ENCOUNTER — Other Ambulatory Visit: Payer: Self-pay

## 2021-06-18 DIAGNOSIS — Z1231 Encounter for screening mammogram for malignant neoplasm of breast: Secondary | ICD-10-CM

## 2021-06-20 DIAGNOSIS — E042 Nontoxic multinodular goiter: Secondary | ICD-10-CM | POA: Diagnosis not present

## 2021-06-20 DIAGNOSIS — R946 Abnormal results of thyroid function studies: Secondary | ICD-10-CM | POA: Diagnosis not present

## 2021-06-20 DIAGNOSIS — M810 Age-related osteoporosis without current pathological fracture: Secondary | ICD-10-CM | POA: Diagnosis not present

## 2021-06-22 ENCOUNTER — Other Ambulatory Visit: Payer: Self-pay | Admitting: Internal Medicine

## 2021-06-22 DIAGNOSIS — E042 Nontoxic multinodular goiter: Secondary | ICD-10-CM

## 2021-06-27 ENCOUNTER — Encounter: Payer: Self-pay | Admitting: Family Medicine

## 2021-07-18 ENCOUNTER — Other Ambulatory Visit (HOSPITAL_COMMUNITY)
Admission: RE | Admit: 2021-07-18 | Discharge: 2021-07-18 | Disposition: A | Discharge status: Home / Self Care | Payer: Medicare HMO | Source: Ambulatory Visit | Attending: Internal Medicine | Admitting: Internal Medicine

## 2021-07-18 ENCOUNTER — Ambulatory Visit
Admission: RE | Admit: 2021-07-18 | Discharge: 2021-07-18 | Disposition: A | Discharge status: Home / Self Care | Payer: Medicare HMO | Source: Ambulatory Visit | Attending: Internal Medicine | Admitting: Internal Medicine

## 2021-07-18 DIAGNOSIS — E049 Nontoxic goiter, unspecified: Secondary | ICD-10-CM | POA: Diagnosis not present

## 2021-07-18 DIAGNOSIS — E042 Nontoxic multinodular goiter: Secondary | ICD-10-CM

## 2021-07-18 DIAGNOSIS — E041 Nontoxic single thyroid nodule: Secondary | ICD-10-CM | POA: Diagnosis not present

## 2021-07-18 HISTORY — PX: OTHER SURGICAL HISTORY: SHX169

## 2021-07-19 LAB — CYTOLOGY - NON PAP

## 2021-07-23 ENCOUNTER — Telehealth: Payer: Medicare HMO | Admitting: Nurse Practitioner

## 2021-07-23 DIAGNOSIS — R946 Abnormal results of thyroid function studies: Secondary | ICD-10-CM | POA: Diagnosis not present

## 2021-07-23 DIAGNOSIS — E042 Nontoxic multinodular goiter: Secondary | ICD-10-CM | POA: Diagnosis not present

## 2021-07-23 DIAGNOSIS — J0110 Acute frontal sinusitis, unspecified: Secondary | ICD-10-CM

## 2021-07-23 MED ORDER — DOXYCYCLINE HYCLATE 100 MG PO TABS
100.0000 mg | ORAL_TABLET | Freq: Two times a day (BID) | ORAL | 0 refills | Status: AC
Start: 2021-07-23 — End: 2021-08-02

## 2021-07-23 NOTE — Progress Notes (Signed)
E-Visit for Sinus Problems  We are sorry that you are not feeling well.  Here is how we plan to help!  Based on what you have shared with me it looks like you have sinusitis.  Sinusitis is inflammation and infection in the sinus cavities of the head.  Based on your presentation I believe you most likely have Acute Bacterial Sinusitis.  This is an infection caused by bacteria and is treated with antibiotics. I have prescribed Doxycycline 100mg  by mouth twice a day for 10 days. You may use an oral decongestant such as Mucinex D or if you have glaucoma or high blood pressure use plain Mucinex. Saline nasal spray help and can safely be used as often as needed for congestion.  If you develop worsening sinus pain, fever or notice severe headache and vision changes, or if symptoms are not better after completion of antibiotic, please schedule an appointment with a health care provider.    Sinus infections are not as easily transmitted as other respiratory infection, however we still recommend that you avoid close contact with loved ones, especially the very young and elderly.  Remember to wash your hands thoroughly throughout the day as this is the number one way to prevent the spread of infection!  Home Care: Only take medications as instructed by your medical team. Complete the entire course of an antibiotic. Do not take these medications with alcohol. A steam or ultrasonic humidifier can help congestion.  You can place a towel over your head and breathe in the steam from hot water coming from a faucet. Avoid close contacts especially the very young and the elderly. Cover your mouth when you cough or sneeze. Always remember to wash your hands.  Get Help Right Away If: You develop worsening fever or sinus pain. You develop a severe head ache or visual changes. Your symptoms persist after you have completed your treatment plan.  Make sure you Understand these instructions. Will watch your  condition. Will get help right away if you are not doing well or get worse.  Thank you for choosing an e-visit.  Your e-visit answers were reviewed by a board certified advanced clinical practitioner to complete your personal care plan. Depending upon the condition, your plan could have included both over the counter or prescription medications.  Please review your pharmacy choice. Make sure the pharmacy is open so you can pick up prescription now. If there is a problem, you may contact your provider through [employer] and have the prescription routed to another pharmacy.  Your safety is important to [place]. If you have drug allergies check your prescription carefully.   For the next 24 hours you can use MyChart to ask questions about today's visit, request a non-urgent call back, or ask for a work or school excuse. You will get an email in the next two days asking about your experience. I hope that your e-visit has been valuable and will speed your recovery.   I spent approximately 7 minutes reviewing the patient's history, current symptoms and coordinating their plan of care today.    Meds ordered this encounter  Medications   doxycycline (VIBRA-TABS) 100 MG tablet    Sig: Take 1 tablet (100 mg total) by mouth 2 (two) times daily for 10 days.    Dispense:  20 tablet    Refill:  0

## 2021-07-27 ENCOUNTER — Encounter: Payer: Self-pay | Admitting: Family Medicine

## 2021-07-31 ENCOUNTER — Other Ambulatory Visit: Payer: Self-pay | Admitting: Family Medicine

## 2021-07-31 NOTE — Telephone Encounter (Signed)
Requesting: [employer] Contract: 12/04/20  UDS: 12/04/20 Last Visit: 06/05/21 Next Visit: 09/06/21 Last Refill:06/05/21(90,0)  Please Advise. Med pending

## 2021-08-01 NOTE — Telephone Encounter (Signed)
Pt advised refill sent. °

## 2021-08-05 ENCOUNTER — Telehealth: Payer: Medicare HMO | Admitting: Physician Assistant

## 2021-08-05 DIAGNOSIS — J019 Acute sinusitis, unspecified: Secondary | ICD-10-CM | POA: Diagnosis not present

## 2021-08-05 DIAGNOSIS — B9689 Other specified bacterial agents as the cause of diseases classified elsewhere: Secondary | ICD-10-CM | POA: Diagnosis not present

## 2021-08-05 MED ORDER — DOXYCYCLINE HYCLATE 100 MG PO TABS: 100.0000 mg | ORAL_TABLET | Freq: Two times a day (BID) | ORAL | 0 refills | Status: DC

## 2021-08-05 NOTE — Progress Notes (Signed)

## 2021-08-23 ENCOUNTER — Ambulatory Visit: Payer: Self-pay | Admitting: Surgery

## 2021-08-23 DIAGNOSIS — D440 Neoplasm of uncertain behavior of thyroid gland: Secondary | ICD-10-CM | POA: Diagnosis not present

## 2021-08-23 DIAGNOSIS — E042 Nontoxic multinodular goiter: Secondary | ICD-10-CM | POA: Diagnosis not present

## 2021-08-27 ENCOUNTER — Encounter: Payer: Self-pay | Admitting: Family Medicine

## 2021-08-31 ENCOUNTER — Other Ambulatory Visit: Payer: Self-pay | Admitting: Family Medicine

## 2021-08-31 NOTE — Telephone Encounter (Signed)
Requesting: [employer] Contract: 12/04/20 UDS: 12/04/20 Last Visit: 06/05/21 Next Visit:09/06/21 Last Refill: 07/31/21(90,0)  Please Advise. Medication pending

## 2021-09-04 NOTE — Telephone Encounter (Addendum)
Pt advised of refill

## 2021-09-06 ENCOUNTER — Telehealth: Payer: Self-pay

## 2021-09-06 ENCOUNTER — Encounter: Payer: Medicare HMO | Admitting: Family Medicine

## 2021-09-07 ENCOUNTER — Other Ambulatory Visit: Payer: Self-pay

## 2021-09-10 ENCOUNTER — Encounter: Payer: Self-pay | Admitting: Family Medicine

## 2021-09-10 ENCOUNTER — Ambulatory Visit (INDEPENDENT_AMBULATORY_CARE_PROVIDER_SITE_OTHER): Payer: Medicare HMO | Admitting: Family Medicine

## 2021-09-10 ENCOUNTER — Other Ambulatory Visit: Payer: Self-pay

## 2021-09-10 VITALS — BP 98/62 | HR 77 | Temp 97.6°F | Ht 67.0 in | Wt 216.0 lb

## 2021-09-10 DIAGNOSIS — G8929 Other chronic pain: Secondary | ICD-10-CM

## 2021-09-10 DIAGNOSIS — R7989 Other specified abnormal findings of blood chemistry: Secondary | ICD-10-CM

## 2021-09-10 DIAGNOSIS — G894 Chronic pain syndrome: Secondary | ICD-10-CM

## 2021-09-10 DIAGNOSIS — M5136 Other intervertebral disc degeneration, lumbar region: Secondary | ICD-10-CM

## 2021-09-10 DIAGNOSIS — E119 Type 2 diabetes mellitus without complications: Secondary | ICD-10-CM | POA: Diagnosis not present

## 2021-09-10 DIAGNOSIS — Z23 Encounter for immunization: Secondary | ICD-10-CM

## 2021-09-10 DIAGNOSIS — I10 Essential (primary) hypertension: Secondary | ICD-10-CM | POA: Diagnosis not present

## 2021-09-10 DIAGNOSIS — M170 Bilateral primary osteoarthritis of knee: Secondary | ICD-10-CM

## 2021-09-10 DIAGNOSIS — M79671 Pain in right foot: Secondary | ICD-10-CM | POA: Diagnosis not present

## 2021-09-10 DIAGNOSIS — E041 Nontoxic single thyroid nodule: Secondary | ICD-10-CM

## 2021-09-10 DIAGNOSIS — Z0000 Encounter for general adult medical examination without abnormal findings: Secondary | ICD-10-CM

## 2021-09-10 LAB — TSH: TSH: 5.78 u[IU]/mL — ABNORMAL HIGH (ref 0.35–5.50)

## 2021-09-10 LAB — CBC WITH DIFFERENTIAL/PLATELET
Basophils Absolute: 0.1 10*3/uL (ref 0.0–0.1)
Basophils Relative: 1.2 % (ref 0.0–3.0)
Eosinophils Absolute: 0.1 10*3/uL (ref 0.0–0.7)
Eosinophils Relative: 1.5 % (ref 0.0–5.0)
HCT: 42.5 % (ref 36.0–46.0)
Hemoglobin: 14.2 g/dL (ref 12.0–15.0)
Lymphocytes Relative: 25.8 % (ref 12.0–46.0)
Lymphs Abs: 1.5 10*3/uL (ref 0.7–4.0)
MCHC: 33.5 g/dL (ref 30.0–36.0)
MCV: 90.3 fl (ref 78.0–100.0)
Monocytes Absolute: 0.5 10*3/uL (ref 0.1–1.0)
Monocytes Relative: 8 % (ref 3.0–12.0)
Neutro Abs: 3.6 10*3/uL (ref 1.4–7.7)
Neutrophils Relative %: 63.5 % (ref 43.0–77.0)
Platelets: 243 10*3/uL (ref 150.0–400.0)
RBC: 4.7 Mil/uL (ref 3.87–5.11)
RDW: 14.5 % (ref 11.5–15.5)
WBC: 5.7 10*3/uL (ref 4.0–10.5)

## 2021-09-10 LAB — BASIC METABOLIC PANEL
BUN: 14 mg/dL (ref 6–23)
CO2: 26 mEq/L (ref 19–32)
Calcium: 9.7 mg/dL (ref 8.4–10.5)
Chloride: 102 mEq/L (ref 96–112)
Creatinine, Ser: 0.82 mg/dL (ref 0.40–1.20)
GFR: 74.63 mL/min (ref >60.00)
Glucose, Bld: 156 mg/dL — ABNORMAL HIGH (ref 70–99)
Potassium: 4.9 mEq/L (ref 3.5–5.1)
Sodium: 136 mEq/L (ref 135–145)

## 2021-09-10 LAB — T4, FREE: Free T4: 0.65 ng/dL (ref 0.60–1.60)

## 2021-09-10 LAB — HEMOGLOBIN A1C: Hgb A1c MFr Bld: 7.3 % — ABNORMAL HIGH (ref 4.6–6.5)

## 2021-09-10 MED ORDER — METHOCARBAMOL 500 MG PO TABS: ORAL_TABLET | ORAL | 3 refills | Status: DC

## 2021-09-10 MED ORDER — MONTELUKAST SODIUM 10 MG PO TABS: 10.0000 mg | ORAL_TABLET | Freq: Every day | ORAL | 11 refills | Status: DC

## 2021-09-10 MED ORDER — FLUTICASONE PROPIONATE 50 MCG/ACT NA SUSP: NASAL | 3 refills | Status: DC

## 2021-09-10 MED ORDER — TRAMADOL HCL 50 MG PO TABS: ORAL_TABLET | ORAL | 5 refills | Status: DC

## 2021-09-10 NOTE — Patient Instructions (Signed)

## 2021-09-10 NOTE — Telephone Encounter (Signed)
Lvm per [facility]

## 2021-09-10 NOTE — Addendum Note (Signed)
Addended by: Deveron Furlong D on: 09/10/2021 09:57 AM   Modules accepted: Orders
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
## 2021-09-10 NOTE — Telephone Encounter (Signed)
Requesting: tramadol Contract: 12/04/20 UDS: 12/04/20 Last Visit: 09/10/21 Next Visit:12/07/21 Last Refill: 03/05/21(90,5)  Please Advise. Med pending

## 2021-09-11 ENCOUNTER — Other Ambulatory Visit: Payer: Self-pay | Admitting: Family Medicine

## 2021-09-11 LAB — T3: T3, Total: 154 ng/dL (ref 76–181)

## 2021-09-11 MED ORDER — OZEMPIC (0.25 OR 0.5 MG/DOSE) 2 MG/1.5ML ~~LOC~~ SOPN: PEN_INJECTOR | SUBCUTANEOUS | 0 refills | Status: DC

## 2021-09-11 NOTE — Patient Instructions (Addendum)
DUE TO COVID-19 ONLY ONE VISITOR IS ALLOWED TO COME WITH YOU AND STAY IN THE WAITING ROOM ONLY DURING PRE OP AND PROCEDURE.   **NO VISITORS ARE ALLOWED IN THE SHORT STAY AREA OR RECOVERY ROOM!!**  IF YOU WILL BE ADMITTED INTO THE HOSPITAL YOU ARE ALLOWED ONLY TWO SUPPORT PEOPLE DURING VISITATION HOURS ONLY (7 AM -8PM)   The support person(s) must pass our screening, gel in and out, and wear a mask at all times, including in the patients room. Patients must also wear a mask when staff or their support person are in the room. Visitors GUEST BADGE MUST BE WORN VISIBLY  One adult visitor may remain with you overnight and MUST be in the room by 8 P.M.  No visitors under the age of 30. Any visitor under the age of 69 must be accompanied by an adult.    COVID SWAB TESTING MUST BE COMPLETED ON: 09/20/21 @ 8:00 AM    Site: [facility] [place] [place] Enter: Main Entrance have a seat in the waiting area to the right of main entrance (DO NOT [place]!!!!!) Dial: [phone] to alert staff you have arrived  You are not required to quarantine, however you are required to wear a well-fitted mask when you are out and around people not in your household.  Hand Hygiene often Do NOT share personal items Notify your provider if you are in close contact with someone who has COVID or you develop fever 100.4 or greater, new onset of sneezing, cough, sore throat, shortness of breath or body aches.  [address], [address], must go inside of the hospital, NOT A DRIVE THRU!  (Must self quarantine after testing. Follow instructions on handout.)       Your procedure is scheduled on: 09/24/21   Report to [facility] Main Entrance    Report to short stay at : 5:15 AM   Call this number if you have problems the morning of surgery [phone]   Do not eat food :After Midnight.   May have liquids  until: 4:30 AM    day of surgery  CLEAR LIQUID DIET  Foods Allowed                                                                     Foods Excluded  Water, Black Coffee and tea, regular and decaf                             liquids that you cannot  Plain Jell-O in any flavor  (No red)                                           see through such as: Fruit ices (not with fruit pulp)                                     milk, soups, orange juice  Iced Popsicles (No red)                                    All solid food                                   Apple juices Sports drinks like Gatorade (No red) Lightly seasoned clear broth or consume(fat free) Sugar Sample Menu Breakfast                                Lunch                                     Supper Cranberry juice                    Beef broth                            Chicken broth Jell-O                                     Grape juice                           Apple juice Coffee or tea                        Jell-O                                      Popsicle                                                Coffee or tea                        Coffee or tea      Oral Hygiene is also important to reduce your risk of infection.                                    Remember - BRUSH YOUR TEETH THE MORNING OF SURGERY WITH YOUR REGULAR TOOTHPASTE   Do NOT smoke after Midnight   Take these medicines the morning of surgery with A SIP OF WATER: N/A. Use Flonase as usual. How to Manage Your Diabetes Before and After Surgery  Why is it important to control my blood sugar before and after surgery? Improving blood sugar levels before and after surgery helps healing and can limit problems. A way of improving blood sugar control is eating a healthy diet by:  Eating less sugar and carbohydrates  Increasing activity/exercise  Talking with your doctor about reaching your blood sugar goals High blood sugars (greater than 180 mg/dL)  can raise your risk of infections and slow  your recovery, so you will need to focus on controlling your diabetes during the weeks before surgery. Make sure that the doctor who takes care of your diabetes knows about your planned surgery including the date and location.  How do I manage my blood sugar before surgery? Check your blood sugar at least 4 times a day, starting 2 days before surgery, to make sure that the level is not too high or low. Check your blood sugar the morning of your surgery when you wake up and every 2 hours until you get to the Short Stay unit. If your blood sugar is less than 70 mg/dL, you will need to treat for low blood sugar: Do not take insulin. Treat a low blood sugar (less than 70 mg/dL) with  cup of clear juice (cranberry or apple), 4 glucose tablets, OR glucose gel. Recheck blood sugar in 15 minutes after treatment (to make sure it is greater than 70 mg/dL). If your blood sugar is not greater than 70 mg/dL on recheck, call [phone] for further instructions. Report your blood sugar to the short stay nurse when you get to Short Stay.  If you are admitted to the hospital after surgery: Your blood sugar will be checked by the staff and you will probably be given insulin after surgery (instead of oral diabetes medicines) to make sure you have good blood sugar levels. The goal for blood sugar control after surgery is 80-180 mg/dL.   WHAT DO I DO ABOUT MY DIABETES MEDICATION?  Do not take oral diabetes medicines (pills) the morning of surgery.  THE DAY BEFORE SURGERY, take actos as usual. ONLY take the morning dose of glipizide.DO NOT take invokana.       THE MORNING OF SURGERY, DO NOT TAKE ANY ORAL DIABETIC MEDICATIONS DAY OF YOUR SURGERY                              You may not have any metal on your body including hair pins, jewelry, and body piercing             Do not wear make-up, lotions, powders, perfumes/cologne, or deodorant  Do not wear nail polish  including gel and S&S, artificial/acrylic nails, or any other type of covering on natural nails including finger and toenails. If you have artificial nails, gel coating, etc. that needs to be removed by a nail salon please have this removed prior to surgery or surgery may need to be canceled/ delayed if the surgeon/ anesthesia feels like they are unable to be safely monitored.   Do not shave  48 hours prior to surgery.    Do not bring valuables to the hospital. [place].   Contacts, dentures or bridgework may not be worn into surgery.   Bring small overnight bag day of surgery.    Patients discharged on the day of surgery will not be allowed to drive home.  Someone needs to stay with you for the first 24 hours after anesthesia.   Special Instructions: Bring a copy of your healthcare power of attorney and living will documents         the day of surgery if you haven't scanned them before.              Please read over the following fact sheets you were given: IF  YOU HAVE QUESTIONS ABOUT YOUR PRE-OP INSTRUCTIONS PLEASE CALL [phone]     [facility] - Preparing for Surgery Before surgery, you can play an important role.  Because skin is not sterile, your skin needs to be as free of germs as possible.  You can reduce the number of germs on your skin by washing with CHG (chlorahexidine gluconate) soap before surgery.  CHG is an antiseptic cleaner which kills germs and bonds with the skin to continue killing germs even after washing. Please DO NOT use if you have an allergy to CHG or antibacterial soaps.  If your skin becomes reddened/irritated stop using the CHG and inform your nurse when you arrive at Short Stay. Do not shave (including legs and underarms) for at least 48 hours prior to the first CHG shower.  You may shave your face/neck. Please follow these instructions carefully:  1.  Shower with CHG Soap the night before surgery and the   morning of Surgery.  2.  If you choose to wash your hair, wash your hair first as usual with your  normal  shampoo.  3.  After you shampoo, rinse your hair and body thoroughly to remove the  shampoo.                           4.  Use CHG as you would any other liquid soap.  You can apply chg directly  to the skin and wash                       Gently with a scrungie or clean washcloth.  5.  Apply the CHG Soap to your body ONLY FROM THE NECK DOWN.   Do not use on face/ open                           Wound or open sores. Avoid contact with eyes, ears mouth and genitals (private parts).                       Wash face,  Genitals (private parts) with your normal soap.             6.  Wash thoroughly, paying special attention to the area where your surgery  will be performed.  7.  Thoroughly rinse your body with warm water from the neck down.  8.  DO NOT shower/wash with your normal soap after using and rinsing off  the CHG Soap.                9.  Pat yourself dry with a clean towel.            10.  Wear clean pajamas.            11.  Place clean sheets on your bed the night of your first shower and do not  sleep with pets. Day of Surgery : Do not apply any lotions/deodorants the morning of surgery.  Please wear clean clothes to the hospital/surgery center.  FAILURE TO FOLLOW THESE INSTRUCTIONS MAY RESULT IN THE CANCELLATION OF YOUR SURGERY PATIENT SIGNATURE_________________________________  NURSE SIGNATURE__________________________________  ________________________________________________________________________

## 2021-09-12 ENCOUNTER — Encounter (HOSPITAL_COMMUNITY): Payer: Self-pay

## 2021-09-12 ENCOUNTER — Ambulatory Visit (HOSPITAL_COMMUNITY)
Admission: RE | Admit: 2021-09-12 | Discharge: 2021-09-12 | Disposition: A | Discharge status: Home / Self Care | Payer: Medicare HMO | Source: Ambulatory Visit | Attending: Anesthesiology | Admitting: Anesthesiology

## 2021-09-12 ENCOUNTER — Other Ambulatory Visit: Payer: Self-pay

## 2021-09-12 ENCOUNTER — Telehealth: Payer: Self-pay | Admitting: Family Medicine

## 2021-09-12 ENCOUNTER — Encounter (HOSPITAL_COMMUNITY)
Admission: RE | Admit: 2021-09-12 | Discharge: 2021-09-12 | Disposition: A | Discharge status: Home / Self Care | Payer: Medicare HMO | Source: Ambulatory Visit | Attending: Surgery | Admitting: Surgery

## 2021-09-12 VITALS — BP 131/63 | HR 79 | Temp 97.7°F | Ht 67.5 in | Wt 216.0 lb

## 2021-09-12 DIAGNOSIS — E119 Type 2 diabetes mellitus without complications: Secondary | ICD-10-CM | POA: Insufficient documentation

## 2021-09-12 DIAGNOSIS — E079 Disorder of thyroid, unspecified: Secondary | ICD-10-CM | POA: Diagnosis not present

## 2021-09-12 DIAGNOSIS — Z01818 Encounter for other preprocedural examination: Secondary | ICD-10-CM | POA: Insufficient documentation

## 2021-09-12 DIAGNOSIS — C73 Malignant neoplasm of thyroid gland: Secondary | ICD-10-CM

## 2021-09-12 DIAGNOSIS — R9431 Abnormal electrocardiogram [ECG] [EKG]: Secondary | ICD-10-CM | POA: Diagnosis not present

## 2021-09-12 HISTORY — DX: Malignant neoplasm of thyroid gland: C73

## 2021-09-12 LAB — GLUCOSE, CAPILLARY: Glucose-Capillary: 172 mg/dL — ABNORMAL HIGH (ref 70–99)

## 2021-09-12 NOTE — Telephone Encounter (Signed)
Please see message below

## 2021-09-12 NOTE — Telephone Encounter (Signed)
FYI: Pt was going to start taking ozempic this week, but surgeon recommend she wait after her surgery.  Pt is having surgery on Feb.13 Pt just wanted Dr. Anitra Lauth to be aware--KR

## 2021-09-12 NOTE — Progress Notes (Signed)
COVID Vaccine Completed:Yes Date COVID Vaccine completed: 12/07/20 x 4 COVID vaccine manufacturer:   Moderna    COVID Test: 09/20/21 @ 8:00 AM PCP - Dr. Robin Searing. LOV: 09/10/21 Cardiologist -   Chest x-ray -  EKG -  Stress Test -  ECHO -  Cardiac Cath -  Pacemaker/ICD device last checked: A1-C: 7.3: 09/10/21: EPIC Sleep Study -  CPAP -   Fasting Blood Sugar - 130 - 150 Checks Blood Sugar ___2__ times a day  Blood Thinner Instructions: Aspirin Instructions: Last Dose:  Anesthesia review: Hx: DVT,HTN,DIA. Pt. Has an inactive stimulator on her left upper back.  Patient denies shortness of breath, fever, cough and chest pain at PAT appointment   Patient verbalized understanding of instructions that were given to them at the PAT appointment. Patient was also instructed that they will need to review over the PAT instructions again at home before surgery.

## 2021-09-12 NOTE — Telephone Encounter (Signed)
Noted  

## 2021-09-20 ENCOUNTER — Encounter (HOSPITAL_COMMUNITY)
Admission: RE | Admit: 2021-09-20 | Discharge: 2021-09-20 | Disposition: A | Discharge status: Home / Self Care | Payer: Medicare HMO | Source: Ambulatory Visit | Attending: Surgery | Admitting: Surgery

## 2021-09-20 ENCOUNTER — Other Ambulatory Visit: Payer: Self-pay

## 2021-09-20 DIAGNOSIS — Z20822 Contact with and (suspected) exposure to covid-19: Secondary | ICD-10-CM | POA: Insufficient documentation

## 2021-09-20 DIAGNOSIS — Z01812 Encounter for preprocedural laboratory examination: Secondary | ICD-10-CM | POA: Insufficient documentation

## 2021-09-20 DIAGNOSIS — Z01818 Encounter for other preprocedural examination: Secondary | ICD-10-CM

## 2021-09-20 LAB — SARS CORONAVIRUS 2 (TAT 6-24 HRS): SARS Coronavirus 2: NEGATIVE

## 2021-09-21 ENCOUNTER — Encounter (HOSPITAL_COMMUNITY): Payer: Self-pay | Admitting: Surgery

## 2021-09-23 ENCOUNTER — Encounter (HOSPITAL_COMMUNITY): Payer: Self-pay | Admitting: Surgery

## 2021-09-23 DIAGNOSIS — D440 Neoplasm of uncertain behavior of thyroid gland: Secondary | ICD-10-CM | POA: Diagnosis present

## 2021-09-23 DIAGNOSIS — E042 Nontoxic multinodular goiter: Secondary | ICD-10-CM | POA: Diagnosis present

## 2021-09-23 NOTE — H&P (Signed)
REFERRING PHYSICIAN: Katina Degree, MD  PROVIDER: Miaa Latterell Charlotta Newton, MD  Chief Complaint: New Consultation (Thyroid neoplasm, Bethesda V)   History of Present Illness:  Patient is referred by Dr. 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 Patient's primary care physician is Dr. Shawnie Dapper. Patient had engaged in a weight loss program. After substantial weight loss on physical examination she was noted by her primary care physician to have a thyroid nodule. Patient underwent ultrasound examination on June 04, 2021. This showed a mildly enlarged thyroid gland containing multiple thyroid nodules. There was a dominant nodule in the thyroid isthmus measuring 3.2 cm which was felt to be mildly suspicious for malignancy. Patient underwent ultrasound-guided fine-needle aspiration biopsy on July 18, 2021. This showed cytologic atypia suspicious for malignancy, specifically suspicious for papillary thyroid carcinoma, Bethesda category V. TSH level is mildly elevated at 6.21. Patient is not taking levothyroxine. Patient was seen and evaluated by Dr. Delrae Rend. Recommendation was made for thyroidectomy and the patient was referred to my practice. She has no prior history of head or neck surgery. She has never been on thyroid medication. There is no family history of thyroid disease and specifically no history of thyroid cancer. Patient works at [employer] as well as at [employer].  Review of Systems: A complete review of systems was obtained from the patient. I have reviewed this information and discussed as appropriate with the patient. See HPI as well for other ROS.  Review of Systems  Constitutional: Positive for weight loss.  HENT: Negative.  Eyes: Negative.  Respiratory: Negative.  Cardiovascular: Negative.  Gastrointestinal: Negative.  Genitourinary:  Negative.  Musculoskeletal: Negative.  Skin: Negative.  Neurological: Negative.  Endo/Heme/Allergies: Negative.  Psychiatric/Behavioral: Negative.    Medical History: Past Medical History:  Diagnosis Date   Diabetes mellitus without complication (CMS-HCC)   DVT (deep venous thrombosis) (CMS-HCC)   Patient Active Problem List  Diagnosis   Neoplasm of uncertain behavior of thyroid gland   Multiple thyroid nodules   Past Surgical History:  Procedure Laterality Date   CHOLECYSTECTOMY   JOINT REPLACEMENT   SPINE SURGERY   TONSILLECTOMY    Allergies  Allergen Reactions   Metformin Nausea And Vomiting and Other (See Comments)  GI upset   Current Outpatient Medications on File Prior to Visit  Medication Sig Dispense Refill   atorvastatin (LIPITOR) 20 MG tablet atorvastatin 20 mg tablet   canagliflozin (INVOKANA) 300 mg tablet Invokana 300 mg tablet   cholecalciferol (VITAMIN D3) 1000 unit tablet Take by mouth   cyanocobalamin (VITAMIN B12) 1,000 mcg/mL injection Inject into the muscle monthly   fluticasone propionate (FLONASE) 50 mcg/actuation nasal spray fluticasone propionate 50 mcg/actuation nasal spray,suspension   gabapentin (NEURONTIN) 100 MG capsule gabapentin 100 mg capsule Take 1 capsule every day at bedtime   glipiZIDE (GLUCOTROL) 10 MG tablet glipizide 10 mg tablet   HYDROcodone-acetaminophen (NORCO) 5-325 mg tablet hydrocodone 5 mg-acetaminophen 325 mg tablet   lisinopriL (ZESTRIL) 5 MG tablet lisinopril 5 mg tablet   methocarbamoL (ROBAXIN) 500 MG tablet methocarbamol 500 mg tablet   phentermine (ADIPEX-P) 15 MG capsule Take 15 mg by mouth every morning before breakfast   pioglitazone (ACTOS) 45 MG tablet pioglitazone 45 mg tablet   traMADoL (ULTRAM) 50 mg tablet tramadol 50 mg tablet   zinc citrate-phytase (ZYTAZE) 25-500 mg capsule Take by mouth   diclofenac (VOLTAREN) 1 % topical gel  APPLY 2 GRAMS TO AFFECTED AREAS TWICE A DAY   montelukast (SINGULAIR) 10 mg  tablet Take 10 mg by mouth at bedtime   No current facility-administered medications on file prior to visit.   Family History  Problem Relation Age of Onset   Obesity Mother   High blood pressure (Hypertension) Mother   Diabetes Mother   Bone cancer Mother   Obesity Father   High blood pressure (Hypertension) Father   Diabetes Father    Social History   Tobacco Use  Smoking Status Never  Smokeless Tobacco Never    Social History   Socioeconomic History   Marital status: Divorced  Tobacco Use   Smoking status: Never   Smokeless tobacco: Never  Vaping Use   Vaping Use: Never used  Substance and Sexual Activity   Alcohol use: Never   Drug use: Never   Objective:   Vitals:  BP: 122/72  Pulse: 95  Temp: 36.6 C (97.8 F)  SpO2: 98%  Weight: 97.7 kg (215 lb 6.4 oz)  Height: 171.5 cm (5' 7.5")   Body mass index is 33.24 kg/m.  Physical Exam   GENERAL APPEARANCE Development: normal Nutritional status: normal Gross deformities: none  SKIN Rash, lesions, ulcers: none Induration, erythema: none Nodules: none palpable  EYES Conjunctiva and lids: normal Pupils: equal and reactive Iris: normal bilaterally  EARS, NOSE, MOUTH, THROAT External ears: no lesion or deformity External nose: no lesion or deformity Hearing: grossly normal Due to Covid-19 pandemic, patient is wearing a mask.  NECK Symmetric: yes Trachea: midline Thyroid: There is a dominant palpable nodule centered over the trachea in the thyroid isthmus measuring at least 3 cm in greatest dimension. It is smooth, mobile, and nontender. There is slight nodularity bilaterally without discrete or dominant mass. There is no associated lymphadenopathy.  CHEST Respiratory effort: normal Retraction or accessory muscle use: no Breath sounds: normal bilaterally Rales, rhonchi, wheeze: none  CARDIOVASCULAR Auscultation: regular rhythm, normal rate Murmurs: none Pulses: radial pulse 2+  palpable Lower extremity edema: none  MUSCULOSKELETAL Station and gait: normal Digits and nails: no clubbing or cyanosis Muscle strength: grossly normal all extremities Range of motion: grossly normal all extremities Deformity: none  LYMPHATIC Cervical: none palpable Supraclavicular: none palpable  PSYCHIATRIC Oriented to person, place, and time: yes Mood and affect: normal for situation Judgment and insight: appropriate for situation  Assessment and Plan:   Neoplasm of uncertain behavior of thyroid gland  Multiple thyroid nodules   Patient is referred by Dr. Delrae Rend for consideration for thyroidectomy for management of thyroid neoplasm of uncertain behavior suspicious for papillary thyroid carcinoma.  Patient provided with a copy of "The Thyroid Book: Medical and Surgical Treatment of Thyroid Problems", published by [employer], 16 pages. Book reviewed and explained to patient during visit today.  Patient and I reviewed her ultrasound and her biopsy reports. We discussed options for management. I agree with Dr. Buddy Duty that total thyroidectomy would be the procedure of choice. I do not think she will require a lymph node dissection but if there are any abnormal lymph nodes identified at the time of surgery we will certainly remove those. Today we discussed the size and location of the surgical incision. We discussed the risk and benefits of the procedure including the risk of recurrent laryngeal nerve injury and injury to parathyroid glands. We discussed the hospitalization to be anticipated as well as the postoperative recovery. We discussed the need for lifelong thyroid hormone replacement. We discussed the potential need for  radioactive iodine treatment. The patient understands and wishes to proceed with surgery in the near future.  The risks and benefits of the procedure have been discussed at length with the patient. The patient understands the proposed procedure, potential  alternative treatments, and the course of recovery to be expected. All of the patient's questions have been answered at this time. The patient wishes to proceed with surgery.   Armandina Gemma, MD [facility] Office: [phone]

## 2021-09-23 NOTE — Anesthesia Preprocedure Evaluation (Addendum)
Anesthesia Evaluation  Patient identified by MRN, date of birth, ID band Patient awake    Reviewed: Allergy & Precautions, NPO status , Patient's Chart, lab work & pertinent test results  Airway Mallampati: I       Dental  (+) Edentulous Upper, Edentulous Lower   Pulmonary neg pulmonary ROS,    Pulmonary exam normal        Cardiovascular hypertension, Pt. on medications Normal cardiovascular exam     Neuro/Psych negative neurological ROS  negative psych ROS   GI/Hepatic negative GI ROS, Neg liver ROS, (+)     (-) substance abuse  ,   Endo/Other  diabetes, Well Controlled, Type 2, Oral Hypoglycemic Agents  Renal/GU negative Renal ROS  negative genitourinary   Musculoskeletal  (+) Arthritis , Osteoarthritis,    Abdominal (+) + obese,   Peds negative pediatric ROS (+)  Hematology negative hematology ROS (+)   Anesthesia Other Findings   Reproductive/Obstetrics                           Anesthesia Physical Anesthesia Plan  ASA: 2  Anesthesia Plan: General   Post-op Pain Management: Dilaudid IV   Induction: Intravenous  PONV Risk Score and Plan: 4 or greater and Ondansetron, Midazolam and Scopolamine patch - Pre-op  Airway Management Planned: Oral ETT  Additional Equipment: None  Intra-op Plan:   Post-operative Plan: Extubation in OR  Informed Consent: I have reviewed the patients History and Physical, chart, labs and discussed the procedure including the risks, benefits and alternatives for the proposed anesthesia with the patient or authorized representative who has indicated his/her understanding and acceptance.     Dental advisory given  Plan Discussed with: CRNA  Anesthesia Plan Comments:        Anesthesia Quick Evaluation

## 2021-09-24 ENCOUNTER — Other Ambulatory Visit: Payer: Self-pay

## 2021-09-24 ENCOUNTER — Encounter (HOSPITAL_COMMUNITY): Payer: Self-pay | Admitting: Surgery

## 2021-09-24 ENCOUNTER — Ambulatory Visit (HOSPITAL_BASED_OUTPATIENT_CLINIC_OR_DEPARTMENT_OTHER): Payer: Medicare HMO | Admitting: Certified Registered Nurse Anesthetist

## 2021-09-24 ENCOUNTER — Ambulatory Visit (HOSPITAL_COMMUNITY)
Admission: RE | Admit: 2021-09-24 | Discharge: 2021-09-25 | Disposition: A | Discharge status: Home / Self Care | Payer: Medicare HMO | Attending: Surgery | Admitting: Surgery

## 2021-09-24 ENCOUNTER — Ambulatory Visit (HOSPITAL_COMMUNITY): Payer: Medicare HMO | Admitting: Certified Registered Nurse Anesthetist

## 2021-09-24 ENCOUNTER — Encounter (HOSPITAL_COMMUNITY)
Admission: RE | Disposition: A | Discharge status: Home / Self Care | Payer: Self-pay | Source: Home / Self Care | Attending: Surgery

## 2021-09-24 DIAGNOSIS — C73 Malignant neoplasm of thyroid gland: Secondary | ICD-10-CM | POA: Diagnosis not present

## 2021-09-24 DIAGNOSIS — I10 Essential (primary) hypertension: Secondary | ICD-10-CM | POA: Diagnosis not present

## 2021-09-24 DIAGNOSIS — Z6833 Body mass index (BMI) 33.0-33.9, adult: Secondary | ICD-10-CM | POA: Insufficient documentation

## 2021-09-24 DIAGNOSIS — E669 Obesity, unspecified: Secondary | ICD-10-CM | POA: Diagnosis not present

## 2021-09-24 DIAGNOSIS — Z7984 Long term (current) use of oral hypoglycemic drugs: Secondary | ICD-10-CM | POA: Insufficient documentation

## 2021-09-24 DIAGNOSIS — E042 Nontoxic multinodular goiter: Secondary | ICD-10-CM | POA: Insufficient documentation

## 2021-09-24 DIAGNOSIS — D497 Neoplasm of unspecified behavior of endocrine glands and other parts of nervous system: Secondary | ICD-10-CM | POA: Diagnosis not present

## 2021-09-24 DIAGNOSIS — E119 Type 2 diabetes mellitus without complications: Secondary | ICD-10-CM | POA: Diagnosis not present

## 2021-09-24 DIAGNOSIS — D440 Neoplasm of uncertain behavior of thyroid gland: Secondary | ICD-10-CM | POA: Diagnosis not present

## 2021-09-24 DIAGNOSIS — M1990 Unspecified osteoarthritis, unspecified site: Secondary | ICD-10-CM | POA: Diagnosis not present

## 2021-09-24 HISTORY — PX: THYROIDECTOMY: SHX17

## 2021-09-24 LAB — TYPE AND SCREEN
ABO/RH(D): B POS
Antibody Screen: NEGATIVE

## 2021-09-24 LAB — GLUCOSE, CAPILLARY
Glucose-Capillary: 202 mg/dL — ABNORMAL HIGH (ref 70–99)
Glucose-Capillary: 226 mg/dL — ABNORMAL HIGH (ref 70–99)
Glucose-Capillary: 189 mg/dL — ABNORMAL HIGH (ref 70–99)
Glucose-Capillary: 167 mg/dL — ABNORMAL HIGH (ref 70–99)

## 2021-09-24 SURGERY — THYROIDECTOMY
Anesthesia: General

## 2021-09-24 MED ORDER — HYDROMORPHONE HCL 1 MG/ML IJ SOLN
INTRAMUSCULAR | Status: AC
  Filled 2021-09-24: qty 2

## 2021-09-24 MED ORDER — CHLORHEXIDINE GLUCONATE CLOTH 2 % EX PADS: 6.0000 | MEDICATED_PAD | Freq: Once | CUTANEOUS | Status: DC

## 2021-09-24 MED ORDER — ONDANSETRON 4 MG PO TBDP: 4.0000 mg | ORAL_TABLET | Freq: Four times a day (QID) | ORAL | Status: DC | PRN

## 2021-09-24 MED ORDER — GLIPIZIDE 10 MG PO TABS
10.0000 mg | ORAL_TABLET | Freq: Two times a day (BID) | ORAL | Status: DC
  Administered 2021-09-24 – 2021-09-25 (×2): 10 mg via ORAL
  Filled 2021-09-24 (×3): qty 1

## 2021-09-24 MED ORDER — LISINOPRIL 5 MG PO TABS
5.0000 mg | ORAL_TABLET | Freq: Every day | ORAL | Status: DC
Start: 2021-09-24 — End: 2021-09-25
  Administered 2021-09-24 – 2021-09-25 (×2): 5 mg via ORAL
  Filled 2021-09-24 (×2): qty 1

## 2021-09-24 MED ORDER — DEXAMETHASONE SODIUM PHOSPHATE 10 MG/ML IJ SOLN
INTRAMUSCULAR | Status: DC | PRN
  Administered 2021-09-24: 10 mg via INTRAVENOUS

## 2021-09-24 MED ORDER — PIOGLITAZONE HCL 30 MG PO TABS
45.0000 mg | ORAL_TABLET | Freq: Every day | ORAL | Status: DC
  Administered 2021-09-25: 45 mg via ORAL
  Filled 2021-09-24: qty 1

## 2021-09-24 MED ORDER — ACETAMINOPHEN 10 MG/ML IV SOLN
INTRAVENOUS | Status: AC
  Administered 2021-09-24: 1000 mg via INTRAVENOUS
  Filled 2021-09-24: qty 100

## 2021-09-24 MED ORDER — PROPOFOL 10 MG/ML IV BOLUS
INTRAVENOUS | Status: AC
  Filled 2021-09-24: qty 20

## 2021-09-24 MED ORDER — ACETAMINOPHEN 650 MG RE SUPP: 650.0000 mg | Freq: Four times a day (QID) | RECTAL | Status: DC | PRN

## 2021-09-24 MED ORDER — 0.9 % SODIUM CHLORIDE (POUR BTL) OPTIME
TOPICAL | Status: DC | PRN
  Administered 2021-09-24: 1000 mL

## 2021-09-24 MED ORDER — CANAGLIFLOZIN 100 MG PO TABS
300.0000 mg | ORAL_TABLET | Freq: Every day | ORAL | Status: DC
  Administered 2021-09-24 – 2021-09-25 (×2): 300 mg via ORAL
  Filled 2021-09-24 (×2): qty 3

## 2021-09-24 MED ORDER — SUGAMMADEX SODIUM 500 MG/5ML IV SOLN
INTRAVENOUS | Status: DC | PRN
  Administered 2021-09-24: 400 mg via INTRAVENOUS

## 2021-09-24 MED ORDER — LIDOCAINE 2% (20 MG/ML) 5 ML SYRINGE
INTRAMUSCULAR | Status: DC | PRN
  Administered 2021-09-24: 80 mg via INTRAVENOUS

## 2021-09-24 MED ORDER — KETOROLAC TROMETHAMINE 15 MG/ML IJ SOLN
INTRAMUSCULAR | Status: AC
  Administered 2021-09-24: 15 mg
  Filled 2021-09-24: qty 1

## 2021-09-24 MED ORDER — SUGAMMADEX SODIUM 500 MG/5ML IV SOLN
INTRAVENOUS | Status: AC
  Filled 2021-09-24: qty 5

## 2021-09-24 MED ORDER — SCOPOLAMINE 1 MG/3DAYS TD PT72
MEDICATED_PATCH | TRANSDERMAL | Status: DC | PRN
  Administered 2021-09-24: 1 via TRANSDERMAL

## 2021-09-24 MED ORDER — HEMOSTATIC AGENTS (NO CHARGE) OPTIME
TOPICAL | Status: DC | PRN
  Administered 2021-09-24: 1 via TOPICAL

## 2021-09-24 MED ORDER — ROCURONIUM BROMIDE 10 MG/ML (PF) SYRINGE
PREFILLED_SYRINGE | INTRAVENOUS | Status: DC | PRN
  Administered 2021-09-24 (×2): 20 mg via INTRAVENOUS
  Administered 2021-09-24: 60 mg via INTRAVENOUS

## 2021-09-24 MED ORDER — KETOROLAC TROMETHAMINE 30 MG/ML IJ SOLN
15.0000 mg | Freq: Once | INTRAMUSCULAR | Status: DC | PRN
Start: 2021-09-24 — End: 2021-09-24

## 2021-09-24 MED ORDER — ONDANSETRON HCL 4 MG/2ML IJ SOLN
INTRAMUSCULAR | Status: AC
  Filled 2021-09-24: qty 4

## 2021-09-24 MED ORDER — PHENYLEPHRINE 40 MCG/ML (10ML) SYRINGE FOR IV PUSH (FOR BLOOD PRESSURE SUPPORT)
PREFILLED_SYRINGE | INTRAVENOUS | Status: AC
  Filled 2021-09-24: qty 10

## 2021-09-24 MED ORDER — KETOROLAC TROMETHAMINE 30 MG/ML IJ SOLN: 30.0000 mg | Freq: Once | INTRAMUSCULAR | Status: DC | PRN

## 2021-09-24 MED ORDER — FENTANYL CITRATE (PF) 100 MCG/2ML IJ SOLN
INTRAMUSCULAR | Status: AC
  Filled 2021-09-24: qty 2

## 2021-09-24 MED ORDER — ORAL CARE MOUTH RINSE: 15.0000 mL | Freq: Once | OROMUCOSAL | Status: AC

## 2021-09-24 MED ORDER — SODIUM CHLORIDE 0.45 % IV SOLN: INTRAVENOUS | Status: DC

## 2021-09-24 MED ORDER — MIDAZOLAM HCL 2 MG/2ML IJ SOLN
INTRAMUSCULAR | Status: AC
  Filled 2021-09-24: qty 2

## 2021-09-24 MED ORDER — INSULIN ASPART 100 UNIT/ML IJ SOLN
0.0000 [IU] | Freq: Three times a day (TID) | INTRAMUSCULAR | Status: DC
  Administered 2021-09-24: 3 [IU] via SUBCUTANEOUS
  Administered 2021-09-25: 2 [IU] via SUBCUTANEOUS

## 2021-09-24 MED ORDER — HYDROMORPHONE HCL 1 MG/ML IJ SOLN
1.0000 mg | INTRAMUSCULAR | Status: DC | PRN
  Administered 2021-09-24 (×2): 1 mg via INTRAVENOUS
  Filled 2021-09-24 (×2): qty 1

## 2021-09-24 MED ORDER — MEPERIDINE HCL 50 MG/ML IJ SOLN: 6.2500 mg | INTRAMUSCULAR | Status: DC | PRN

## 2021-09-24 MED ORDER — LIDOCAINE HCL (PF) 2 % IJ SOLN
INTRAMUSCULAR | Status: AC
  Filled 2021-09-24: qty 30

## 2021-09-24 MED ORDER — ONDANSETRON HCL 4 MG/2ML IJ SOLN
4.0000 mg | Freq: Four times a day (QID) | INTRAMUSCULAR | Status: DC | PRN
  Administered 2021-09-24: 4 mg via INTRAVENOUS
  Filled 2021-09-24: qty 2

## 2021-09-24 MED ORDER — PHENYLEPHRINE 40 MCG/ML (10ML) SYRINGE FOR IV PUSH (FOR BLOOD PRESSURE SUPPORT)
PREFILLED_SYRINGE | INTRAVENOUS | Status: DC | PRN
  Administered 2021-09-24 (×7): 80 ug via INTRAVENOUS
  Administered 2021-09-24: 40 ug via INTRAVENOUS
  Administered 2021-09-24 (×2): 80 ug via INTRAVENOUS

## 2021-09-24 MED ORDER — CHLORHEXIDINE GLUCONATE 0.12 % MT SOLN
15.0000 mL | Freq: Once | OROMUCOSAL | Status: AC
  Administered 2021-09-24: 15 mL via OROMUCOSAL

## 2021-09-24 MED ORDER — MONTELUKAST SODIUM 10 MG PO TABS
10.0000 mg | ORAL_TABLET | Freq: Every day | ORAL | Status: DC
  Administered 2021-09-24: 10 mg via ORAL
  Filled 2021-09-24: qty 1

## 2021-09-24 MED ORDER — MIDAZOLAM HCL 5 MG/5ML IJ SOLN
INTRAMUSCULAR | Status: DC | PRN
  Administered 2021-09-24: 2 mg via INTRAVENOUS

## 2021-09-24 MED ORDER — CALCIUM CARBONATE 1250 (500 CA) MG PO TABS
2.0000 | ORAL_TABLET | Freq: Three times a day (TID) | ORAL | Status: DC
  Administered 2021-09-24 – 2021-09-25 (×2): 1000 mg via ORAL
  Filled 2021-09-24 (×2): qty 1

## 2021-09-24 MED ORDER — ESMOLOL HCL 100 MG/10ML IV SOLN
INTRAVENOUS | Status: AC
  Filled 2021-09-24: qty 10

## 2021-09-24 MED ORDER — DEXMEDETOMIDINE (PRECEDEX) IN NS 20 MCG/5ML (4 MCG/ML) IV SYRINGE
PREFILLED_SYRINGE | INTRAVENOUS | Status: DC | PRN
  Administered 2021-09-24: 4 ug via INTRAVENOUS
  Administered 2021-09-24: 8 ug via INTRAVENOUS
  Administered 2021-09-24: 4 ug via INTRAVENOUS

## 2021-09-24 MED ORDER — HYDROMORPHONE HCL 1 MG/ML IJ SOLN
0.2500 mg | INTRAMUSCULAR | Status: DC | PRN
  Administered 2021-09-24 (×4): 0.5 mg via INTRAVENOUS

## 2021-09-24 MED ORDER — ONDANSETRON HCL 4 MG/2ML IJ SOLN
INTRAMUSCULAR | Status: DC | PRN
  Administered 2021-09-24: 4 mg via INTRAVENOUS

## 2021-09-24 MED ORDER — ACETAMINOPHEN 325 MG PO TABS: 650.0000 mg | ORAL_TABLET | Freq: Four times a day (QID) | ORAL | Status: DC | PRN

## 2021-09-24 MED ORDER — FENTANYL CITRATE PF 50 MCG/ML IJ SOSY: 25.0000 ug | PREFILLED_SYRINGE | INTRAMUSCULAR | Status: DC | PRN

## 2021-09-24 MED ORDER — PROPOFOL 10 MG/ML IV BOLUS
INTRAVENOUS | Status: DC | PRN
  Administered 2021-09-24: 160 mg via INTRAVENOUS

## 2021-09-24 MED ORDER — LACTATED RINGERS IV SOLN: INTRAVENOUS | Status: DC

## 2021-09-24 MED ORDER — FENTANYL CITRATE PF 50 MCG/ML IJ SOSY
50.0000 ug | PREFILLED_SYRINGE | INTRAMUSCULAR | Status: AC | PRN
  Administered 2021-09-24 (×2): 50 ug via INTRAVENOUS

## 2021-09-24 MED ORDER — DEXAMETHASONE SODIUM PHOSPHATE 10 MG/ML IJ SOLN
INTRAMUSCULAR | Status: AC
  Filled 2021-09-24: qty 2

## 2021-09-24 MED ORDER — TRAMADOL HCL 50 MG PO TABS: 50.0000 mg | ORAL_TABLET | Freq: Four times a day (QID) | ORAL | Status: DC | PRN

## 2021-09-24 MED ORDER — FENTANYL CITRATE PF 50 MCG/ML IJ SOSY
PREFILLED_SYRINGE | INTRAMUSCULAR | Status: AC
  Filled 2021-09-24: qty 2

## 2021-09-24 MED ORDER — GABAPENTIN 100 MG PO CAPS
100.0000 mg | ORAL_CAPSULE | Freq: Every day | ORAL | Status: DC
  Administered 2021-09-24: 100 mg via ORAL
  Filled 2021-09-24: qty 1

## 2021-09-24 MED ORDER — OXYCODONE HCL 5 MG PO TABS
5.0000 mg | ORAL_TABLET | ORAL | Status: DC | PRN
  Administered 2021-09-24 – 2021-09-25 (×2): 10 mg via ORAL
  Filled 2021-09-24 (×2): qty 2

## 2021-09-24 MED ORDER — CEFAZOLIN SODIUM-DEXTROSE 2-4 GM/100ML-% IV SOLN
2.0000 g | INTRAVENOUS | Status: AC
  Administered 2021-09-24: 2 g via INTRAVENOUS
  Filled 2021-09-24: qty 100

## 2021-09-24 MED ORDER — ROCURONIUM BROMIDE 10 MG/ML (PF) SYRINGE
PREFILLED_SYRINGE | INTRAVENOUS | Status: AC
  Filled 2021-09-24: qty 10

## 2021-09-24 MED ORDER — IBUPROFEN 400 MG PO TABS: 600.0000 mg | ORAL_TABLET | Freq: Four times a day (QID) | ORAL | Status: DC | PRN

## 2021-09-24 MED ORDER — FENTANYL CITRATE (PF) 100 MCG/2ML IJ SOLN
INTRAMUSCULAR | Status: DC | PRN
  Administered 2021-09-24: 100 ug via INTRAVENOUS
  Administered 2021-09-24 (×2): 50 ug via INTRAVENOUS

## 2021-09-24 MED ORDER — SCOPOLAMINE 1 MG/3DAYS TD PT72
MEDICATED_PATCH | TRANSDERMAL | Status: AC
  Filled 2021-09-24: qty 1

## 2021-09-24 MED ORDER — ACETAMINOPHEN 10 MG/ML IV SOLN: 1000.0000 mg | Freq: Once | INTRAVENOUS | Status: DC | PRN

## 2021-09-24 MED ORDER — ESMOLOL HCL 100 MG/10ML IV SOLN
INTRAVENOUS | Status: DC | PRN
  Administered 2021-09-24 (×2): 10 mg via INTRAVENOUS
  Administered 2021-09-24: 20 mg via INTRAVENOUS
  Administered 2021-09-24: 30 mg via INTRAVENOUS

## 2021-09-24 MED ORDER — PROMETHAZINE HCL 25 MG/ML IJ SOLN: 6.2500 mg | INTRAMUSCULAR | Status: DC | PRN

## 2021-09-24 MED ORDER — PHENYLEPHRINE HCL-NACL 20-0.9 MG/250ML-% IV SOLN
INTRAVENOUS | Status: AC
  Filled 2021-09-24: qty 500

## 2021-09-24 SURGICAL SUPPLY — 34 items
ADH SKN CLS APL DERMABOND .7 (GAUZE/BANDAGES/DRESSINGS) ×1
APL PRP STRL LF DISP 70% ISPRP (MISCELLANEOUS) ×1
ATTRACTOMAT 16X20 MAGNETIC DRP (DRAPES) ×2 IMPLANT
BAG COUNTER SPONGE SURGICOUNT (BAG) ×2 IMPLANT
BAG SPNG CNTER NS LX DISP (BAG) ×1
BLADE SURG 15 STRL LF DISP TIS (BLADE) ×1 IMPLANT
BLADE SURG 15 STRL SS (BLADE) ×2
CHLORAPREP W/TINT 26 (MISCELLANEOUS) ×2 IMPLANT
CLIP TI MEDIUM 6 (CLIP) ×5 IMPLANT
CLIP TI WIDE RED SMALL 6 (CLIP) ×7 IMPLANT
COVER SURGICAL LIGHT HANDLE (MISCELLANEOUS) ×2 IMPLANT
DERMABOND ADVANCED (GAUZE/BANDAGES/DRESSINGS) ×1
DERMABOND ADVANCED .7 DNX12 (GAUZE/BANDAGES/DRESSINGS) ×1 IMPLANT
DRAPE LAPAROTOMY T 98X78 PEDS (DRAPES) ×2 IMPLANT
DRAPE UTILITY XL STRL (DRAPES) ×2 IMPLANT
ELECT PENCIL ROCKER SW 15FT (MISCELLANEOUS) ×2 IMPLANT
ELECT REM PT RETURN 15FT ADLT (MISCELLANEOUS) ×2 IMPLANT
GAUZE 4X4 16PLY ~~LOC~~+RFID DBL (SPONGE) ×2 IMPLANT
GLOVE SURG SYN 7.5  E (GLOVE) ×4
GLOVE SURG SYN 7.5 E (GLOVE) ×2 IMPLANT
GLOVE SURG SYN 7.5 PF PI (GLOVE) ×2 IMPLANT
GOWN STRL REUS W/TWL XL LVL3 (GOWN DISPOSABLE) ×4 IMPLANT
HEMOSTAT SURGICEL 2X4 FIBR (HEMOSTASIS) ×2 IMPLANT
ILLUMINATOR WAVEGUIDE N/F (MISCELLANEOUS) ×2 IMPLANT
KIT BASIN OR (CUSTOM PROCEDURE TRAY) ×2 IMPLANT
KIT TURNOVER KIT A (KITS) IMPLANT
PACK BASIC VI WITH GOWN DISP (CUSTOM PROCEDURE TRAY) ×2 IMPLANT
SHEARS HARMONIC 9CM CVD (BLADE) ×2 IMPLANT
SUT MNCRL AB 4-0 PS2 18 (SUTURE) ×2 IMPLANT
SUT VIC AB 3-0 SH 18 (SUTURE) ×4 IMPLANT
SYR BULB IRRIG 60ML STRL (SYRINGE) ×2 IMPLANT
TOWEL OR 17X26 10 PK STRL BLUE (TOWEL DISPOSABLE) ×2 IMPLANT
TOWEL OR NON WOVEN STRL DISP B (DISPOSABLE) ×2 IMPLANT
TUBING CONNECTING 10 (TUBING) ×2 IMPLANT

## 2021-09-24 NOTE — Op Note (Signed)
Procedure Note  Pre-operative Diagnosis:  thyroid neoplasm of uncertain behavior, multiple thyroid nodules  Post-operative Diagnosis:  same  Surgeon:  Armandina Gemma, MD  Assistant:  Carlena Hurl, PA-C   Procedure:  Total thyroidectomy  Anesthesia:  General  Estimated Blood Loss:  minimal  Drains: none         Specimen: thyroid to pathology  Indications:  Patient is referred by Dr. 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 Patient's primary care physician is Dr. Shawnie Dapper. Patient had engaged in a weight loss program. After substantial weight loss on physical examination she was noted by her primary care physician to have a thyroid nodule. Patient underwent ultrasound examination on June 04, 2021. This showed a mildly enlarged thyroid gland containing multiple thyroid nodules. There was a dominant nodule in the thyroid isthmus measuring 3.2 cm which was felt to be mildly suspicious for malignancy. Patient underwent ultrasound-guided fine-needle aspiration biopsy on July 18, 2021. This showed cytologic atypia suspicious for malignancy, specifically suspicious for papillary thyroid carcinoma, Bethesda category V. TSH level is mildly elevated at 6.21. Patient is not taking levothyroxine. Patient was seen and evaluated by Dr. Delrae Rend. Recommendation was made for thyroidectomy and the patient was referred to my practice.   Procedure Details: Procedure was done in OR #4 at the [facility]. The patient was brought to the operating room and placed in a supine position on the operating room table. Following administration of general anesthesia, the patient was positioned and then prepped and draped in the usual aseptic fashion. After ascertaining that an adequate level of anesthesia had been achieved, a small Kocher incision was made with #15 blade. Dissection was carried through  subcutaneous tissues and platysma.Hemostasis was achieved with the electrocautery. Skin flaps were elevated cephalad and caudad from the thyroid notch to the sternal notch. A Mahorner self-retaining retractor was placed for exposure. Strap muscles were incised in the midline and dissection was begun on the left side.  Strap muscles were reflected laterally.  Left thyroid lobe was moderately enlarged and multinodular.  The left lobe was gently mobilized with blunt dissection. Superior pole vessels were dissected out and divided individually between small and medium ligaclips with the harmonic scalpel. The thyroid lobe was rolled anteriorly. Branches of the inferior thyroid artery were divided between small ligaclips with the harmonic scalpel. Inferior venous tributaries were divided between ligaclips. Both the superior and inferior parathyroid glands were identified and preserved on their vascular pedicles. The recurrent laryngeal nerve was identified and preserved along its course. The ligament of Gwenlyn Found was released with the electrocautery and the gland was mobilized onto the anterior trachea. Isthmus was mobilized across the midline. There was no significant pyramidal lobe present. Dry pack was placed in the left neck.  The right thyroid lobe was gently mobilized with blunt dissection. Right thyroid lobe was moderately enlarged and multinodular. Superior pole vessels were dissected out and divided between small and medium ligaclips with the Harmonic scalpel. Superior parathyroid was identified and preserved. Inferior venous tributaries were divided between medium ligaclips with the harmonic scalpel. The right thyroid lobe was rolled anteriorly and the branches of the inferior thyroid artery divided between small ligaclips. The right recurrent laryngeal nerve was identified and preserved along its course. The ligament of Gwenlyn Found was released with the electrocautery. The right thyroid lobe was mobilized onto the  anterior trachea and the remainder of the thyroid was dissected off  the anterior trachea and the thyroid was completely excised. A suture was used to mark the left lobe. The entire thyroid gland was submitted to pathology for review.  The neck was irrigated with warm saline. Fibrillar was placed throughout the operative field. Strap muscles were approximated in the midline with interrupted 3-0 Vicryl sutures. Platysma was closed with interrupted 3-0 Vicryl sutures. Skin was closed with a running 4-0 Monocryl subcuticular suture. Wound was washed and Dermabond was applied. The patient was awakened from anesthesia and brought to the recovery room. The patient tolerated the procedure well.   Armandina Gemma, MD [facility] Office: [phone]

## 2021-09-24 NOTE — Transfer of Care (Signed)
Immediate Anesthesia Transfer of Care Note  Patient: Claudia [name]  Procedure(s) Performed: Procedure(s): TOTAL THYROIDECTOMY (N/A)  Patient Location: PACU  Anesthesia Type:General  Level of Consciousness:  sedated, patient cooperative and responds to stimulation  Airway & Oxygen Therapy:Patient Spontanous Breathing and Patient connected to face mask oxgen  Post-op Assessment:  Report given to PACU RN and Post -op Vital signs reviewed and stable  Post vital signs:  Reviewed and stable  Last Vitals:  Vitals:   09/24/21 0559  BP: 115/64  Pulse: 74  Resp: 16  Temp: 36.8 C  SpO2: 585%    Complications: No apparent anesthesia complications

## 2021-09-24 NOTE — Anesthesia Procedure Notes (Signed)
Procedure Name: Intubation Date/Time: 09/24/2021 7:45 AM Performed by: Lavina Hamman, CRNA Pre-anesthesia Checklist: Patient identified, Emergency Drugs available, Suction available, Patient being monitored and Timeout performed Patient Re-evaluated:Patient Re-evaluated prior to induction Oxygen Delivery Method: Circle system utilized Preoxygenation: Pre-oxygenation with 100% oxygen Induction Type: IV induction Ventilation: Mask ventilation without difficulty Laryngoscope Size: Mac and 4 Grade View: Grade I Tube type: Oral Tube size: 7.5 mm Number of attempts: 1 Airway Equipment and Method: Stylet Placement Confirmation: ETT inserted through vocal cords under direct vision, positive ETCO2, CO2 detector and breath sounds checked- equal and bilateral Secured at: 22 cm Tube secured with: Tape Dental Injury: Teeth and Oropharynx as per pre-operative assessment

## 2021-09-25 ENCOUNTER — Encounter (HOSPITAL_COMMUNITY): Payer: Self-pay | Admitting: Surgery

## 2021-09-25 DIAGNOSIS — E042 Nontoxic multinodular goiter: Secondary | ICD-10-CM | POA: Diagnosis not present

## 2021-09-25 DIAGNOSIS — E119 Type 2 diabetes mellitus without complications: Secondary | ICD-10-CM | POA: Diagnosis not present

## 2021-09-25 DIAGNOSIS — I10 Essential (primary) hypertension: Secondary | ICD-10-CM | POA: Diagnosis not present

## 2021-09-25 DIAGNOSIS — E669 Obesity, unspecified: Secondary | ICD-10-CM | POA: Diagnosis not present

## 2021-09-25 DIAGNOSIS — Z6833 Body mass index (BMI) 33.0-33.9, adult: Secondary | ICD-10-CM | POA: Diagnosis not present

## 2021-09-25 DIAGNOSIS — Z7984 Long term (current) use of oral hypoglycemic drugs: Secondary | ICD-10-CM | POA: Diagnosis not present

## 2021-09-25 DIAGNOSIS — M1990 Unspecified osteoarthritis, unspecified site: Secondary | ICD-10-CM | POA: Diagnosis not present

## 2021-09-25 DIAGNOSIS — C73 Malignant neoplasm of thyroid gland: Secondary | ICD-10-CM | POA: Diagnosis not present

## 2021-09-25 LAB — BASIC METABOLIC PANEL
Anion gap: 6 (ref 5–15)
BUN: 18 mg/dL (ref 8–23)
CO2: 25 mmol/L (ref 22–32)
Calcium: 8.4 mg/dL — ABNORMAL LOW (ref 8.9–10.3)
Chloride: 102 mmol/L (ref 98–111)
Creatinine, Ser: 0.88 mg/dL (ref 0.44–1.00)
GFR, Estimated: >60 mL/min (ref >60)
Glucose, Bld: 148 mg/dL — ABNORMAL HIGH (ref 70–99)
Potassium: 3.6 mmol/L (ref 3.5–5.1)
Sodium: 133 mmol/L — ABNORMAL LOW (ref 135–145)

## 2021-09-25 LAB — GLUCOSE, CAPILLARY: Glucose-Capillary: 144 mg/dL — ABNORMAL HIGH (ref 70–99)

## 2021-09-25 MED ORDER — CALCIUM CARBONATE ANTACID 500 MG PO CHEW: 2.0000 | CHEWABLE_TABLET | Freq: Three times a day (TID) | ORAL | 1 refills | Status: AC

## 2021-09-25 MED ORDER — OXYCODONE HCL 5 MG PO TABS: 5.0000 mg | ORAL_TABLET | Freq: Four times a day (QID) | ORAL | 0 refills | Status: DC | PRN

## 2021-09-25 MED ORDER — LEVOTHYROXINE SODIUM 88 MCG PO TABS: 88.0000 ug | ORAL_TABLET | Freq: Every day | ORAL | 2 refills | Status: DC

## 2021-09-25 NOTE — Plan of Care (Signed)
  Problem: Clinical Measurements: Goal: Will remain free from infection Outcome: Not Progressing   Problem: Clinical Measurements: Goal: Diagnostic test results will improve Outcome: Not Progressing   Problem: Clinical Measurements: Goal: Respiratory complications will improve Outcome: Not Progressing   Problem: Nutrition: Goal: Adequate nutrition will be maintained Outcome: Not Progressing   Problem: Pain Managment: Goal: General experience of comfort will improve Outcome: Not Progressing   

## 2021-09-25 NOTE — TOC Transition Note (Signed)
Transition of Care [facility]) - CM/SW Discharge Note   Patient Details  Name: JANNAE FAGERSTROM MRN: [id number] Date of Birth: 07/30/1956  Transition of Care [facility]) CM/SW Contact:  Leeroy Cha, RN Phone Number: 09/25/2021, 9:45 AM   Clinical Narrative:    Patient dcd to go home Orders checked for toc needs. Final next level of care: Home/Self Care Barriers to Discharge: No Barriers Identified   Patient Goals and CMS Choice Patient states their goals for this hospitalization and ongoing recovery are:: to go home CMS Medicare.gov Compare Post Acute Care list provided to:: Patient    Discharge Placement                       Discharge Plan and Services   Discharge Planning Services: CM Consult                                 Social Determinants of Health (SDOH) Interventions     Readmission Risk Interventions No flowsheet data found.

## 2021-09-25 NOTE — Discharge Instructions (Signed)
[facility] - Dr. Eviana Sibilia  THYROID & PARATHYROID SURGERY:  POST-OP INSTRUCTIONS  Always review the instruction sheet provided by the hospital nurse at discharge.  A prescription for pain medication may be sent to your pharmacy at the time of discharge.  Take your pain medication as prescribed.  If narcotic pain medicine is not needed, then you may take acetaminophen (Tylenol) or ibuprofen (Advil) as needed for pain or soreness.  Take your normal home medications as prescribed unless otherwise directed.  If you need a refill on your pain medication, please contact the office during regular business hours.  Prescriptions will not be processed by the office after 5:00PM or on weekends.  Start with a light diet upon arrival home, such as soup and crackers or toast.  Be sure to drink plenty of fluids.  Resume your normal diet the day after surgery.  Most patients will experience some swelling and bruising on the chest and neck area.  Ice packs will help for the first 48 hours after arriving home.  Swelling and bruising will take several days to resolve.   It is common to experience some constipation after surgery.  Increasing fluid intake and taking a stool softener (Colace) will usually help to prevent this problem.  A mild laxative (Milk of Magnesia or Miralax) should be taken according to package directions if there has been no bowel movement after 48 hours.  Dermabond glue covers your incision. This seals the wound and you may shower at any time. The Dermabond will remain in place for about a week.  You may gradually remove the glue when it loosens around the edges.  If you need to loosen the Dermabond for removal, apply a layer of Vaseline to the wound for 15 minutes and then remove with a Kleenex. Your sutures are under the skin and will not show - they will dissolve on their own.  You may resume light daily activities beginning the day after discharge (such as self-care,  walking, climbing stairs), gradually increasing activities as tolerated. You may have sexual intercourse when it is comfortable. Refrain from any heavy lifting or straining until approved by your doctor. You may drive when you no longer are taking prescription pain medication, you can comfortably wear a seatbelt, and you can safely maneuver your car and apply the brakes.  You will see your doctor in the office for a follow-up appointment approximately three weeks after your surgery.  Make sure that you call for this appointment within a day or two after you arrive home to insure a convenient appointment time. Please have any requested laboratory tests performed a few days prior to your office visit so that the results will be available at your follow up appointment.  WHEN TO CALL THE [facility] OFFICE: -- Fever greater than 101.5 -- Inability to urinate -- Nausea and/or vomiting - persistent -- Extreme swelling or bruising -- Continued bleeding from incision -- Increased pain, redness, or drainage from the incision -- Difficulty swallowing or breathing -- Muscle cramping or spasms -- Numbness or tingling in hands or around lips  The clinic staff is available to answer your questions during regular business hours.  Please don't hesitate to call and ask to speak to one of the nurses if you have concerns.  [facility] OFFICE: [phone] (24 hours)  Please sign up for MyChart accounts. This will allow you to communicate directly with my nurse or myself without having to call the office. It will also allow you   to view your test results. You will need to enroll in MyChart for my office ([facility]) and for the hospital ([place]).  Manhattan Mccuen, MD [facility] practice 

## 2021-09-25 NOTE — Discharge Summary (Signed)
Physician Discharge Summary   Patient ID: Claudia [name] MRN: [id number] DOB/AGE: Apr 03, 1956 66 y.o.  Admit date: 09/24/2021  Discharge date: 09/25/2021  Discharge Diagnoses:  Principal Problem:   Neoplasm of uncertain behavior of thyroid gland Active Problems:   Multiple thyroid nodules   Discharged Condition: good  Hospital Course: Patient was admitted for observation following thyroid surgery.  Post op course was uncomplicated.  Pain was well controlled.  Tolerated diet.  Post op calcium level on morning following surgery was 8.4 mg/dl.  Patient was prepared for discharge home on POD#1.  Consults: None  Treatments: surgery: total thyroidectomy  Discharge Exam: Blood pressure (!) 93/42, pulse 69, temperature 98.2 F (36.8 C), temperature source Oral, resp. rate 18, height 5' 7.5" (1.715 m), weight 98 kg, SpO2 100 %. HEENT - clear Neck - wound dry an intact; mild STS; voice normal  Disposition: Home  Discharge Instructions     Diet - low sodium heart healthy   Complete by: As directed    Increase activity slowly   Complete by: As directed    No dressing needed   Complete by: As directed       Allergies as of 09/25/2021       Reactions   Metformin And Related Nausea And Vomiting, Other (See Comments)   GI upset        Medication List     TAKE these medications    Accu-Chek Guide test strip Generic drug: glucose blood CHECK BLOOD SUGAR 2 TIMES A DAY   Accu-Chek Guide w/Device Kit Accu-Chek Guide Glucose Meter  USE AS DIRECTED   Accu-Chek Softclix Lancets lancets CHECK BLOOD SUGAR 2 TIMES A DAY   alendronate 70 MG tablet Commonly known as: Fosamax Take 1 tablet (70 mg total) by mouth once a week. Take with a full glass of water on an empty stomach.   atorvastatin 20 MG tablet Commonly known as: LIPITOR Take 1 tablet (20 mg total) by mouth daily.   calcium carbonate 500 MG chewable tablet Commonly known as: Tums Chew 2 tablets (400 mg of  elemental calcium total) by mouth 3 (three) times daily.   canagliflozin 300 MG Tabs tablet Commonly known as: Invokana Take 1 tablet (300 mg total) by mouth daily.   cholecalciferol 25 MCG (1000 UNIT) tablet Commonly known as: VITAMIN D3 Take 1,000 Units by mouth daily.   cyanocobalamin 1000 MCG/ML injection Commonly known as: (VITAMIN B-12) Inject 1,000 mcg into the muscle once a week.   diclofenac Sodium 1 % Gel Commonly known as: VOLTAREN APPLY 2 GRAMS TO AFFECTED AREAS TWICE A DAY What changed: See the new instructions.   fluticasone 50 MCG/ACT nasal spray Commonly known as: FLONASE USE 2 SPRAYS IN EACH NOSTRIL DAILY.   gabapentin 100 MG capsule Commonly known as: NEURONTIN Take 1 capsule (100 mg total) by mouth at bedtime.   glipiZIDE 10 MG tablet Commonly known as: GLUCOTROL 1 tab po bid What changed:  how much to take how to take this when to take this   HYDROcodone-acetaminophen 5-325 MG tablet Commonly known as: NORCO/VICODIN TAKE ONE TABLET THREE TIMES DAILY AS NEEDED   levothyroxine 88 MCG tablet Commonly known as: Synthroid Take 1 tablet (88 mcg total) by mouth daily before breakfast.   lisinopril 5 MG tablet Commonly known as: ZESTRIL Take 1 tablet (5 mg total) by mouth daily.   methocarbamol 500 MG tablet Commonly known as: ROBAXIN 1-2 tabs po tid prn   montelukast 10 MG tablet  Commonly known as: SINGULAIR Take 1 tablet (10 mg total) by mouth at bedtime.   oxyCODONE 5 MG immediate release tablet Commonly known as: Oxy IR/ROXICODONE Take 1-2 tablets (5-10 mg total) by mouth every 6 (six) hours as needed for moderate pain.   Ozempic (0.25 or 0.5 MG/DOSE) 2 MG/1.5ML Sopn Generic drug: Semaglutide(0.25 or 0.5MG/DOS) 0.40m SQ q week   phentermine 15 MG capsule Take 15 mg by mouth 2 (two) times daily.   pioglitazone 45 MG tablet Commonly known as: ACTOS Take 1 tablet (45 mg total) by mouth daily.   polyethylene glycol powder 17 GM/SCOOP  powder Commonly known as: GLYCOLAX/MIRALAX Take 17 g by mouth at bedtime.   traMADol 50 MG tablet Commonly known as: ULTRAM TAKE 1 TO 2 TABLETS BY MOUTH TWICE A DAY AS NEEDED FOR PAIN   zinc gluconate 50 MG tablet Take 50 mg by mouth daily.               Discharge Care Instructions  (From admission, onward)           Start     Ordered   09/25/21 0000  No dressing needed        09/25/21 09038           Follow-up Information     GArmandina Gemma MD. Schedule an appointment as soon as possible for a visit in 3 week(s).   Specialty: General Surgery Why: For wound re-check Contact information: [phone]                 Javonne Louissaint, [facility] Office: [phone]  Signed: TArmandina Gemma2/14/2023, 9:42 AM

## 2021-09-27 ENCOUNTER — Encounter: Payer: Self-pay | Admitting: Family Medicine

## 2021-09-27 LAB — SURGICAL PATHOLOGY

## 2021-09-28 ENCOUNTER — Other Ambulatory Visit: Payer: Self-pay | Admitting: Family Medicine

## 2021-10-01 ENCOUNTER — Encounter: Payer: Self-pay | Admitting: Family Medicine

## 2021-10-01 DIAGNOSIS — D440 Neoplasm of uncertain behavior of thyroid gland: Secondary | ICD-10-CM | POA: Diagnosis not present

## 2021-10-01 DIAGNOSIS — E890 Postprocedural hypothyroidism: Secondary | ICD-10-CM | POA: Diagnosis not present

## 2021-10-01 NOTE — Progress Notes (Signed)
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  Results called to the patient.  She will see Dr. Buddy Duty later this week, and see me for a post op visit on March 1.  Doing well.  tmg  Armandina Gemma, [facility] [place] [facility] Office: [phone]

## 2021-10-01 NOTE — Telephone Encounter (Signed)
Requesting: [employer] Contract: 12/04/20 UDS: 12/04/20 Last Visit:09/10/21 Next Visit: 12/07/21 Last Refill: 08/31/21(90,0)  Please Advise. Medication pending

## 2021-10-01 NOTE — Anesthesia Postprocedure Evaluation (Signed)
Anesthesia Post Note  Patient: Claudia [name]  Procedure(s) Performed: TOTAL THYROIDECTOMY     Patient location during evaluation: PACU Anesthesia Type: General Level of consciousness: awake and sedated Pain management: pain level not controlled Vital Signs Assessment: post-procedure vital signs reviewed and stable Respiratory status: spontaneous breathing Cardiovascular status: stable Postop Assessment: no apparent nausea or vomiting Anesthetic complications: no   No notable events documented.  Last Vitals:  Vitals:   09/25/21 0121 09/25/21 0505  BP: (!) 95/53 (!) 93/42  Pulse: 84 69  Resp: 16 18  Temp: 36.9 C 36.8 C  SpO2: 99% 100%    Last Pain:  Vitals:   09/25/21 0832  TempSrc:   PainSc: 0-No pain   Pain Goal: Patients Stated Pain Goal: 1 (09/25/21 0602)                 Huston Foley

## 2021-10-03 NOTE — Progress Notes (Signed)
I do not know how to respond to this query.  Use of an incentive spirometer after general anesthesia and surgery has always been standard practice in this surgical community.  Armandina Gemma, MD [facility] [place] [facility] Office: [phone]

## 2021-10-05 ENCOUNTER — Telehealth: Payer: Self-pay | Admitting: Family Medicine

## 2021-10-05 DIAGNOSIS — E892 Postprocedural hypoparathyroidism: Secondary | ICD-10-CM | POA: Diagnosis not present

## 2021-10-05 DIAGNOSIS — C73 Malignant neoplasm of thyroid gland: Secondary | ICD-10-CM | POA: Diagnosis not present

## 2021-10-05 DIAGNOSIS — E890 Postprocedural hypothyroidism: Secondary | ICD-10-CM | POA: Diagnosis not present

## 2021-10-05 LAB — COMPREHENSIVE METABOLIC PANEL
Albumin: 3.8 (ref 3.5–5.0)
Calcium: 8.5 — AB (ref 8.7–10.7)

## 2021-10-05 NOTE — Telephone Encounter (Signed)
FYI: Pt has question about diabetic medication.  Pt cell: [phone]

## 2021-10-05 NOTE — Telephone Encounter (Signed)
Pt was unable to recall instructions for Ozempic. Pt was told the following:  Per Dr.McGowen: The initial dosing is just to make sure her body gets used to it and tolerates it.  After 1 month will increase dose and it should start to help her sugar better. Stop glipizide but continue on pioglitazone and Invokana.

## 2021-10-18 ENCOUNTER — Telehealth: Payer: Self-pay

## 2021-10-18 DIAGNOSIS — E119 Type 2 diabetes mellitus without complications: Secondary | ICD-10-CM

## 2021-10-18 NOTE — Telephone Encounter (Signed)
Patient had her thyroid removed on 2/13.  She is wanting to know if there are any changes to her meds because of potential side effects. ? ?She also needs a new diabetic meter.  The one she has is no longer working. ?Please call into [facility] ? ?Patient can be reached at [phone] ?

## 2021-10-18 NOTE — Telephone Encounter (Signed)
Spoke with pt regarding Ozempic side effects for causing cancerous tumors and taking phentermine for diet. Pt and pt's daughter are concerned regarding taking both together.  ? ? ?Please review and advise ? ? ?Pt checks sugar bid. Was using accu-check meter ?

## 2021-10-22 MED ORDER — BLOOD GLUCOSE METER KIT: PACK | 0 refills | Status: DC

## 2021-10-22 NOTE — Telephone Encounter (Signed)
Pt had made appt to further discuss. Rx sent for glucometer. ?

## 2021-10-22 NOTE — Telephone Encounter (Signed)
Encourage patient to make appointment to discuss these issues--- virtual is fine. ?

## 2021-10-23 ENCOUNTER — Other Ambulatory Visit: Payer: Self-pay

## 2021-10-23 ENCOUNTER — Encounter: Payer: Self-pay | Admitting: Family Medicine

## 2021-10-23 ENCOUNTER — Ambulatory Visit (INDEPENDENT_AMBULATORY_CARE_PROVIDER_SITE_OTHER): Payer: Medicare HMO | Admitting: Family Medicine

## 2021-10-23 VITALS — BP 104/66 | HR 104 | Temp 97.7°F | Ht 67.5 in | Wt 223.2 lb

## 2021-10-23 DIAGNOSIS — E892 Postprocedural hypoparathyroidism: Secondary | ICD-10-CM | POA: Diagnosis not present

## 2021-10-23 DIAGNOSIS — E890 Postprocedural hypothyroidism: Secondary | ICD-10-CM

## 2021-10-23 LAB — CALCIUM: Calcium: 9.4 mg/dL (ref 8.4–10.5)

## 2021-10-23 LAB — TSH: TSH: 17.25 u[IU]/mL — ABNORMAL HIGH (ref 0.35–5.50)

## 2021-10-23 LAB — ALBUMIN: Albumin: 4.1 g/dL (ref 3.5–5.2)

## 2021-10-23 MED ORDER — HYDROCODONE-ACETAMINOPHEN 5-325 MG PO TABS: ORAL_TABLET | ORAL | 0 refills | Status: DC
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
## 2021-10-23 NOTE — Patient Instructions (Signed)
Claudia Genta, MD ?[facility] ?[address] ?[place], [place] [address] ?[phone] ?Please contact to schedule your next colonoscopy ? ?

## 2021-10-24 ENCOUNTER — Other Ambulatory Visit: Payer: Self-pay

## 2021-10-24 ENCOUNTER — Encounter: Payer: Self-pay | Admitting: Family Medicine

## 2021-10-24 DIAGNOSIS — E119 Type 2 diabetes mellitus without complications: Secondary | ICD-10-CM

## 2021-10-24 MED ORDER — ACCU-CHEK GUIDE VI STRP: ORAL_STRIP | 5 refills | Status: AC

## 2021-10-24 MED ORDER — ACCU-CHEK SOFTCLIX LANCETS MISC: 5 refills | Status: DC

## 2021-10-24 MED ORDER — LEVOTHYROXINE SODIUM 88 MCG PO TABS: ORAL_TABLET | ORAL | 2 refills | Status: DC

## 2021-10-24 NOTE — Addendum Note (Signed)
Addended by: Tammi Sou on: 10/24/2021 11:40 AM ? ? Modules accepted: Orders ? ?

## 2021-10-31 DIAGNOSIS — E890 Postprocedural hypothyroidism: Secondary | ICD-10-CM | POA: Diagnosis not present

## 2021-10-31 DIAGNOSIS — C73 Malignant neoplasm of thyroid gland: Secondary | ICD-10-CM | POA: Diagnosis not present

## 2021-11-09 DIAGNOSIS — E890 Postprocedural hypothyroidism: Secondary | ICD-10-CM | POA: Diagnosis not present

## 2021-11-09 DIAGNOSIS — C73 Malignant neoplasm of thyroid gland: Secondary | ICD-10-CM | POA: Diagnosis not present

## 2021-11-13 DIAGNOSIS — E890 Postprocedural hypothyroidism: Secondary | ICD-10-CM | POA: Diagnosis not present

## 2021-11-13 DIAGNOSIS — C73 Malignant neoplasm of thyroid gland: Secondary | ICD-10-CM | POA: Diagnosis not present

## 2021-11-13 DIAGNOSIS — H8110 Benign paroxysmal vertigo, unspecified ear: Secondary | ICD-10-CM | POA: Diagnosis not present

## 2021-11-15 DIAGNOSIS — I10 Essential (primary) hypertension: Secondary | ICD-10-CM | POA: Diagnosis not present

## 2021-11-15 DIAGNOSIS — E119 Type 2 diabetes mellitus without complications: Secondary | ICD-10-CM | POA: Diagnosis not present

## 2021-11-15 DIAGNOSIS — H5200 Hypermetropia, unspecified eye: Secondary | ICD-10-CM | POA: Diagnosis not present

## 2021-11-15 LAB — HM DIABETES EYE EXAM

## 2021-12-03 ENCOUNTER — Other Ambulatory Visit: Payer: Self-pay | Admitting: Family Medicine

## 2021-12-04 NOTE — Telephone Encounter (Signed)
Requesting: [employer]co ?Contract: 12/04/20 ?UDS: 12/04/20 ?Last Visit: 10/23/21 ?Next Visit: 12/07/21 ?Last Refill: 10/23/21(90,0) ? ?Please Advise. Medication pending ?

## 2021-12-04 NOTE — Telephone Encounter (Signed)
Pt advised refill sent. °

## 2021-12-07 ENCOUNTER — Ambulatory Visit (INDEPENDENT_AMBULATORY_CARE_PROVIDER_SITE_OTHER): Payer: Medicare HMO | Admitting: Family Medicine

## 2021-12-07 ENCOUNTER — Encounter: Payer: Self-pay | Admitting: Family Medicine

## 2021-12-07 VITALS — BP 94/61 | HR 69 | Temp 97.8°F | Ht 67.5 in | Wt 222.2 lb

## 2021-12-07 DIAGNOSIS — M545 Low back pain, unspecified: Secondary | ICD-10-CM

## 2021-12-07 DIAGNOSIS — E890 Postprocedural hypothyroidism: Secondary | ICD-10-CM | POA: Diagnosis not present

## 2021-12-07 DIAGNOSIS — G8929 Other chronic pain: Secondary | ICD-10-CM

## 2021-12-07 DIAGNOSIS — G894 Chronic pain syndrome: Secondary | ICD-10-CM | POA: Diagnosis not present

## 2021-12-07 DIAGNOSIS — Z79899 Other long term (current) drug therapy: Secondary | ICD-10-CM | POA: Diagnosis not present

## 2021-12-07 DIAGNOSIS — R739 Hyperglycemia, unspecified: Secondary | ICD-10-CM

## 2021-12-07 DIAGNOSIS — M25511 Pain in right shoulder: Secondary | ICD-10-CM

## 2021-12-07 DIAGNOSIS — E559 Vitamin D deficiency, unspecified: Secondary | ICD-10-CM | POA: Diagnosis not present

## 2021-12-07 DIAGNOSIS — E7800 Pure hypercholesterolemia, unspecified: Secondary | ICD-10-CM

## 2021-12-07 DIAGNOSIS — M79671 Pain in right foot: Secondary | ICD-10-CM

## 2021-12-07 DIAGNOSIS — I10 Essential (primary) hypertension: Secondary | ICD-10-CM | POA: Diagnosis not present

## 2021-12-07 DIAGNOSIS — M25561 Pain in right knee: Secondary | ICD-10-CM

## 2021-12-07 DIAGNOSIS — E119 Type 2 diabetes mellitus without complications: Secondary | ICD-10-CM | POA: Diagnosis not present

## 2021-12-07 LAB — HEMOGLOBIN A1C: Hgb A1c MFr Bld: 9.5 % — ABNORMAL HIGH (ref 4.6–6.5)

## 2021-12-07 LAB — LIPID PANEL
Cholesterol: 140 mg/dL (ref 0–200)
HDL: 64.2 mg/dL (ref >39.00)
LDL Cholesterol: 55 mg/dL (ref 0–99)
NonHDL: 75.92
Total CHOL/HDL Ratio: 2
Triglycerides: 107 mg/dL (ref 0.0–149.0)
VLDL: 21.4 mg/dL (ref 0.0–40.0)

## 2021-12-07 LAB — POCT GLYCOSYLATED HEMOGLOBIN (HGB A1C)
HbA1c POC (<> result, manual entry): 9.6 % (ref 4.0–5.6)
HbA1c, POC (controlled diabetic range): 9.6 % — AB (ref 0.0–7.0)
HbA1c, POC (prediabetic range): 9.6 % — AB (ref 5.7–6.4)
Hemoglobin A1C: 9.6 % — AB (ref 4.0–5.6)

## 2021-12-07 LAB — COMPREHENSIVE METABOLIC PANEL
ALT: 13 U/L (ref 0–35)
AST: 14 U/L (ref 0–37)
Albumin: 4 g/dL (ref 3.5–5.2)
Alkaline Phosphatase: 185 U/L — ABNORMAL HIGH (ref 39–117)
BUN: 15 mg/dL (ref 6–23)
CO2: 27 mEq/L (ref 19–32)
Calcium: 9.2 mg/dL (ref 8.4–10.5)
Chloride: 100 mEq/L (ref 96–112)
Creatinine, Ser: 0.86 mg/dL (ref 0.40–1.20)
GFR: 70.36 mL/min (ref >60.00)
Glucose, Bld: 193 mg/dL — ABNORMAL HIGH (ref 70–99)
Potassium: 4.7 mEq/L (ref 3.5–5.1)
Sodium: 136 mEq/L (ref 135–145)
Total Bilirubin: 0.8 mg/dL (ref 0.2–1.2)
Total Protein: 7 g/dL (ref 6.0–8.3)

## 2021-12-07 LAB — TSH: TSH: 8.94 u[IU]/mL — ABNORMAL HIGH (ref 0.35–5.50)

## 2021-12-07 LAB — VITAMIN D 25 HYDROXY (VIT D DEFICIENCY, FRACTURES): VITD: 37.56 ng/mL (ref 30.00–100.00)
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
## 2021-12-08 LAB — INSULIN AND C-PEPTIDE, SERUM
C-Peptide: 4.9 ng/mL — ABNORMAL HIGH (ref 1.1–4.4)
INSULIN: 11.5 u[IU]/mL (ref 2.6–24.9)

## 2021-12-10 LAB — DRUG MONITORING PANEL 376104, URINE
Amphetamines: NEGATIVE ng/mL (ref <500)
Barbiturates: NEGATIVE ng/mL (ref <300)
Benzodiazepines: NEGATIVE ng/mL (ref <100)
Cocaine Metabolite: NEGATIVE ng/mL (ref <150)
Codeine: NEGATIVE ng/mL (ref <50)
Desmethyltramadol: NEGATIVE ng/mL (ref <100)
Hydrocodone: 429 ng/mL — ABNORMAL HIGH (ref <50)
Hydromorphone: NEGATIVE ng/mL (ref <50)
Morphine: NEGATIVE ng/mL (ref <50)
Norhydrocodone: 713 ng/mL — ABNORMAL HIGH (ref <50)
Opiates: POSITIVE ng/mL — AB (ref <100)
Oxycodone: NEGATIVE ng/mL (ref <100)
Tramadol: NEGATIVE ng/mL (ref <100)

## 2021-12-10 LAB — DM TEMPLATE

## 2021-12-10 LAB — GLUTAMIC ACID DECARBOXYLASE AUTO ABS: Glutamic Acid Decarb Ab: <5 IU/mL (ref <5)

## 2021-12-11 ENCOUNTER — Telehealth: Payer: Self-pay

## 2021-12-11 DIAGNOSIS — E890 Postprocedural hypothyroidism: Secondary | ICD-10-CM

## 2021-12-11 MED ORDER — LEVOTHYROXINE SODIUM 88 MCG PO TABS: ORAL_TABLET | ORAL | 2 refills | Status: DC

## 2021-12-11 NOTE — Telephone Encounter (Signed)
-----   Message from Tammi Sou, MD sent at 12/11/2021  9:47 AM EDT ----- ?Blood work today confirmed hemoglobin A1c 9.5%. ?Thyroid hormones still little bit low. ?Take 1-1/2 of the 88 mcg levothyroxine tabs Monday through Friday, take only 1 tab on Saturdays and Sundays.  Nonfasting TSH in 6 weeks, diagnosis postsurgical hypothyroidism. ?All other labs normal. ?
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
## 2021-12-18 ENCOUNTER — Ambulatory Visit: Payer: Medicare HMO | Admitting: Physical Therapy
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
## 2021-12-26 ENCOUNTER — Other Ambulatory Visit: Payer: Self-pay | Admitting: Family Medicine

## 2021-12-26 DIAGNOSIS — E890 Postprocedural hypothyroidism: Secondary | ICD-10-CM | POA: Diagnosis not present

## 2021-12-26 NOTE — Telephone Encounter (Signed)
Requesting: [facility] ?Contract: 12/06/21 ?UDS: 12/07/21 ?Last Visit: 12/07/21 ?Next Visit: 12/31/21 ?Last Refill: 12/04/21(90,0) ? ?Please Advise. Med pending ? ?

## 2021-12-27 ENCOUNTER — Telehealth: Payer: Self-pay | Admitting: Physical Therapy

## 2021-12-27 ENCOUNTER — Encounter: Payer: Self-pay | Admitting: Physical Therapy

## 2021-12-27 ENCOUNTER — Ambulatory Visit: Payer: Medicare HMO | Attending: Internal Medicine | Admitting: Physical Therapy

## 2021-12-27 DIAGNOSIS — H8112 Benign paroxysmal vertigo, left ear: Secondary | ICD-10-CM | POA: Diagnosis not present

## 2021-12-27 DIAGNOSIS — R2681 Unsteadiness on feet: Secondary | ICD-10-CM | POA: Diagnosis not present

## 2021-12-27 DIAGNOSIS — R42 Dizziness and giddiness: Secondary | ICD-10-CM | POA: Diagnosis not present

## 2021-12-27 NOTE — Addendum Note (Signed)
Addended by: Janene Harvey D on: 12/27/2021 02:02 PM   Modules accepted: Orders

## 2021-12-27 NOTE — Telephone Encounter (Signed)
Hi Dr. Buddy Duty,  I evaluated Ms. Oriley in [place] for dizziness today per your referral. She presented with some abnormal oculomotor testing including: saccades with vertical smooth pursuits, corrective saccades with saccadic testing to L and inferior directions, and poor fixation with VOR cancellation. She does also demonstrate symptoms of peripheral vertigo which we are happy to address for her. However, believe she may benefit from further CNS workup or referral to Neuro. Please advise.  Thanks!  Janene Harvey, PT, DPT

## 2021-12-31 ENCOUNTER — Ambulatory Visit (INDEPENDENT_AMBULATORY_CARE_PROVIDER_SITE_OTHER): Payer: Medicare HMO | Admitting: Family Medicine

## 2021-12-31 ENCOUNTER — Encounter: Payer: Self-pay | Admitting: Family Medicine

## 2021-12-31 VITALS — BP 103/66 | HR 60 | Temp 97.7°F | Ht 67.5 in | Wt 228.4 lb

## 2021-12-31 DIAGNOSIS — E119 Type 2 diabetes mellitus without complications: Secondary | ICD-10-CM | POA: Diagnosis not present

## 2021-12-31 DIAGNOSIS — I952 Hypotension due to drugs: Secondary | ICD-10-CM | POA: Diagnosis not present

## 2021-12-31 DIAGNOSIS — E890 Postprocedural hypothyroidism: Secondary | ICD-10-CM

## 2021-12-31 MED ORDER — METHOCARBAMOL 500 MG PO TABS: ORAL_TABLET | ORAL | 3 refills | Status: DC

## 2021-12-31 MED ORDER — LISINOPRIL 2.5 MG PO TABS: 2.5000 mg | ORAL_TABLET | Freq: Every day | ORAL | 6 refills | Status: DC

## 2021-12-31 MED ORDER — TRULICITY 0.75 MG/0.5ML ~~LOC~~ SOAJ: 0.7500 mg | SUBCUTANEOUS | 0 refills | Status: DC

## 2021-12-31 MED ORDER — HYDROCODONE-ACETAMINOPHEN 5-325 MG PO TABS: ORAL_TABLET | ORAL | 0 refills | Status: DC
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
## 2022-01-01 DIAGNOSIS — Z0100 Encounter for examination of eyes and vision without abnormal findings: Secondary | ICD-10-CM | POA: Diagnosis not present
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
## 2022-01-02 ENCOUNTER — Ambulatory Visit: Payer: Medicare HMO | Admitting: Physical Therapy

## 2022-01-02 ENCOUNTER — Encounter: Payer: Self-pay | Admitting: Physical Therapy

## 2022-01-02 DIAGNOSIS — H8112 Benign paroxysmal vertigo, left ear: Secondary | ICD-10-CM | POA: Diagnosis not present

## 2022-01-02 DIAGNOSIS — R2681 Unsteadiness on feet: Secondary | ICD-10-CM | POA: Diagnosis not present

## 2022-01-02 DIAGNOSIS — R42 Dizziness and giddiness: Secondary | ICD-10-CM | POA: Diagnosis not present

## 2022-01-09 ENCOUNTER — Ambulatory Visit: Payer: Medicare HMO | Admitting: Physical Therapy

## 2022-01-11 ENCOUNTER — Other Ambulatory Visit: Payer: Self-pay

## 2022-01-16 ENCOUNTER — Encounter: Payer: Medicare HMO | Admitting: Physical Therapy
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
## 2022-01-21 ENCOUNTER — Telehealth: Payer: Self-pay

## 2022-01-21 ENCOUNTER — Encounter: Payer: Self-pay | Admitting: Neurology

## 2022-01-21 NOTE — Telephone Encounter (Signed)
Patient states she has an upcoming appt with Dr. Anitra Lauth on 6/14 for follow up on medication.  Patient states she has been round and round with her insurance company to get approved for medication.  Trulicity The last letter she rec'd from them was on this appt Friday, and they have denied the medication again.  Patient is wanting to know next steps.  Does she need to try another medication? Does she keep appt for Wednesday?  Appt on Wednesday was to follow up on medication and to get labs based on how she was doing on Truliicty  Please advise [phone]

## 2022-01-21 NOTE — Telephone Encounter (Signed)
Please review and advise.

## 2022-01-22 ENCOUNTER — Encounter: Payer: Self-pay | Admitting: Physical Therapy

## 2022-01-22 ENCOUNTER — Ambulatory Visit: Payer: Medicare HMO | Attending: Internal Medicine | Admitting: Physical Therapy

## 2022-01-22 DIAGNOSIS — R2681 Unsteadiness on feet: Secondary | ICD-10-CM | POA: Diagnosis not present

## 2022-01-22 DIAGNOSIS — R42 Dizziness and giddiness: Secondary | ICD-10-CM | POA: Insufficient documentation

## 2022-01-22 DIAGNOSIS — H8112 Benign paroxysmal vertigo, left ear: Secondary | ICD-10-CM | POA: Diagnosis not present

## 2022-01-23 ENCOUNTER — Encounter: Payer: Self-pay | Admitting: Family Medicine

## 2022-01-23 ENCOUNTER — Ambulatory Visit (INDEPENDENT_AMBULATORY_CARE_PROVIDER_SITE_OTHER): Payer: Medicare HMO | Admitting: Family Medicine

## 2022-01-23 ENCOUNTER — Ambulatory Visit: Payer: Medicare HMO

## 2022-01-23 VITALS — BP 103/65 | HR 53 | Temp 97.7°F | Ht 67.5 in | Wt 229.6 lb

## 2022-01-23 DIAGNOSIS — E890 Postprocedural hypothyroidism: Secondary | ICD-10-CM | POA: Diagnosis not present

## 2022-01-23 DIAGNOSIS — E119 Type 2 diabetes mellitus without complications: Secondary | ICD-10-CM | POA: Diagnosis not present

## 2022-01-23 DIAGNOSIS — I952 Hypotension due to drugs: Secondary | ICD-10-CM | POA: Diagnosis not present

## 2022-01-23 LAB — TSH: TSH: 1.17 u[IU]/mL (ref 0.35–5.50)

## 2022-01-23 MED ORDER — MECLIZINE HCL 12.5 MG PO TABS: 12.5000 mg | ORAL_TABLET | Freq: Three times a day (TID) | ORAL | 1 refills | Status: DC | PRN

## 2022-01-23 MED ORDER — ONDANSETRON HCL 4 MG PO TABS: 4.0000 mg | ORAL_TABLET | Freq: Three times a day (TID) | ORAL | 1 refills | Status: DC | PRN

## 2022-01-23 MED ORDER — SEMAGLUTIDE(0.25 OR 0.5MG/DOS) 2 MG/3ML ~~LOC~~ SOPN: PEN_INJECTOR | SUBCUTANEOUS | 6 refills | Status: DC

## 2022-01-23 NOTE — Patient Instructions (Signed)
Here are some other options to check with your insurer about if ozempic doesn't work out.
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
## 2022-01-28 ENCOUNTER — Other Ambulatory Visit: Payer: Self-pay | Admitting: Family Medicine

## 2022-01-28 NOTE — Telephone Encounter (Signed)
Requesting: [employer] Contract: 12/07/21 UDS: 12/07/21 Last Visit: 01/23/22 Next Visit: 03/18/22 Last Refill: 12/31/21(90,0)  Please Advise. Medication pending

## 2022-01-30 ENCOUNTER — Ambulatory Visit: Payer: Medicare HMO | Admitting: Physical Therapy

## 2022-02-04 ENCOUNTER — Other Ambulatory Visit: Payer: Self-pay | Admitting: Family Medicine
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
## 2022-02-05 ENCOUNTER — Ambulatory Visit: Payer: Medicare HMO | Admitting: Physical Therapy

## 2022-02-05 ENCOUNTER — Encounter: Payer: Self-pay | Admitting: Physical Therapy

## 2022-02-05 DIAGNOSIS — R2681 Unsteadiness on feet: Secondary | ICD-10-CM

## 2022-02-05 DIAGNOSIS — H8112 Benign paroxysmal vertigo, left ear: Secondary | ICD-10-CM

## 2022-02-05 DIAGNOSIS — R42 Dizziness and giddiness: Secondary | ICD-10-CM | POA: Diagnosis not present

## 2022-02-25 ENCOUNTER — Other Ambulatory Visit: Payer: Self-pay | Admitting: Family Medicine

## 2022-02-26 ENCOUNTER — Ambulatory Visit: Payer: Medicare HMO | Admitting: Physical Therapy

## 2022-02-26 ENCOUNTER — Other Ambulatory Visit: Payer: Self-pay | Admitting: Family Medicine

## 2022-02-26 NOTE — Telephone Encounter (Signed)
Requesting:hydrocodone 5 mg-acetaminophen 325 mg tablet Contract:12/06/21 UDS:12/07/21 Last Visit:01/23/22 Next Visit:03/18/22 Last Refill:01/29/22 (90,0)  Please Advise

## 2022-02-28 ENCOUNTER — Other Ambulatory Visit: Payer: Self-pay | Admitting: Family Medicine
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
## 2022-03-05 ENCOUNTER — Ambulatory Visit: Payer: Medicare HMO | Attending: Internal Medicine | Admitting: Physical Therapy

## 2022-03-05 ENCOUNTER — Encounter: Payer: Self-pay | Admitting: Physical Therapy

## 2022-03-05 DIAGNOSIS — R42 Dizziness and giddiness: Secondary | ICD-10-CM | POA: Insufficient documentation

## 2022-03-05 DIAGNOSIS — H8112 Benign paroxysmal vertigo, left ear: Secondary | ICD-10-CM | POA: Diagnosis not present

## 2022-03-05 DIAGNOSIS — R2681 Unsteadiness on feet: Secondary | ICD-10-CM | POA: Insufficient documentation
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
## 2022-03-06 ENCOUNTER — Encounter: Payer: Self-pay | Admitting: Neurology

## 2022-03-06 ENCOUNTER — Ambulatory Visit: Payer: Medicare HMO | Admitting: Neurology

## 2022-03-06 VITALS — BP 124/75 | HR 74 | Ht 67.0 in | Wt 238.0 lb

## 2022-03-06 DIAGNOSIS — R42 Dizziness and giddiness: Secondary | ICD-10-CM

## 2022-03-06 DIAGNOSIS — I639 Cerebral infarction, unspecified: Secondary | ICD-10-CM

## 2022-03-06 NOTE — Patient Instructions (Signed)
Crystals in the ear is definitely a cause.  The question is if there is something else involving the brain that may be a cause for dizziness. We will check MRI brain with and without contrast and MRA head and neck Further recommendations pending results.

## 2022-03-17 ENCOUNTER — Ambulatory Visit
Admission: RE | Admit: 2022-03-17 | Discharge: 2022-03-17 | Disposition: A | Discharge status: Home / Self Care | Payer: Medicare HMO | Source: Ambulatory Visit | Attending: Neurology | Admitting: Neurology

## 2022-03-17 DIAGNOSIS — I639 Cerebral infarction, unspecified: Secondary | ICD-10-CM

## 2022-03-18 ENCOUNTER — Encounter: Payer: Self-pay | Admitting: Family Medicine

## 2022-03-18 ENCOUNTER — Ambulatory Visit (INDEPENDENT_AMBULATORY_CARE_PROVIDER_SITE_OTHER): Payer: Medicare HMO | Admitting: Family Medicine

## 2022-03-18 VITALS — BP 104/67 | HR 69 | Temp 97.9°F | Ht 67.0 in | Wt 238.2 lb

## 2022-03-18 DIAGNOSIS — G8929 Other chronic pain: Secondary | ICD-10-CM

## 2022-03-18 DIAGNOSIS — Z79899 Other long term (current) drug therapy: Secondary | ICD-10-CM | POA: Diagnosis not present

## 2022-03-18 DIAGNOSIS — E119 Type 2 diabetes mellitus without complications: Secondary | ICD-10-CM

## 2022-03-18 DIAGNOSIS — G894 Chronic pain syndrome: Secondary | ICD-10-CM

## 2022-03-18 DIAGNOSIS — M79671 Pain in right foot: Secondary | ICD-10-CM

## 2022-03-18 DIAGNOSIS — M545 Low back pain, unspecified: Secondary | ICD-10-CM

## 2022-03-18 DIAGNOSIS — I10 Essential (primary) hypertension: Secondary | ICD-10-CM

## 2022-03-18 LAB — POCT GLYCOSYLATED HEMOGLOBIN (HGB A1C)
HbA1c POC (<> result, manual entry): 7.5 % (ref 4.0–5.6)
HbA1c, POC (controlled diabetic range): 7.5 % — AB (ref 0.0–7.0)
HbA1c, POC (prediabetic range): 7.5 % — AB (ref 5.7–6.4)
Hemoglobin A1C: 7.5 % — AB (ref 4.0–5.6)

## 2022-03-18 MED ORDER — TRAMADOL HCL 50 MG PO TABS: ORAL_TABLET | ORAL | 5 refills | Status: DC

## 2022-03-18 MED ORDER — SEMAGLUTIDE (1 MG/DOSE) 4 MG/3ML ~~LOC~~ SOPN: 1.0000 mg | PEN_INJECTOR | SUBCUTANEOUS | 1 refills | Status: DC

## 2022-03-18 MED ORDER — ONDANSETRON HCL 4 MG PO TABS: 4.0000 mg | ORAL_TABLET | Freq: Three times a day (TID) | ORAL | 1 refills | Status: DC | PRN

## 2022-03-18 MED ORDER — HYDROCODONE-ACETAMINOPHEN 5-325 MG PO TABS: ORAL_TABLET | ORAL | 0 refills | Status: DC
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
## 2022-03-19 LAB — COMPREHENSIVE METABOLIC PANEL
ALT: 15 U/L (ref 0–35)
AST: 15 U/L (ref 0–37)
Albumin: 4.1 g/dL (ref 3.5–5.2)
Alkaline Phosphatase: 158 U/L — ABNORMAL HIGH (ref 39–117)
BUN: 14 mg/dL (ref 6–23)
CO2: 27 mEq/L (ref 19–32)
Calcium: 9.2 mg/dL (ref 8.4–10.5)
Chloride: 104 mEq/L (ref 96–112)
Creatinine, Ser: 0.9 mg/dL (ref 0.40–1.20)
GFR: 66.5 mL/min (ref >60.00)
Glucose, Bld: 189 mg/dL — ABNORMAL HIGH (ref 70–99)
Potassium: 5 mEq/L (ref 3.5–5.1)
Sodium: 141 mEq/L (ref 135–145)
Total Bilirubin: 0.8 mg/dL (ref 0.2–1.2)
Total Protein: 7.1 g/dL (ref 6.0–8.3)

## 2022-03-20 ENCOUNTER — Other Ambulatory Visit: Payer: Self-pay | Admitting: Family Medicine

## 2022-04-03 ENCOUNTER — Other Ambulatory Visit: Payer: Self-pay | Admitting: Family Medicine

## 2022-04-03 ENCOUNTER — Telehealth: Payer: Self-pay | Admitting: Neurology

## 2022-04-03 NOTE — Telephone Encounter (Signed)
Will request to have Tens unit records faxed over from [place].

## 2022-04-03 NOTE — Telephone Encounter (Signed)
Patient needs to talk to someone about the MRI that Dr Tomi Likens wanted her to have. They will not do the MRI due to her having a tens unit in her body    Please call

## 2022-04-25 ENCOUNTER — Other Ambulatory Visit: Payer: Self-pay | Admitting: Family Medicine

## 2022-04-25 NOTE — Telephone Encounter (Signed)
Pt advised refill sent. °

## 2022-04-25 NOTE — Telephone Encounter (Signed)
Requesting: [employer] Contract: 12/06/21 UDS: 12/07/21 Last Visit: 03/18/22 Next Visit: 06/18/22 Last Refill: 03/18/22(90,0)  Please Advise. Med pending

## 2022-05-01 ENCOUNTER — Telehealth: Payer: Self-pay

## 2022-05-01 NOTE — Telephone Encounter (Signed)
Patient states she cannot afford Ozempic and Invokana due to cost both medications. The Ozempic is $275 and Invokana $150. Her sugars have been fluctuating since she had thyroid surgery. Her last appointment was 03/18/22 and her next scheduled follow up is 06/18/22.  She wanted to know about possibly getting a sample. The only current doses we have are '2mg'$ /94m and '8mg'$ /386m  Please further advise.

## 2022-05-01 NOTE — Telephone Encounter (Signed)
If she has been tolerating the 1 mg weekly dose of Ozempic well then she can come by and pick up a package of the 2 mg doses. If she is hesitant about going up to the 2 mg dose then she can pick up a package of our 0.5 mg dose. Please ask Adolm Joseph to request some more 1 mg samples from the drug rep.

## 2022-05-01 NOTE — Telephone Encounter (Signed)
Patient would like to speak to Dr. Idelle Leech assistant regarding a medication.  She wants to talk about Ozempic. I asked her to clarify (2 times) and she would not go into detail with me.  Please call [phone]

## 2022-05-02 ENCOUNTER — Encounter: Payer: Self-pay | Admitting: Family Medicine

## 2022-05-02 ENCOUNTER — Ambulatory Visit (INDEPENDENT_AMBULATORY_CARE_PROVIDER_SITE_OTHER): Payer: Medicare HMO

## 2022-05-02 DIAGNOSIS — Z23 Encounter for immunization: Secondary | ICD-10-CM | POA: Diagnosis not present

## 2022-05-02 MED ORDER — SEMAGLUTIDE (2 MG/DOSE) 8 MG/3ML ~~LOC~~ SOPN: 2.0000 mg | PEN_INJECTOR | SUBCUTANEOUS | 0 refills | Status: DC

## 2022-05-02 NOTE — Telephone Encounter (Signed)
Patient advised of recommendations. She tolerated the 0.5 mg so Dr.McGowen increased to 1 mg. She did not take the 1 mg due to cost. Advised we could provide a sample of the 0.5 mg dose or 2 mg dose for her to try. She will come by today to get a sample of the 2 mg. She is aware of lunch hour and is coming after 2.

## 2022-05-07 ENCOUNTER — Encounter: Payer: Self-pay | Admitting: Family Medicine

## 2022-05-08 ENCOUNTER — Encounter: Payer: Self-pay | Admitting: *Deleted

## 2022-05-08 ENCOUNTER — Other Ambulatory Visit: Payer: Self-pay

## 2022-05-08 DIAGNOSIS — Z1211 Encounter for screening for malignant neoplasm of colon: Secondary | ICD-10-CM

## 2022-05-08 NOTE — Telephone Encounter (Signed)
Do not take the 1 mg dose anymore.  I have set aside a sample of the 0.5 mg dose for her to pick up (labeled in refrigerator).

## 2022-05-10 ENCOUNTER — Other Ambulatory Visit: Payer: Self-pay

## 2022-05-10 MED ORDER — OZEMPIC (0.25 OR 0.5 MG/DOSE) 2 MG/3ML ~~LOC~~ SOPN: 0.5000 mg | PEN_INJECTOR | SUBCUTANEOUS | 0 refills | Status: DC

## 2022-05-10 NOTE — Telephone Encounter (Signed)
Pt has picked up sample and documented in chart.

## 2022-05-20 DIAGNOSIS — E890 Postprocedural hypothyroidism: Secondary | ICD-10-CM | POA: Diagnosis not present

## 2022-05-20 DIAGNOSIS — Z85850 Personal history of malignant neoplasm of thyroid: Secondary | ICD-10-CM | POA: Diagnosis not present

## 2022-05-20 LAB — TSH: TSH: 10.27 — AB (ref 0.41–5.90)

## 2022-05-22 ENCOUNTER — Other Ambulatory Visit: Payer: Self-pay | Admitting: Family Medicine

## 2022-05-22 DIAGNOSIS — Z1231 Encounter for screening mammogram for malignant neoplasm of breast: Secondary | ICD-10-CM

## 2022-05-27 ENCOUNTER — Other Ambulatory Visit: Payer: Self-pay

## 2022-05-27 MED ORDER — HYDROCODONE-ACETAMINOPHEN 5-325 MG PO TABS: ORAL_TABLET | ORAL | 0 refills | Status: DC

## 2022-05-27 NOTE — Telephone Encounter (Signed)
Requesting: Hydrocodone Contract: 12/06/21 UDS: 12/07/21 Last Visit: 03/18/22 Next Visit: 06/18/22 Last Refill: 04/25/22 (90,0)  Please Advise. Med pending

## 2022-05-27 NOTE — Telephone Encounter (Signed)
Patient refill request.  [facility]  HYDROcodone-acetaminophen (NORCO/VICODIN) 5-325 MG tablet [[id number]]

## 2022-05-28 NOTE — Telephone Encounter (Signed)
Pt advised refill sent. °

## 2022-05-29 ENCOUNTER — Ambulatory Visit (INDEPENDENT_AMBULATORY_CARE_PROVIDER_SITE_OTHER): Payer: Medicare HMO

## 2022-05-29 VITALS — Wt 238.0 lb

## 2022-05-29 DIAGNOSIS — Z0000 Encounter for general adult medical examination without abnormal findings: Secondary | ICD-10-CM | POA: Diagnosis not present

## 2022-05-29 DIAGNOSIS — M810 Age-related osteoporosis without current pathological fracture: Secondary | ICD-10-CM | POA: Diagnosis not present

## 2022-05-29 NOTE — Patient Instructions (Signed)
Claudia [name] , Thank you for taking time to come for your Medicare Wellness Visit. I appreciate your ongoing commitment to your health goals. Please review the following plan we discussed and let me know if I can assist you in the future.   These are the goals we discussed:  Goals      Increase physical activity     Patient Stated     Lose weight         This is a list of the screening recommended for you and due dates:  Health Maintenance  Topic Date Due   Colon Cancer Screening  08/12/2018   DEXA scan (bone density measurement)  Never done   COVID-19 Vaccine (5 - Moderna risk series) 02/01/2021   Yearly kidney health urinalysis for diabetes  06/05/2022   Complete foot exam   09/10/2022   Hemoglobin A1C  09/18/2022   Eye exam for diabetics  11/16/2022   Yearly kidney function blood test for diabetes  03/19/2023   Mammogram  06/19/2023   Tetanus Vaccine  10/04/2030   Pneumonia Vaccine  Completed   Flu Shot  Completed   Zoster (Shingles) Vaccine  Completed   HPV Vaccine  Aged Out   Hepatitis C Screening: USPSTF Recommendation to screen - Ages 55-79 yo.  Discontinued    Advanced directives: Please bring a copy of your health care power of attorney and living will to the office at your convenience.  Conditions/risks identified: lose weight   Next appointment: Follow up in one year for your annual wellness visit    Preventive Care 65 Years and Older, Female Preventive care refers to lifestyle choices and visits with your health care provider that can promote health and wellness. What does preventive care include? A yearly physical exam. This is also called an annual well check. Dental exams once or twice a year. Routine eye exams. Ask your health care provider how often you should have your eyes checked. Personal lifestyle choices, including: Daily care of your teeth and gums. Regular physical activity. Eating a healthy diet. Avoiding tobacco and drug use. Limiting  alcohol use. Practicing safe sex. Taking low-dose aspirin every day. Taking vitamin and mineral supplements as recommended by your health care provider. What happens during an annual well check? The services and screenings done by your health care provider during your annual well check will depend on your age, overall health, lifestyle risk factors, and family history of disease. Counseling  Your health care provider may ask you questions about your: Alcohol use. Tobacco use. Drug use. Emotional well-being. Home and relationship well-being. Sexual activity. Eating habits. History of falls. Memory and ability to understand (cognition). Work and work Statistician. Reproductive health. Screening  You may have the following tests or measurements: Height, weight, and BMI. Blood pressure. Lipid and cholesterol levels. These may be checked every 5 years, or more frequently if you are over 29 years old. Skin check. Lung cancer screening. You may have this screening every year starting at age 60 if you have a 30-pack-year history of smoking and currently smoke or have quit within the past 15 years. Fecal occult blood test (FOBT) of the stool. You may have this test every year starting at age 98. Flexible sigmoidoscopy or colonoscopy. You may have a sigmoidoscopy every 5 years or a colonoscopy every 10 years starting at age 50. Hepatitis C blood test. Hepatitis B blood test. Sexually transmitted disease (STD) testing. Diabetes screening. This is done by checking your blood sugar (glucose)  after you have not eaten for a while (fasting). You may have this done every 1-3 years. Bone density scan. This is done to screen for osteoporosis. You may have this done starting at age 32. Mammogram. This may be done every 1-2 years. Talk to your health care provider about how often you should have regular mammograms. Talk with your health care provider about your test results, treatment options, and if  necessary, the need for more tests. Vaccines  Your health care provider may recommend certain vaccines, such as: Influenza vaccine. This is recommended every year. Tetanus, diphtheria, and acellular pertussis (Tdap, Td) vaccine. You may need a Td booster every 10 years. Zoster vaccine. You may need this after age 51. Pneumococcal 13-valent conjugate (PCV13) vaccine. One dose is recommended after age 36. Pneumococcal polysaccharide (PPSV23) vaccine. One dose is recommended after age 71. Talk to your health care provider about which screenings and vaccines you need and how often you need them. This information is not intended to replace advice given to you by your health care provider. Make sure you discuss any questions you have with your health care provider. Document Released: 08/25/2015 Document Revised: 04/17/2016 Document Reviewed: 05/30/2015 Elsevier Interactive Patient Education  2017 [place] Prevention in the Home Falls can cause injuries. They can happen to people of all ages. There are many things you can do to make your home safe and to help prevent falls. What can I do on the outside of my home? Regularly fix the edges of walkways and driveways and fix any cracks. Remove anything that might make you trip as you walk through a door, such as a raised step or threshold. Trim any bushes or trees on the path to your home. Use bright outdoor lighting. Clear any walking paths of anything that might make someone trip, such as rocks or tools. Regularly check to see if handrails are loose or broken. Make sure that both sides of any steps have handrails. Any raised decks and porches should have guardrails on the edges. Have any leaves, snow, or ice cleared regularly. Use sand or salt on walking paths during winter. Clean up any spills in your garage right away. This includes oil or grease spills. What can I do in the bathroom? Use night lights. Install grab bars by the toilet  and in the tub and shower. Do not use towel bars as grab bars. Use non-skid mats or decals in the tub or shower. If you need to sit down in the shower, use a plastic, non-slip stool. Keep the floor dry. Clean up any water that spills on the floor as soon as it happens. Remove soap buildup in the tub or shower regularly. Attach bath mats securely with double-sided non-slip rug tape. Do not have throw rugs and other things on the floor that can make you trip. What can I do in the bedroom? Use night lights. Make sure that you have a light by your bed that is easy to reach. Do not use any sheets or blankets that are too big for your bed. They should not hang down onto the floor. Have a firm chair that has side arms. You can use this for support while you get dressed. Do not have throw rugs and other things on the floor that can make you trip. What can I do in the kitchen? Clean up any spills right away. Avoid walking on wet floors. Keep items that you use a lot in easy-to-reach places. If  you need to reach something above you, use a strong step stool that has a grab bar. Keep electrical cords out of the way. Do not use floor polish or wax that makes floors slippery. If you must use wax, use non-skid floor wax. Do not have throw rugs and other things on the floor that can make you trip. What can I do with my stairs? Do not leave any items on the stairs. Make sure that there are handrails on both sides of the stairs and use them. Fix handrails that are broken or loose. Make sure that handrails are as long as the stairways. Check any carpeting to make sure that it is firmly attached to the stairs. Fix any carpet that is loose or worn. Avoid having throw rugs at the top or bottom of the stairs. If you do have throw rugs, attach them to the floor with carpet tape. Make sure that you have a light switch at the top of the stairs and the bottom of the stairs. If you do not have them, ask someone to add  them for you. What else can I do to help prevent falls? Wear shoes that: Do not have high heels. Have rubber bottoms. Are comfortable and fit you well. Are closed at the toe. Do not wear sandals. If you use a stepladder: Make sure that it is fully opened. Do not climb a closed stepladder. Make sure that both sides of the stepladder are locked into place. Ask someone to hold it for you, if possible. Clearly mark and make sure that you can see: Any grab bars or handrails. First and last steps. Where the edge of each step is. Use tools that help you move around (mobility aids) if they are needed. These include: Canes. Walkers. Scooters. Crutches. Turn on the lights when you go into a dark area. Replace any light bulbs as soon as they burn out. Set up your furniture so you have a clear path. Avoid moving your furniture around. If any of your floors are uneven, fix them. If there are any pets around you, be aware of where they are. Review your medicines with your doctor. Some medicines can make you feel dizzy. This can increase your chance of falling. Ask your doctor what other things that you can do to help prevent falls. This information is not intended to replace advice given to you by your health care provider. Make sure you discuss any questions you have with your health care provider. Document Released: 05/25/2009 Document Revised: 01/04/2016 Document Reviewed: 09/02/2014 Elsevier Interactive Patient Education  2017 [employer].
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
## 2022-05-31 ENCOUNTER — Other Ambulatory Visit: Payer: Self-pay | Admitting: Family Medicine

## 2022-06-07 ENCOUNTER — Other Ambulatory Visit: Payer: Self-pay | Admitting: Family Medicine

## 2022-06-07 DIAGNOSIS — M810 Age-related osteoporosis without current pathological fracture: Secondary | ICD-10-CM

## 2022-06-11 DIAGNOSIS — M25511 Pain in right shoulder: Secondary | ICD-10-CM | POA: Diagnosis not present

## 2022-06-12 ENCOUNTER — Encounter: Payer: Self-pay | Admitting: Family Medicine

## 2022-06-17 ENCOUNTER — Other Ambulatory Visit: Payer: Self-pay | Admitting: Family Medicine

## 2022-06-18 ENCOUNTER — Telehealth: Payer: Medicare HMO | Admitting: Nurse Practitioner

## 2022-06-18 ENCOUNTER — Ambulatory Visit: Payer: Medicare HMO | Admitting: Family Medicine

## 2022-06-18 DIAGNOSIS — J40 Bronchitis, not specified as acute or chronic: Secondary | ICD-10-CM | POA: Diagnosis not present

## 2022-06-18 MED ORDER — BENZONATATE 100 MG PO CAPS: 100.0000 mg | ORAL_CAPSULE | Freq: Three times a day (TID) | ORAL | 0 refills | Status: DC | PRN

## 2022-06-18 MED ORDER — DOXYCYCLINE HYCLATE 100 MG PO TABS: 100.0000 mg | ORAL_TABLET | Freq: Two times a day (BID) | ORAL | 0 refills | Status: AC

## 2022-06-18 NOTE — Progress Notes (Signed)
We are sorry that you are not feeling well.  Here is how we plan to help!  Based on your presentation I believe you most likely have A cough due to bacteria.  When patients have a fever and a productive cough with a change in color or increased sputum production, we are concerned about bacterial bronchitis.  If left untreated it can progress to pneumonia.  If your symptoms do not improve with your treatment plan it is important that you contact your provider.   I have prescribed Doxycycline 100 mg twice a day for 7 days     In addition you may use A prescription cough medication called Tessalon Perles 100mg. You may take 1-2 capsules every 8 hours as needed for your cough.  From your responses in the eVisit questionnaire you describe inflammation in the upper respiratory tract which is causing a significant cough.  This is commonly called Bronchitis and has four common causes:   Allergies Viral Infections Acid Reflux Bacterial Infection Allergies, viruses and acid reflux are treated by controlling symptoms or eliminating the cause. An example might be a cough caused by taking certain blood pressure medications. You stop the cough by changing the medication. Another example might be a cough caused by acid reflux. Controlling the reflux helps control the cough.  USE OF BRONCHODILATOR ("RESCUE") INHALERS: There is a risk from using your bronchodilator too frequently.  The risk is that over-reliance on a medication which only relaxes the muscles surrounding the breathing tubes can reduce the effectiveness of medications prescribed to reduce swelling and congestion of the tubes themselves.  Although you feel brief relief from the bronchodilator inhaler, your asthma may actually be worsening with the tubes becoming more swollen and filled with mucus.  This can delay other crucial treatments, such as oral steroid medications. If you need to use a bronchodilator inhaler daily, several times per day, you should  discuss this with your provider.  There are probably better treatments that could be used to keep your asthma under control.     HOME CARE Only take medications as instructed by your medical team. Complete the entire course of an antibiotic. Drink plenty of fluids and get plenty of rest. Avoid close contacts especially the very young and the elderly Cover your mouth if you cough or cough into your sleeve. Always remember to wash your hands A steam or ultrasonic humidifier can help congestion.   GET HELP RIGHT AWAY IF: You develop worsening fever. You become short of breath You cough up blood. Your symptoms persist after you have completed your treatment plan MAKE SURE YOU  Understand these instructions. Will watch your condition. Will get help right away if you are not doing well or get worse.    Thank you for choosing an e-visit.  Your e-visit answers were reviewed by a board certified advanced clinical practitioner to complete your personal care plan. Depending upon the condition, your plan could have included both over the counter or prescription medications.  Please review your pharmacy choice. Make sure the pharmacy is open so you can pick up prescription now. If there is a problem, you may contact your provider through MyChart messaging and have the prescription routed to another pharmacy.  Your safety is important to us. If you have drug allergies check your prescription carefully.   For the next 24 hours you can use MyChart to ask questions about today's visit, request a non-urgent call back, or ask for a work or school excuse.   excuse. You will get an email in the next two days asking about your experience. I hope that your e-visit has been valuable and will speed your recovery.   Meds ordered this encounter  Medications   doxycycline (VIBRA-TABS) 100 MG tablet    Sig: Take 1 tablet (100 mg total) by mouth 2 (two) times daily for 7 days.    Dispense:  14 tablet    Refill:   0   benzonatate (TESSALON) 100 MG capsule    Sig: Take 1 capsule (100 mg total) by mouth 3 (three) times daily as needed for cough.    Dispense:  30 capsule    Refill:  0    I spent approximately 5 minutes reviewing the patient's history, current symptoms and coordinating their plan of care today.

## 2022-06-21 ENCOUNTER — Ambulatory Visit (INDEPENDENT_AMBULATORY_CARE_PROVIDER_SITE_OTHER): Payer: Medicare HMO | Admitting: Family Medicine

## 2022-06-21 ENCOUNTER — Encounter: Payer: Self-pay | Admitting: Family Medicine

## 2022-06-21 VITALS — BP 110/70 | HR 64 | Temp 98.0°F | Ht 67.0 in | Wt 240.6 lb

## 2022-06-21 DIAGNOSIS — E119 Type 2 diabetes mellitus without complications: Secondary | ICD-10-CM

## 2022-06-21 DIAGNOSIS — E7800 Pure hypercholesterolemia, unspecified: Secondary | ICD-10-CM | POA: Diagnosis not present

## 2022-06-21 DIAGNOSIS — R0981 Nasal congestion: Secondary | ICD-10-CM | POA: Diagnosis not present

## 2022-06-21 DIAGNOSIS — G894 Chronic pain syndrome: Secondary | ICD-10-CM | POA: Diagnosis not present

## 2022-06-21 DIAGNOSIS — G8929 Other chronic pain: Secondary | ICD-10-CM

## 2022-06-21 DIAGNOSIS — Z79899 Other long term (current) drug therapy: Secondary | ICD-10-CM

## 2022-06-21 DIAGNOSIS — I10 Essential (primary) hypertension: Secondary | ICD-10-CM | POA: Diagnosis not present

## 2022-06-21 DIAGNOSIS — M25561 Pain in right knee: Secondary | ICD-10-CM

## 2022-06-21 DIAGNOSIS — M79671 Pain in right foot: Secondary | ICD-10-CM

## 2022-06-21 DIAGNOSIS — M545 Low back pain, unspecified: Secondary | ICD-10-CM | POA: Diagnosis not present

## 2022-06-21 DIAGNOSIS — R051 Acute cough: Secondary | ICD-10-CM | POA: Diagnosis not present

## 2022-06-21 DIAGNOSIS — M25562 Pain in left knee: Secondary | ICD-10-CM

## 2022-06-21 LAB — POC COVID19 BINAXNOW: SARS Coronavirus 2 Ag: NEGATIVE

## 2022-06-21 LAB — POCT INFLUENZA A/B
Influenza A, POC: NEGATIVE
Influenza B, POC: NEGATIVE

## 2022-06-21 LAB — BASIC METABOLIC PANEL
BUN: 10 mg/dL (ref 6–23)
CO2: 28 mEq/L (ref 19–32)
Calcium: 8.2 mg/dL — ABNORMAL LOW (ref 8.4–10.5)
Chloride: 103 mEq/L (ref 96–112)
Creatinine, Ser: 0.85 mg/dL (ref 0.40–1.20)
GFR: 71.09 mL/min (ref >60.00)
Glucose, Bld: 227 mg/dL — ABNORMAL HIGH (ref 70–99)
Potassium: 4.3 mEq/L (ref 3.5–5.1)
Sodium: 137 mEq/L (ref 135–145)

## 2022-06-21 LAB — MICROALBUMIN / CREATININE URINE RATIO
Creatinine,U: 41.1 mg/dL
Microalb Creat Ratio: 1.7 mg/g (ref 0.0–30.0)
Microalb, Ur: <0.7 mg/dL (ref 0.0–1.9)

## 2022-06-21 LAB — HEMOGLOBIN A1C: Hgb A1c MFr Bld: 8.5 % — ABNORMAL HIGH (ref 4.6–6.5)

## 2022-06-21 MED ORDER — PREDNISONE 20 MG PO TABS: ORAL_TABLET | ORAL | 0 refills | Status: DC

## 2022-06-21 MED ORDER — GABAPENTIN 100 MG PO CAPS: 100.0000 mg | ORAL_CAPSULE | Freq: Every day | ORAL | 3 refills | Status: DC

## 2022-06-21 MED ORDER — FLUTICASONE PROPIONATE 50 MCG/ACT NA SUSP
NASAL | 3 refills | Status: DC
Start: 2022-06-21 — End: 2023-09-01

## 2022-06-21 MED ORDER — GLIPIZIDE 10 MG PO TABS
10.0000 mg | ORAL_TABLET | Freq: Two times a day (BID) | ORAL | 3 refills | Status: DC
Start: 2022-06-21 — End: 2023-04-04

## 2022-06-21 MED ORDER — ALBUTEROL SULFATE HFA 108 (90 BASE) MCG/ACT IN AERS: 2.0000 | INHALATION_SPRAY | Freq: Four times a day (QID) | RESPIRATORY_TRACT | 0 refills | Status: DC | PRN

## 2022-06-21 MED ORDER — HYDROCODONE-ACETAMINOPHEN 5-325 MG PO TABS: ORAL_TABLET | ORAL | 0 refills | Status: DC

## 2022-06-21 MED ORDER — TRAMADOL HCL 50 MG PO TABS: ORAL_TABLET | ORAL | 5 refills | Status: DC

## 2022-06-21 MED ORDER — ATORVASTATIN CALCIUM 20 MG PO TABS: 20.0000 mg | ORAL_TABLET | Freq: Every day | ORAL | 3 refills | Status: DC

## 2022-06-21 MED ORDER — LISINOPRIL 2.5 MG PO TABS: 2.5000 mg | ORAL_TABLET | Freq: Every day | ORAL | 1 refills | Status: DC

## 2022-06-21 MED ORDER — DICLOFENAC SODIUM 1 % EX GEL: CUTANEOUS | 3 refills | Status: AC

## 2022-06-21 MED ORDER — PIOGLITAZONE HCL 45 MG PO TABS: 45.0000 mg | ORAL_TABLET | Freq: Every day | ORAL | 3 refills | Status: DC

## 2022-06-21 MED ORDER — CANAGLIFLOZIN 300 MG PO TABS: 300.0000 mg | ORAL_TABLET | Freq: Every day | ORAL | 0 refills | Status: DC
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
## 2022-06-24 ENCOUNTER — Telehealth: Payer: Self-pay

## 2022-06-24 DIAGNOSIS — J40 Bronchitis, not specified as acute or chronic: Secondary | ICD-10-CM

## 2022-06-24 NOTE — Telephone Encounter (Signed)
Pt was last seen 11/10, last rx given by another provider.  Please fill, if appropriate.

## 2022-06-24 NOTE — Telephone Encounter (Signed)
Patient refill request. [place]  doxycycline (VIBRA-TABS) 100 MG tablet [[id number]]

## 2022-06-24 NOTE — Telephone Encounter (Signed)
I recommend she get a chest x-ray.  Please order this for the imaging location of her choice, diagnosis acute bronchitis.

## 2022-06-25 ENCOUNTER — Ambulatory Visit (HOSPITAL_BASED_OUTPATIENT_CLINIC_OR_DEPARTMENT_OTHER)
Admission: RE | Admit: 2022-06-25 | Discharge: 2022-06-25 | Disposition: A | Discharge status: Home / Self Care | Payer: Medicare HMO | Source: Ambulatory Visit | Attending: Family Medicine | Admitting: Family Medicine

## 2022-06-25 ENCOUNTER — Telehealth: Payer: Self-pay

## 2022-06-25 DIAGNOSIS — J40 Bronchitis, not specified as acute or chronic: Secondary | ICD-10-CM | POA: Diagnosis not present

## 2022-06-25 DIAGNOSIS — R059 Cough, unspecified: Secondary | ICD-10-CM | POA: Diagnosis not present

## 2022-06-25 NOTE — Telephone Encounter (Signed)
Patient wanting to know if she can try Trulicity or Mounjaro before going on insulin  Please call [phone]

## 2022-06-25 NOTE — Telephone Encounter (Signed)
Order complete. 

## 2022-06-25 NOTE — Telephone Encounter (Signed)
Please review and advise.

## 2022-06-25 NOTE — Telephone Encounter (Signed)
Patient aware Dr. Anitra Lauth ordering chest xray.  She is requesting to go to [facility].  She is going to head that way about 9:30AM this morning.

## 2022-06-26 NOTE — Telephone Encounter (Signed)
Ask her to call her insurer to see if trulicity or mounjaro are covered before we rx them.  Also, make sure she knows that these medications work the same way as ozempic so their is a good likelihood that they will cause the same side effect. Let me know.

## 2022-06-26 NOTE — Telephone Encounter (Signed)
Pt will contact insurance regarding medications.

## 2022-06-26 NOTE — Telephone Encounter (Addendum)
Tried calling patient, unable to LVM.  

## 2022-06-27 ENCOUNTER — Other Ambulatory Visit: Payer: Self-pay | Admitting: Family Medicine

## 2022-06-27 MED ORDER — HYDROCODONE BIT-HOMATROP MBR 5-1.5 MG/5ML PO SOLN: ORAL | 0 refills | Status: DC

## 2022-07-19 ENCOUNTER — Other Ambulatory Visit: Payer: Self-pay | Admitting: Family Medicine

## 2022-07-19 NOTE — Telephone Encounter (Signed)
Requesting: [employer] Contract: 12/06/21 UDS: 12/07/21 Last Visit: 06/21/22 Next Visit: f/u 3 mo Last Refill: 06/27/22 (90,0)  Please Advise. Med pending

## 2022-07-30 ENCOUNTER — Ambulatory Visit
Admission: RE | Admit: 2022-07-30 | Discharge: 2022-07-30 | Disposition: A | Discharge status: Home / Self Care | Payer: Medicare HMO | Source: Ambulatory Visit | Attending: Family Medicine | Admitting: Family Medicine

## 2022-07-30 DIAGNOSIS — Z1231 Encounter for screening mammogram for malignant neoplasm of breast: Secondary | ICD-10-CM | POA: Diagnosis not present

## 2022-08-22 ENCOUNTER — Other Ambulatory Visit: Payer: Self-pay | Admitting: Family Medicine

## 2022-08-23 NOTE — Telephone Encounter (Signed)
Patient refill requesting.  Pharmacy sent over yesterday morning.  Patient states she is need of medication before weekend if at all possible.  Thank you

## 2022-08-23 NOTE — Telephone Encounter (Signed)
Requesting: [employer] Contract: 12/06/21 UDS:12/07/21 Last Visit: 06/21/22 Next Visit: 09/23/22 Last Refill: 07/19/22 (90,0)  Please Advise. Med pending

## 2022-09-20 ENCOUNTER — Other Ambulatory Visit: Payer: Self-pay | Admitting: Family Medicine

## 2022-09-20 NOTE — Telephone Encounter (Signed)
Pt has upcoming appt on 2/12

## 2022-09-20 NOTE — Patient Instructions (Signed)

## 2022-09-23 ENCOUNTER — Ambulatory Visit (INDEPENDENT_AMBULATORY_CARE_PROVIDER_SITE_OTHER): Payer: Medicare HMO | Admitting: Family Medicine

## 2022-09-23 ENCOUNTER — Encounter: Payer: Self-pay | Admitting: Family Medicine

## 2022-09-23 VITALS — BP 110/72 | HR 73 | Temp 97.7°F | Ht 67.5 in | Wt 227.2 lb

## 2022-09-23 DIAGNOSIS — M545 Low back pain, unspecified: Secondary | ICD-10-CM

## 2022-09-23 DIAGNOSIS — G894 Chronic pain syndrome: Secondary | ICD-10-CM | POA: Diagnosis not present

## 2022-09-23 DIAGNOSIS — E7800 Pure hypercholesterolemia, unspecified: Secondary | ICD-10-CM | POA: Diagnosis not present

## 2022-09-23 DIAGNOSIS — E119 Type 2 diabetes mellitus without complications: Secondary | ICD-10-CM | POA: Diagnosis not present

## 2022-09-23 DIAGNOSIS — Z1211 Encounter for screening for malignant neoplasm of colon: Secondary | ICD-10-CM

## 2022-09-23 DIAGNOSIS — Z0000 Encounter for general adult medical examination without abnormal findings: Secondary | ICD-10-CM

## 2022-09-23 DIAGNOSIS — M79671 Pain in right foot: Secondary | ICD-10-CM

## 2022-09-23 DIAGNOSIS — I10 Essential (primary) hypertension: Secondary | ICD-10-CM

## 2022-09-23 DIAGNOSIS — M170 Bilateral primary osteoarthritis of knee: Secondary | ICD-10-CM | POA: Diagnosis not present

## 2022-09-23 DIAGNOSIS — E890 Postprocedural hypothyroidism: Secondary | ICD-10-CM | POA: Diagnosis not present

## 2022-09-23 DIAGNOSIS — G8929 Other chronic pain: Secondary | ICD-10-CM

## 2022-09-23 LAB — COMPREHENSIVE METABOLIC PANEL
ALT: 14 U/L (ref 0–35)
AST: 13 U/L (ref 0–37)
Albumin: 4 g/dL (ref 3.5–5.2)
Alkaline Phosphatase: 167 U/L — ABNORMAL HIGH (ref 39–117)
BUN: 12 mg/dL (ref 6–23)
CO2: 26 mEq/L (ref 19–32)
Calcium: 9.5 mg/dL (ref 8.4–10.5)
Chloride: 98 mEq/L (ref 96–112)
Creatinine, Ser: 0.81 mg/dL (ref 0.40–1.20)
GFR: 75.18 mL/min (ref >60.00)
Glucose, Bld: 234 mg/dL — ABNORMAL HIGH (ref 70–99)
Potassium: 4.2 mEq/L (ref 3.5–5.1)
Sodium: 136 mEq/L (ref 135–145)
Total Bilirubin: 0.9 mg/dL (ref 0.2–1.2)
Total Protein: 6.9 g/dL (ref 6.0–8.3)

## 2022-09-23 LAB — LIPID PANEL
Cholesterol: 143 mg/dL (ref 0–200)
HDL: 56.9 mg/dL (ref >39.00)
LDL Cholesterol: 59 mg/dL (ref 0–99)
NonHDL: 85.77
Total CHOL/HDL Ratio: 3
Triglycerides: 133 mg/dL (ref 0.0–149.0)
VLDL: 26.6 mg/dL (ref 0.0–40.0)

## 2022-09-23 LAB — CBC
HCT: 44.6 % (ref 36.0–46.0)
Hemoglobin: 14.9 g/dL (ref 12.0–15.0)
MCHC: 33.3 g/dL (ref 30.0–36.0)
MCV: 91.4 fl (ref 78.0–100.0)
Platelets: 263 10*3/uL (ref 150.0–400.0)
RBC: 4.88 Mil/uL (ref 3.87–5.11)
RDW: 14.2 % (ref 11.5–15.5)
WBC: 7.3 10*3/uL (ref 4.0–10.5)

## 2022-09-23 LAB — HEMOGLOBIN A1C: Hgb A1c MFr Bld: 11.2 % — ABNORMAL HIGH (ref 4.6–6.5)

## 2022-09-23 LAB — TSH: TSH: 3.12 u[IU]/mL (ref 0.35–5.50)

## 2022-09-23 MED ORDER — CANAGLIFLOZIN 300 MG PO TABS: 300.0000 mg | ORAL_TABLET | Freq: Every day | ORAL | 1 refills | Status: DC

## 2022-09-23 MED ORDER — TRAMADOL HCL 50 MG PO TABS: ORAL_TABLET | ORAL | 5 refills | Status: DC

## 2022-09-23 MED ORDER — HYDROCODONE-ACETAMINOPHEN 5-325 MG PO TABS: ORAL_TABLET | ORAL | 0 refills | Status: DC

## 2022-09-23 MED ORDER — ALENDRONATE SODIUM 70 MG PO TABS: ORAL_TABLET | ORAL | 1 refills | Status: DC

## 2022-09-23 MED ORDER — SEMAGLUTIDE(0.25 OR 0.5MG/DOS) 2 MG/3ML ~~LOC~~ SOPN: PEN_INJECTOR | SUBCUTANEOUS | 5 refills | Status: DC

## 2022-09-23 MED ORDER — ACCU-CHEK SOFTCLIX LANCETS MISC: 5 refills | Status: DC
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
## 2022-09-24 ENCOUNTER — Other Ambulatory Visit (INDEPENDENT_AMBULATORY_CARE_PROVIDER_SITE_OTHER): Payer: Medicare HMO

## 2022-09-24 DIAGNOSIS — R748 Abnormal levels of other serum enzymes: Secondary | ICD-10-CM | POA: Diagnosis not present

## 2022-09-24 DIAGNOSIS — M25511 Pain in right shoulder: Secondary | ICD-10-CM | POA: Diagnosis not present

## 2022-09-24 LAB — GAMMA GT: GGT: 23 U/L (ref 7–51)

## 2022-09-25 ENCOUNTER — Encounter: Payer: Self-pay | Admitting: Family Medicine

## 2022-09-25 NOTE — Telephone Encounter (Signed)
Ok that's fine

## 2022-10-19 ENCOUNTER — Other Ambulatory Visit: Payer: Self-pay | Admitting: Family Medicine

## 2022-11-14 ENCOUNTER — Other Ambulatory Visit: Payer: Self-pay | Admitting: Family Medicine

## 2022-11-15 NOTE — Telephone Encounter (Signed)
Requesting: [employer] Contract: 12/06/21 UDS: 12/07/21 Last Visit: 09/23/22 Next Visit: 12/23/22 Last Refill: 09/23/22 (90,0)  Please Advise. Med pending

## 2022-11-20 ENCOUNTER — Other Ambulatory Visit: Payer: Self-pay | Admitting: Family Medicine

## 2022-11-21 ENCOUNTER — Other Ambulatory Visit: Payer: Medicare HMO

## 2022-12-16 ENCOUNTER — Other Ambulatory Visit: Payer: Self-pay | Admitting: Family Medicine

## 2022-12-16 NOTE — Telephone Encounter (Signed)
RF request for lisinopril LOV: 09/23/22 Next ov: 12/23/22 Last written: 06/21/22 (90,1)   Requesting: [employer] Contract: 12/06/21 UDS: 12/07/21 Last Visit:09/23/22 Next Visit:12/23/22 Last Refill: 11/15/22 (90,0)  Please Advise. Meds pending

## 2022-12-19 NOTE — Patient Instructions (Signed)
   It was very nice to see you today!   PLEASE NOTE:   If you had any lab tests please let us know if you have not heard back within a few days. You may see your results on MyChart before we have a chance to review them but we will give you a call once they are reviewed by [place]. If we ordered any referrals today, please let us know if you have not heard from their office within the next 2 weeks. You should receive a letter via MyChart confirming if the referral was approved and their office contact information to schedule.  Please try these tips to maintain a healthy lifestyle:  Eat most of your calories during the day when you are active. Eliminate processed foods including packaged sweets (pies, cakes, cookies), reduce intake of potatoes, white bread, white pasta, and white rice. Look for whole grain options, oat flour or almond flour.  Each meal should contain half fruits/vegetables, one quarter protein, and one quarter carbs (no bigger than a computer mouse).  Cut down on sweet beverages. This includes juice, soda, and sweet tea. Also watch fruit intake, though this is a healthier sweet option, it still contains natural sugar! Limit to 3 servings daily.  Drink at least 1 glass of water with each meal and aim for at least 8 glasses per day  Exercise at least 150 minutes every week. =

## 2022-12-23 ENCOUNTER — Encounter: Payer: Self-pay | Admitting: Family Medicine

## 2022-12-23 ENCOUNTER — Ambulatory Visit (INDEPENDENT_AMBULATORY_CARE_PROVIDER_SITE_OTHER): Payer: Medicare HMO | Admitting: Family Medicine

## 2022-12-23 VITALS — BP 103/67 | HR 72 | Temp 98.0°F | Ht 67.5 in | Wt 226.6 lb

## 2022-12-23 DIAGNOSIS — Z7985 Long-term (current) use of injectable non-insulin antidiabetic drugs: Secondary | ICD-10-CM | POA: Diagnosis not present

## 2022-12-23 DIAGNOSIS — G894 Chronic pain syndrome: Secondary | ICD-10-CM

## 2022-12-23 DIAGNOSIS — M545 Low back pain, unspecified: Secondary | ICD-10-CM

## 2022-12-23 DIAGNOSIS — G8929 Other chronic pain: Secondary | ICD-10-CM

## 2022-12-23 DIAGNOSIS — M79671 Pain in right foot: Secondary | ICD-10-CM | POA: Diagnosis not present

## 2022-12-23 DIAGNOSIS — E119 Type 2 diabetes mellitus without complications: Secondary | ICD-10-CM

## 2022-12-23 DIAGNOSIS — I10 Essential (primary) hypertension: Secondary | ICD-10-CM

## 2022-12-23 DIAGNOSIS — M170 Bilateral primary osteoarthritis of knee: Secondary | ICD-10-CM | POA: Diagnosis not present

## 2022-12-23 LAB — BASIC METABOLIC PANEL
BUN: 12 mg/dL (ref 6–23)
CO2: 26 mEq/L (ref 19–32)
Calcium: 9 mg/dL (ref 8.4–10.5)
Chloride: 104 mEq/L (ref 96–112)
Creatinine, Ser: 0.77 mg/dL (ref 0.40–1.20)
GFR: 79.76 mL/min (ref >60.00)
Glucose, Bld: 143 mg/dL — ABNORMAL HIGH (ref 70–99)
Potassium: 4.4 mEq/L (ref 3.5–5.1)
Sodium: 140 mEq/L (ref 135–145)

## 2022-12-23 LAB — POCT GLYCOSYLATED HEMOGLOBIN (HGB A1C)
HbA1c POC (<> result, manual entry): 6.5 % (ref 4.0–5.6)
HbA1c, POC (controlled diabetic range): 6.5 % (ref 0.0–7.0)
HbA1c, POC (prediabetic range): 6.5 % — AB (ref 5.7–6.4)
Hemoglobin A1C: 6.5 % — AB (ref 4.0–5.6)

## 2022-12-23 MED ORDER — HYDROCODONE-ACETAMINOPHEN 5-325 MG PO TABS: ORAL_TABLET | ORAL | 0 refills | Status: DC

## 2022-12-23 MED ORDER — LEVOTHYROXINE SODIUM 125 MCG PO TABS: 125.0000 ug | ORAL_TABLET | Freq: Every day | ORAL | 1 refills | Status: DC

## 2022-12-23 MED ORDER — TRAMADOL HCL 50 MG PO TABS: ORAL_TABLET | ORAL | 5 refills | Status: DC

## 2022-12-23 MED ORDER — ONDANSETRON HCL 4 MG PO TABS: ORAL_TABLET | ORAL | 6 refills | Status: DC
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
## 2022-12-31 DIAGNOSIS — M25511 Pain in right shoulder: Secondary | ICD-10-CM | POA: Diagnosis not present

## 2023-01-31 ENCOUNTER — Other Ambulatory Visit: Payer: Self-pay | Admitting: Family Medicine

## 2023-02-03 DIAGNOSIS — M7918 Myalgia, other site: Secondary | ICD-10-CM | POA: Diagnosis not present

## 2023-02-03 DIAGNOSIS — M545 Low back pain, unspecified: Secondary | ICD-10-CM | POA: Diagnosis not present

## 2023-02-19 ENCOUNTER — Other Ambulatory Visit: Payer: Self-pay | Admitting: Family Medicine

## 2023-02-23 IMAGING — DX DG CHEST 2V
2 series · 2 of 2 positions shown · non-contrast
Comparison: 06/24/2013

CLINICAL DATA: Preop for thyroid surgery

EXAM:
CHEST - 2 VIEW

[chest pa]
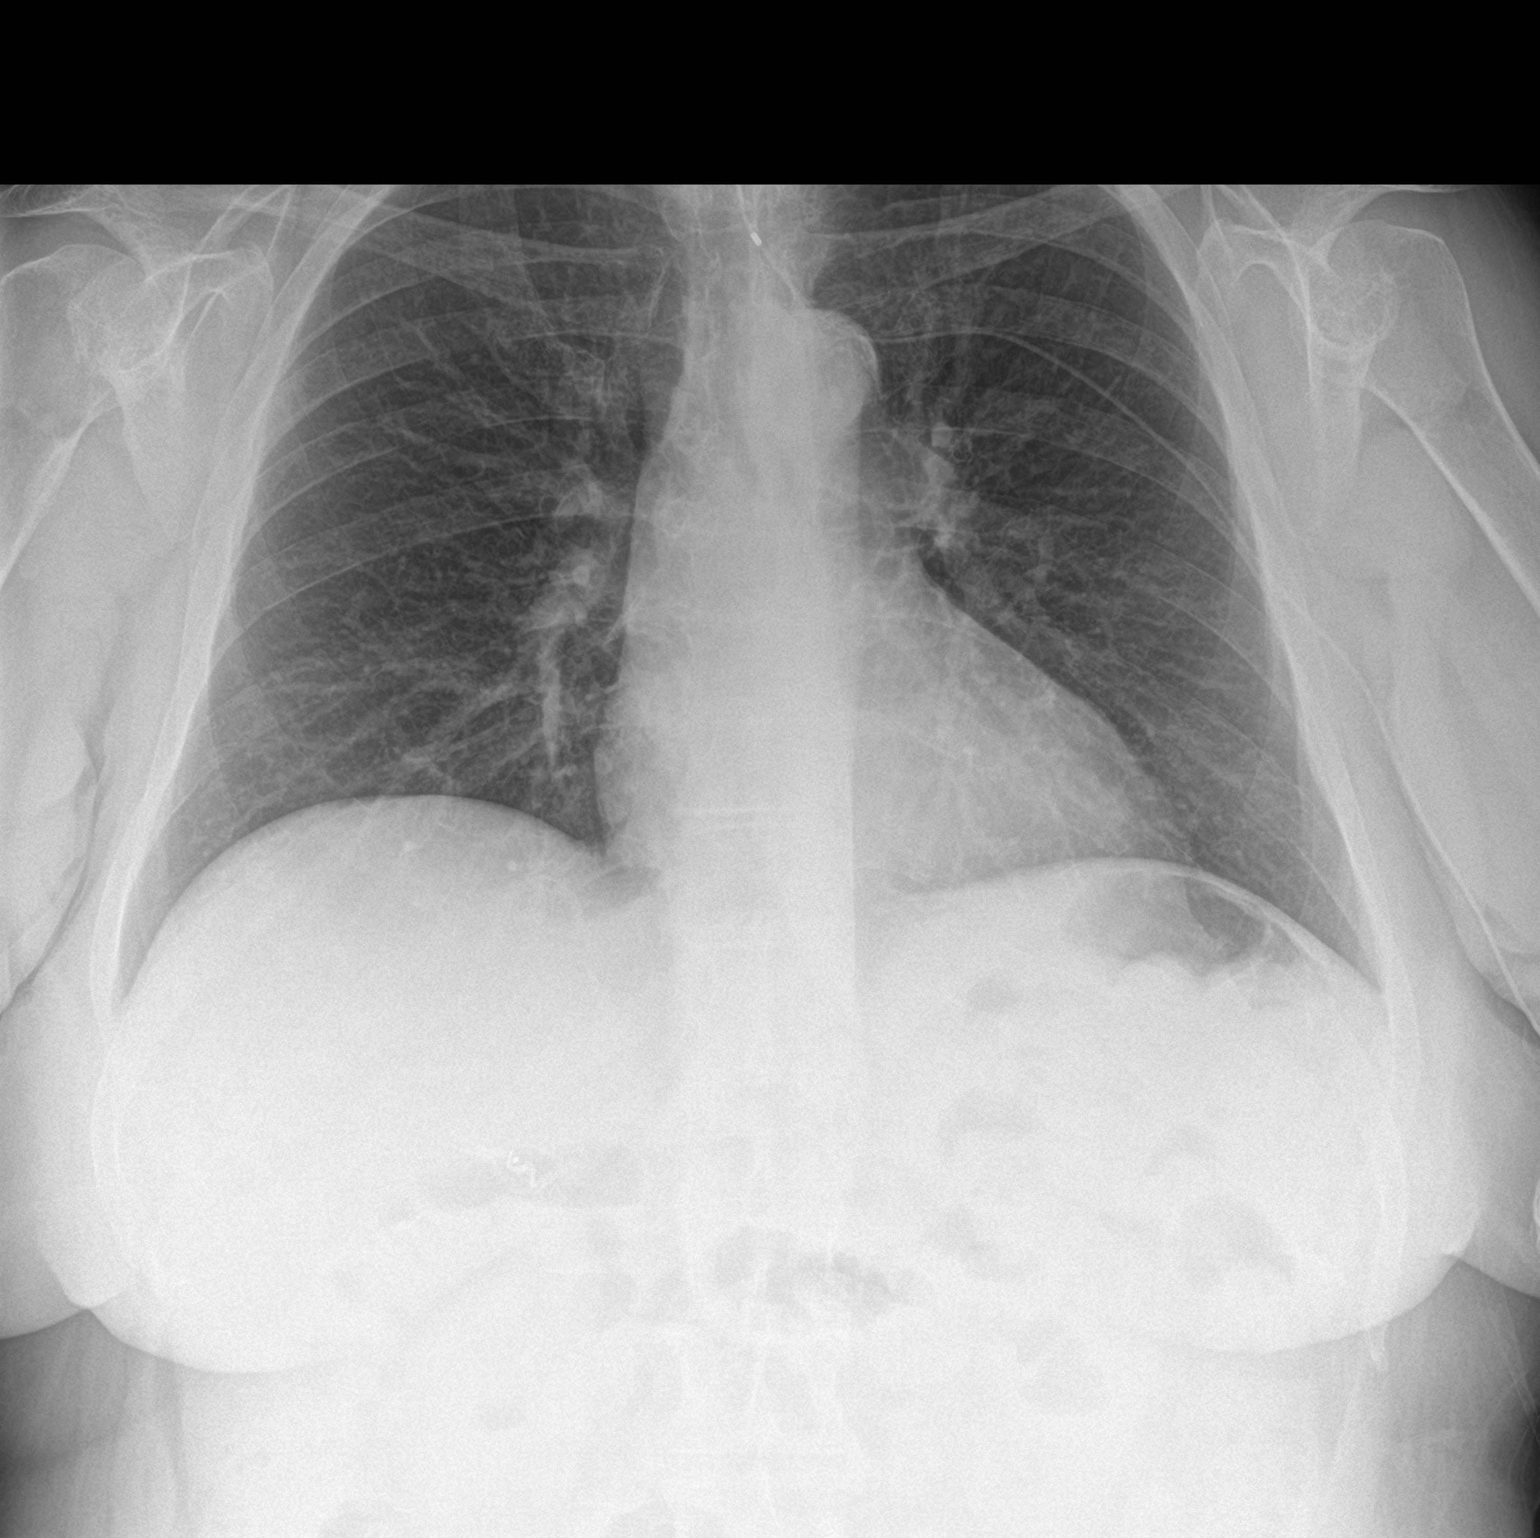

[chest lat]
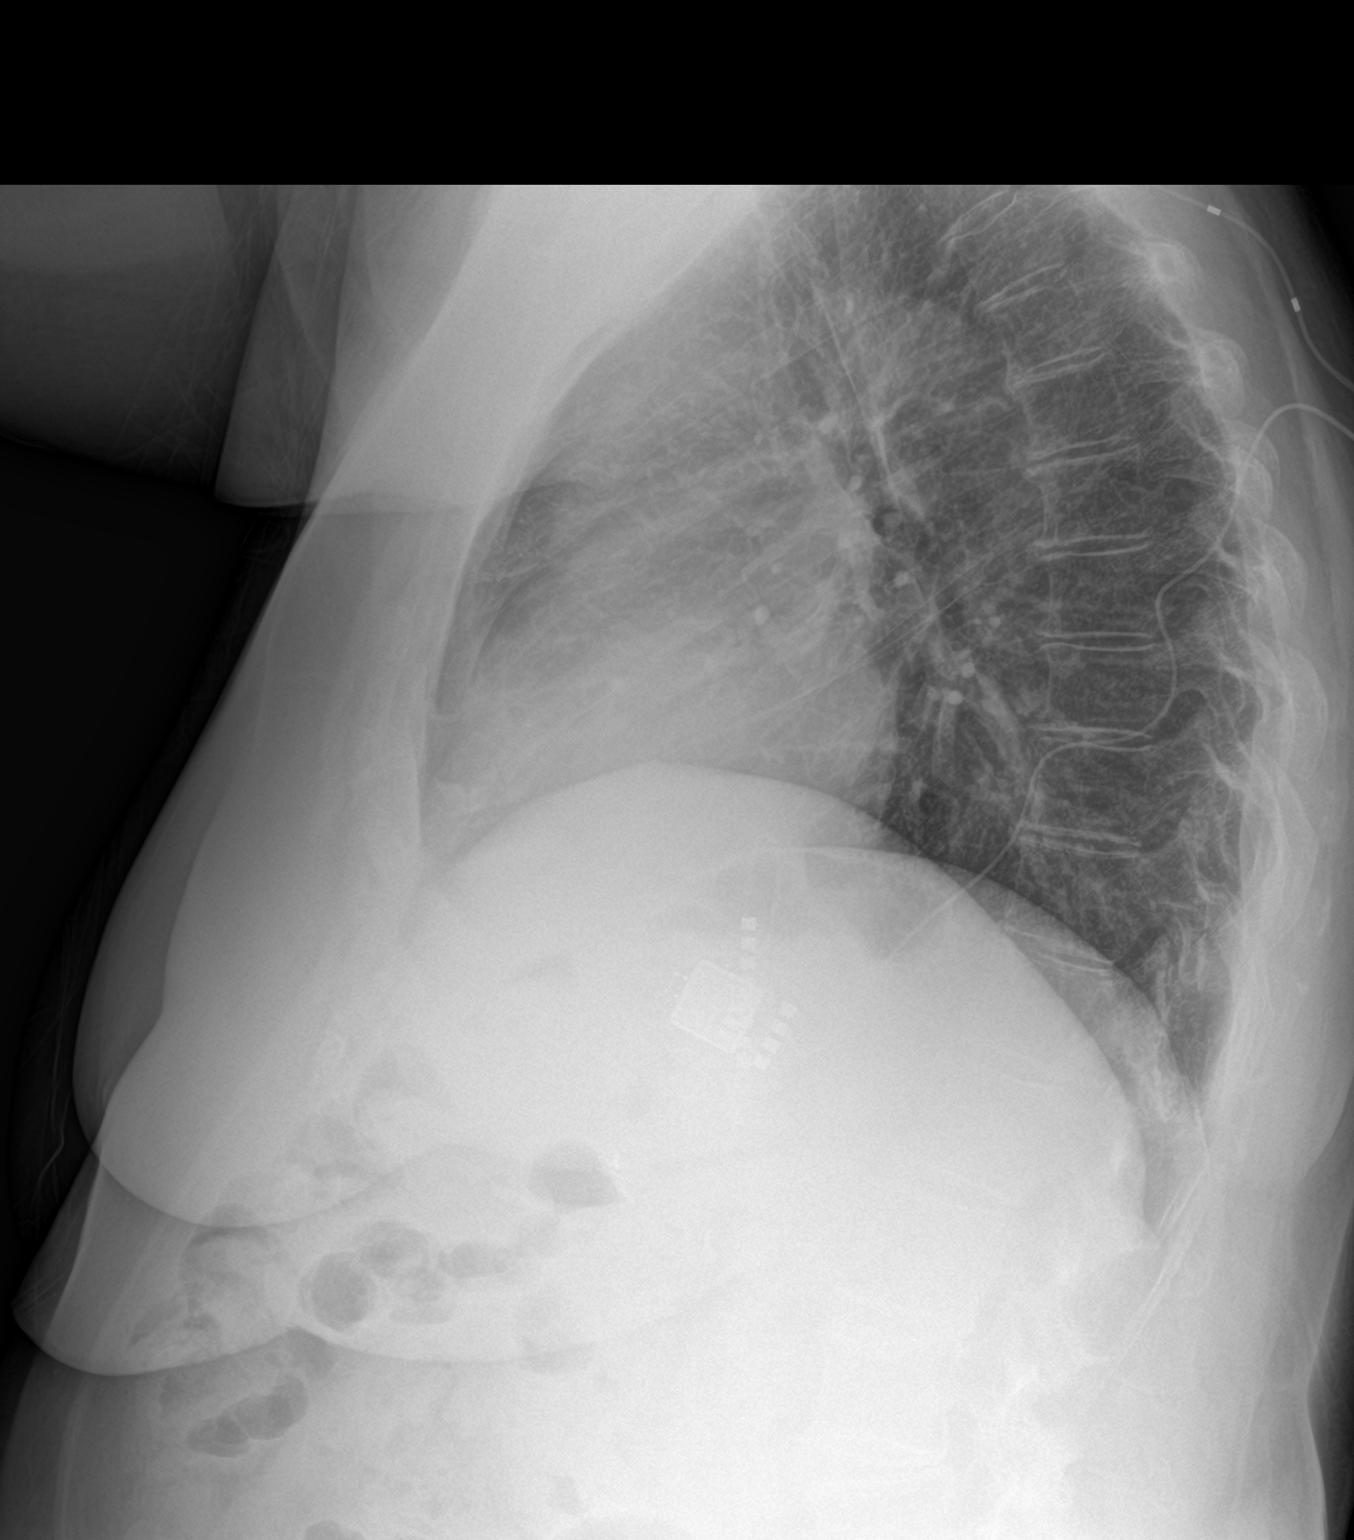

[2 of 2 positions shown; findings below may reference images not displayed]

FINDINGS: A stimulator projects about the posterior left chest and extends
above the superior aspect of the film.

Midline trachea. Normal heart size. Atherosclerosis in the
transverse aorta. No pleural effusion or pneumothorax. Clear lungs.
Cholecystectomy.
IMPRESSION: No active cardiopulmonary disease.

## 2023-03-10 ENCOUNTER — Other Ambulatory Visit: Payer: Self-pay | Admitting: Family Medicine

## 2023-03-10 NOTE — Telephone Encounter (Signed)
Last OV 12/23/22 Next OV 03/25/23 Last RF 09/23/22 (3ml, 5)  Please fill, if appropriate.

## 2023-03-12 ENCOUNTER — Encounter (INDEPENDENT_AMBULATORY_CARE_PROVIDER_SITE_OTHER): Payer: Self-pay

## 2023-03-21 ENCOUNTER — Other Ambulatory Visit: Payer: Self-pay | Admitting: Family Medicine

## 2023-03-25 ENCOUNTER — Ambulatory Visit: Payer: Medicare HMO | Admitting: Family Medicine

## 2023-03-27 ENCOUNTER — Encounter: Payer: Self-pay | Admitting: Family Medicine

## 2023-04-01 NOTE — Patient Instructions (Addendum)
It was very nice to see you today!  Stop your lisinopril.  PLEASE NOTE:  If labs were collected or images ordered, we will inform you of  results once we have received them and reviewed. We will contact you either by echart message, or telephone call.     Please give ample time to the testing facility, and our office to run, receive and review results. Please do not call inquiring of results, even if you can see them in your chart. We will contact you as soon as we are able. If it has been over 1 week since the test was completed, and you have not yet heard from [place], then please call us.   If we ordered any referrals today, please let us know if you have not heard from their office within the next 2 weeks. You should receive a letter via MyChart confirming if the referral was approved and their office contact information to schedule.

## 2023-04-04 ENCOUNTER — Ambulatory Visit (INDEPENDENT_AMBULATORY_CARE_PROVIDER_SITE_OTHER): Payer: Medicare HMO | Admitting: Family Medicine

## 2023-04-04 ENCOUNTER — Encounter: Payer: Self-pay | Admitting: Family Medicine

## 2023-04-04 VITALS — BP 91/57 | HR 68 | Temp 97.6°F | Ht 67.5 in | Wt 230.2 lb

## 2023-04-04 DIAGNOSIS — M5136 Other intervertebral disc degeneration, lumbar region: Secondary | ICD-10-CM | POA: Diagnosis not present

## 2023-04-04 DIAGNOSIS — E119 Type 2 diabetes mellitus without complications: Secondary | ICD-10-CM | POA: Diagnosis not present

## 2023-04-04 DIAGNOSIS — G8929 Other chronic pain: Secondary | ICD-10-CM

## 2023-04-04 DIAGNOSIS — M170 Bilateral primary osteoarthritis of knee: Secondary | ICD-10-CM | POA: Diagnosis not present

## 2023-04-04 DIAGNOSIS — Z7985 Long-term (current) use of injectable non-insulin antidiabetic drugs: Secondary | ICD-10-CM

## 2023-04-04 DIAGNOSIS — R748 Abnormal levels of other serum enzymes: Secondary | ICD-10-CM

## 2023-04-04 DIAGNOSIS — M25511 Pain in right shoulder: Secondary | ICD-10-CM | POA: Diagnosis not present

## 2023-04-04 DIAGNOSIS — M79671 Pain in right foot: Secondary | ICD-10-CM | POA: Diagnosis not present

## 2023-04-04 DIAGNOSIS — I10 Essential (primary) hypertension: Secondary | ICD-10-CM | POA: Diagnosis not present

## 2023-04-04 DIAGNOSIS — G894 Chronic pain syndrome: Secondary | ICD-10-CM | POA: Diagnosis not present

## 2023-04-04 LAB — COMPREHENSIVE METABOLIC PANEL
ALT: 14 U/L (ref 0–35)
AST: 15 U/L (ref 0–37)
Albumin: 3.6 g/dL (ref 3.5–5.2)
Alkaline Phosphatase: 113 U/L (ref 39–117)
BUN: 14 mg/dL (ref 6–23)
CO2: 29 mEq/L (ref 19–32)
Calcium: 8.8 mg/dL (ref 8.4–10.5)
Chloride: 103 mEq/L (ref 96–112)
Creatinine, Ser: 0.84 mg/dL (ref 0.40–1.20)
GFR: 71.71 mL/min (ref >60.00)
Glucose, Bld: 145 mg/dL — ABNORMAL HIGH (ref 70–99)
Potassium: 4.6 mEq/L (ref 3.5–5.1)
Sodium: 138 mEq/L (ref 135–145)
Total Bilirubin: 0.8 mg/dL (ref 0.2–1.2)
Total Protein: 6.5 g/dL (ref 6.0–8.3)

## 2023-04-04 LAB — POCT GLYCOSYLATED HEMOGLOBIN (HGB A1C)
HbA1c POC (<> result, manual entry): 6.7 % (ref 4.0–5.6)
HbA1c, POC (controlled diabetic range): 6.7 % (ref 0.0–7.0)
HbA1c, POC (prediabetic range): 6.7 % — AB (ref 5.7–6.4)
Hemoglobin A1C: 6.7 % — AB (ref 4.0–5.6)

## 2023-04-04 MED ORDER — GLIPIZIDE 10 MG PO TABS: 10.0000 mg | ORAL_TABLET | Freq: Two times a day (BID) | ORAL | 3 refills | Status: DC

## 2023-04-04 MED ORDER — GABAPENTIN 100 MG PO CAPS: 100.0000 mg | ORAL_CAPSULE | Freq: Every day | ORAL | 3 refills | Status: DC

## 2023-04-04 MED ORDER — ATORVASTATIN CALCIUM 20 MG PO TABS: 20.0000 mg | ORAL_TABLET | Freq: Every day | ORAL | 3 refills | Status: DC

## 2023-04-04 MED ORDER — PIOGLITAZONE HCL 45 MG PO TABS: 45.0000 mg | ORAL_TABLET | Freq: Every day | ORAL | 3 refills | Status: DC

## 2023-04-04 MED ORDER — TRAMADOL HCL 50 MG PO TABS: ORAL_TABLET | ORAL | 5 refills | Status: DC

## 2023-04-04 MED ORDER — ALENDRONATE SODIUM 70 MG PO TABS: ORAL_TABLET | ORAL | 1 refills | Status: DC

## 2023-04-04 MED ORDER — CANAGLIFLOZIN 300 MG PO TABS: 300.0000 mg | ORAL_TABLET | Freq: Every day | ORAL | 1 refills | Status: DC

## 2023-04-04 MED ORDER — METHOCARBAMOL 500 MG PO TABS: ORAL_TABLET | ORAL | 1 refills | Status: DC

## 2023-04-04 MED ORDER — HYDROCODONE-ACETAMINOPHEN 5-325 MG PO TABS: ORAL_TABLET | ORAL | 0 refills | Status: DC
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
## 2023-04-06 LAB — DRUG MONITORING PANEL 376104, URINE
Amphetamines: NEGATIVE ng/mL (ref <500)
Barbiturates: NEGATIVE ng/mL (ref <300)
Benzodiazepines: NEGATIVE ng/mL (ref <100)
Cocaine Metabolite: NEGATIVE ng/mL (ref <150)
Codeine: NEGATIVE ng/mL (ref <50)
Desmethyltramadol: NEGATIVE ng/mL (ref <100)
Hydrocodone: 242 ng/mL — ABNORMAL HIGH (ref <50)
Hydromorphone: NEGATIVE ng/mL (ref <50)
Morphine: NEGATIVE ng/mL (ref <50)
Norhydrocodone: 481 ng/mL — ABNORMAL HIGH (ref <50)
Opiates: POSITIVE ng/mL — AB (ref <100)
Oxycodone: NEGATIVE ng/mL (ref <100)
Tramadol: NEGATIVE ng/mL (ref <100)

## 2023-04-06 LAB — DM TEMPLATE

## 2023-04-21 ENCOUNTER — Other Ambulatory Visit: Payer: Self-pay

## 2023-04-21 ENCOUNTER — Other Ambulatory Visit (HOSPITAL_COMMUNITY): Payer: Self-pay

## 2023-04-21 MED ORDER — HYDROCODONE-ACETAMINOPHEN 5-325 MG PO TABS
1.0000 | ORAL_TABLET | Freq: Three times a day (TID) | ORAL | 0 refills | Status: DC | PRN
Start: 2023-04-21 — End: 2023-06-19
  Filled 2023-04-21: qty 90, 30d supply, fill #0

## 2023-04-21 NOTE — Telephone Encounter (Signed)
Prescription Request  04/21/2023  LOV: Visit date not found  What is the name of the medication or equipment? HYDROcodone-acetaminophen (NORCO/VICODIN) 5-325 MG tablet   Have you contacted your pharmacy to request a refill? No   Which pharmacy would you like this sent to?   [facility] [address] [place], [place], [place] [address]  [phone]     Patient notified that their request is being sent to the clinical staff for review and that they should receive a response within 2 business days.   Please advise at Mobile [phone] (mobile)

## 2023-04-21 NOTE — Telephone Encounter (Signed)
Requesting: HYDROcodone-acetaminophen (NORCO/VICODIN) 5-325 MG tablet  Contract: 12/23/22 UDS: 04/04/23 Last Visit: 04/04/23 Next Visit: 07/07/23 Last Refill: 04/04/23 (90,0)  Please Advise. Med pending

## 2023-04-22 ENCOUNTER — Other Ambulatory Visit (HOSPITAL_COMMUNITY): Payer: Self-pay

## 2023-04-22 ENCOUNTER — Ambulatory Visit: Payer: Medicare HMO | Admitting: Dermatology

## 2023-05-12 DIAGNOSIS — H5203 Hypermetropia, bilateral: Secondary | ICD-10-CM | POA: Diagnosis not present

## 2023-05-12 DIAGNOSIS — E119 Type 2 diabetes mellitus without complications: Secondary | ICD-10-CM | POA: Diagnosis not present

## 2023-05-12 DIAGNOSIS — H524 Presbyopia: Secondary | ICD-10-CM | POA: Diagnosis not present

## 2023-05-12 DIAGNOSIS — D3131 Benign neoplasm of right choroid: Secondary | ICD-10-CM | POA: Diagnosis not present

## 2023-05-12 DIAGNOSIS — H2513 Age-related nuclear cataract, bilateral: Secondary | ICD-10-CM | POA: Diagnosis not present

## 2023-05-14 DIAGNOSIS — E890 Postprocedural hypothyroidism: Secondary | ICD-10-CM | POA: Diagnosis not present

## 2023-05-14 DIAGNOSIS — Z85850 Personal history of malignant neoplasm of thyroid: Secondary | ICD-10-CM | POA: Diagnosis not present

## 2023-05-21 ENCOUNTER — Other Ambulatory Visit: Payer: Self-pay | Admitting: Family Medicine

## 2023-05-21 NOTE — Telephone Encounter (Signed)
Confirmed with pt, she is needing refill.   Ok to send?

## 2023-05-23 NOTE — Telephone Encounter (Signed)
Pt advised refill sent. °

## 2023-05-26 ENCOUNTER — Ambulatory Visit
Admission: RE | Admit: 2023-05-26 | Discharge: 2023-05-26 | Disposition: A | Discharge status: Home / Self Care | Payer: Medicare HMO | Source: Ambulatory Visit | Attending: Family Medicine | Admitting: Family Medicine

## 2023-05-26 ENCOUNTER — Encounter: Payer: Self-pay | Admitting: Family Medicine

## 2023-05-26 DIAGNOSIS — M8588 Other specified disorders of bone density and structure, other site: Secondary | ICD-10-CM | POA: Diagnosis not present

## 2023-05-26 DIAGNOSIS — M810 Age-related osteoporosis without current pathological fracture: Secondary | ICD-10-CM

## 2023-05-26 DIAGNOSIS — E349 Endocrine disorder, unspecified: Secondary | ICD-10-CM | POA: Diagnosis not present

## 2023-05-26 DIAGNOSIS — N958 Other specified menopausal and perimenopausal disorders: Secondary | ICD-10-CM | POA: Diagnosis not present

## 2023-05-28 DIAGNOSIS — Z85850 Personal history of malignant neoplasm of thyroid: Secondary | ICD-10-CM | POA: Diagnosis not present

## 2023-05-28 DIAGNOSIS — E890 Postprocedural hypothyroidism: Secondary | ICD-10-CM | POA: Diagnosis not present

## 2023-05-28 DIAGNOSIS — L659 Nonscarring hair loss, unspecified: Secondary | ICD-10-CM | POA: Diagnosis not present

## 2023-06-18 ENCOUNTER — Ambulatory Visit: Payer: Medicare HMO | Admitting: *Deleted

## 2023-06-18 DIAGNOSIS — Z0000 Encounter for general adult medical examination without abnormal findings: Secondary | ICD-10-CM | POA: Diagnosis not present
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
## 2023-06-18 NOTE — Patient Instructions (Signed)
Claudia [name] , Thank you for taking time to come for your Medicare Wellness Visit. I appreciate your ongoing commitment to your health goals. Please review the following plan we discussed and let me know if I can assist you in the future.   Screening recommendations/referrals: Colonoscopy: Education provided Recommended yearly ophthalmology/optometry visit for glaucoma screening and checkup Recommended yearly dental visit for hygiene and checkup  Vaccinations: Influenza vaccine: up to date Pneumococcal vaccine: up to date Tdap vaccine: up to date Shingles vaccine: up to date    Advanced directives: up to date    Preventive Care 65 Years and Older, Female Preventive care refers to lifestyle choices and visits with your health care provider that can promote health and wellness. What does preventive care include? A yearly physical exam. This is also called an annual well check. Dental exams once or twice a year. Routine eye exams. Ask your health care provider how often you should have your eyes checked. Personal lifestyle choices, including: Daily care of your teeth and gums. Regular physical activity. Eating a healthy diet. Avoiding tobacco and drug use. Limiting alcohol use. Practicing safe sex. Taking low doses of aspirin every day. Taking vitamin and mineral supplements as recommended by your health care provider. What happens during an annual well check? The services and screenings done by your health care provider during your annual well check will depend on your age, overall health, lifestyle risk factors, and family history of disease. Counseling  Your health care provider may ask you questions about your: Alcohol use. Tobacco use. Drug use. Emotional well-being. Home and relationship well-being. Sexual activity. Eating habits. History of falls. Memory and ability to understand (cognition). Work and work Astronomer. Screening  You may have the following tests or  measurements: Height, weight, and BMI. Blood pressure. Lipid and cholesterol levels. These may be checked every 5 years, or more frequently if you are over 59 years old. Skin check. Lung cancer screening. You may have this screening every year starting at age 19 if you have a 30-pack-year history of smoking and currently smoke or have quit within the past 15 years. Fecal occult blood test (FOBT) of the stool. You may have this test every year starting at age 103. Flexible sigmoidoscopy or colonoscopy. You may have a sigmoidoscopy every 5 years or a colonoscopy every 10 years starting at age 9. Prostate cancer screening. Recommendations will vary depending on your family history and other risks. Hepatitis C blood test. Hepatitis B blood test. Sexually transmitted disease (STD) testing. Diabetes screening. This is done by checking your blood sugar (glucose) after you have not eaten for a while (fasting). You may have this done every 1-3 years. Abdominal aortic aneurysm (AAA) screening. You may need this if you are a current or former smoker. Osteoporosis. You may be screened starting at age 1 if you are at high risk. Talk with your health care provider about your test results, treatment options, and if necessary, the need for more tests. Vaccines  Your health care provider may recommend certain vaccines, such as: Influenza vaccine. This is recommended every year. Tetanus, diphtheria, and acellular pertussis (Tdap, Td) vaccine. You may need a Td booster every 10 years. Zoster vaccine. You may need this after age 14. Pneumococcal 13-valent conjugate (PCV13) vaccine. One dose is recommended after age 64. Pneumococcal polysaccharide (PPSV23) vaccine. One dose is recommended after age 24. Talk to your health care provider about which screenings and vaccines you need and how often you need them.  This information is not intended to replace advice given to you by your health care provider. Make sure  you discuss any questions you have with your health care provider. Document Released: 08/25/2015 Document Revised: 04/17/2016 Document Reviewed: 05/30/2015 Elsevier Interactive Patient Education  2017 [employer].  Fall Prevention in the Home Falls can cause injuries. They can happen to people of all ages. There are many things you can do to make your home safe and to help prevent falls. What can I do on the outside of my home? Regularly fix the edges of walkways and driveways and fix any cracks. Remove anything that might make you trip as you walk through a door, such as a raised step or threshold. Trim any bushes or trees on the path to your home. Use bright outdoor lighting. Clear any walking paths of anything that might make someone trip, such as rocks or tools. Regularly check to see if handrails are loose or broken. Make sure that both sides of any steps have handrails. Any raised decks and porches should have guardrails on the edges. Have any leaves, snow, or ice cleared regularly. Use sand or salt on walking paths during winter. Clean up any spills in your garage right away. This includes oil or grease spills. What can I do in the bathroom? Use night lights. Install grab bars by the toilet and in the tub and shower. Do not use towel bars as grab bars. Use non-skid mats or decals in the tub or shower. If you need to sit down in the shower, use a plastic, non-slip stool. Keep the floor dry. Clean up any water that spills on the floor as soon as it happens. Remove soap buildup in the tub or shower regularly. Attach bath mats securely with double-sided non-slip rug tape. Do not have throw rugs and other things on the floor that can make you trip. What can I do in the bedroom? Use night lights. Make sure that you have a light by your bed that is easy to reach. Do not use any sheets or blankets that are too big for your bed. They should not hang down onto the floor. Have a firm  chair that has side arms. You can use this for support while you get dressed. Do not have throw rugs and other things on the floor that can make you trip. What can I do in the kitchen? Clean up any spills right away. Avoid walking on wet floors. Keep items that you use a lot in easy-to-reach places. If you need to reach something above you, use a strong step stool that has a grab bar. Keep electrical cords out of the way. Do not use floor polish or wax that makes floors slippery. If you must use wax, use non-skid floor wax. Do not have throw rugs and other things on the floor that can make you trip. What can I do with my stairs? Do not leave any items on the stairs. Make sure that there are handrails on both sides of the stairs and use them. Fix handrails that are broken or loose. Make sure that handrails are as long as the stairways. Check any carpeting to make sure that it is firmly attached to the stairs. Fix any carpet that is loose or worn. Avoid having throw rugs at the top or bottom of the stairs. If you do have throw rugs, attach them to the floor with carpet tape. Make sure that you have a light switch at the  top of the stairs and the bottom of the stairs. If you do not have them, ask someone to add them for you. What else can I do to help prevent falls? Wear shoes that: Do not have high heels. Have rubber bottoms. Are comfortable and fit you well. Are closed at the toe. Do not wear sandals. If you use a stepladder: Make sure that it is fully opened. Do not climb a closed stepladder. Make sure that both sides of the stepladder are locked into place. Ask someone to hold it for you, if possible. Clearly mark and make sure that you can see: Any grab bars or handrails. First and last steps. Where the edge of each step is. Use tools that help you move around (mobility aids) if they are needed. These include: Canes. Walkers. Scooters. Crutches. Turn on the lights when you go  into a dark area. Replace any light bulbs as soon as they burn out. Set up your furniture so you have a clear path. Avoid moving your furniture around. If any of your floors are uneven, fix them. If there are any pets around you, be aware of where they are. Review your medicines with your doctor. Some medicines can make you feel dizzy. This can increase your chance of falling. Ask your doctor what other things that you can do to help prevent falls. This information is not intended to replace advice given to you by your health care provider. Make sure you discuss any questions you have with your health care provider. Document Released: 05/25/2009 Document Revised: 01/04/2016 Document Reviewed: 09/02/2014 Elsevier Interactive Patient Education  2017 [employer].

## 2023-06-19 ENCOUNTER — Other Ambulatory Visit: Payer: Self-pay | Admitting: Family Medicine

## 2023-06-19 ENCOUNTER — Telehealth: Payer: Self-pay | Admitting: Family Medicine

## 2023-06-19 NOTE — Telephone Encounter (Signed)
Stu with [facility] called to verify why a medication was denied(Lisinopril). It looks like its due to discontinuation, however please give Stu a call back to confirm at [phone]

## 2023-07-01 ENCOUNTER — Encounter: Payer: Self-pay | Admitting: Dermatology

## 2023-07-01 ENCOUNTER — Ambulatory Visit: Payer: Medicare HMO | Admitting: Dermatology

## 2023-07-01 DIAGNOSIS — L578 Other skin changes due to chronic exposure to nonionizing radiation: Secondary | ICD-10-CM

## 2023-07-01 DIAGNOSIS — D492 Neoplasm of unspecified behavior of bone, soft tissue, and skin: Secondary | ICD-10-CM | POA: Diagnosis not present

## 2023-07-01 DIAGNOSIS — Z1283 Encounter for screening for malignant neoplasm of skin: Secondary | ICD-10-CM

## 2023-07-01 DIAGNOSIS — L649 Androgenic alopecia, unspecified: Secondary | ICD-10-CM

## 2023-07-01 DIAGNOSIS — D1801 Hemangioma of skin and subcutaneous tissue: Secondary | ICD-10-CM | POA: Diagnosis not present

## 2023-07-01 DIAGNOSIS — L570 Actinic keratosis: Secondary | ICD-10-CM | POA: Diagnosis not present

## 2023-07-01 DIAGNOSIS — W908XXA Exposure to other nonionizing radiation, initial encounter: Secondary | ICD-10-CM | POA: Diagnosis not present

## 2023-07-01 DIAGNOSIS — L821 Other seborrheic keratosis: Secondary | ICD-10-CM | POA: Diagnosis not present

## 2023-07-01 DIAGNOSIS — Z79899 Other long term (current) drug therapy: Secondary | ICD-10-CM

## 2023-07-01 DIAGNOSIS — L814 Other melanin hyperpigmentation: Secondary | ICD-10-CM | POA: Diagnosis not present

## 2023-07-01 DIAGNOSIS — D229 Melanocytic nevi, unspecified: Secondary | ICD-10-CM

## 2023-07-01 DIAGNOSIS — D485 Neoplasm of uncertain behavior of skin: Secondary | ICD-10-CM

## 2023-07-01 NOTE — Patient Instructions (Addendum)
Hello Claudia [name],  Thank you for visiting [place] today. Your dedication to addressing your health concerns and improving your overall well-being is greatly appreciated. Below is a summary of the key instructions from our consultation:  - Hair Thinning Treatment: Start applying Rogaine (Minoxidil 5%) daily to scalp.  - Supplemental Support: Start taking Viviscal, with a dosage of two tablets daily.   - Cost: Viviscal is $40 per month.  - Collagen Supplementation: Consider incorporating Vital Proteins collagen into your regimen for additional support.  - Over-the-Counter Solution: Purchase the minoxidil 5% from [employer] for best pricing, anticipating results within 3-4 months.  - Skin Care: Apply the moisturizer samples provided regularly, especially after showering, to address dry skin concerns.  - Post-Procedure Care: Following the shave removal of a spot on your lower leg today, ensure to apply Aquaphor and cover with a Band-Aid daily. We will contact you with the biopsy results in about a week.  - Preventative Treatment: We froze two spots on your skin today to prevent further issues.  - Follow-Up: Please schedule a follow-up appointment in three months to evaluate the progress of the hair thinning treatment and discuss any further necessary treatment.    Warm regards,  Dr. [facility]              Cryotherapy Aftercare  Wash gently with soap and water everyday.   Apply Vaseline and Band-Aid daily until healed.   Patient Handout: Wound Care for Skin Biopsy Site  Taking Care of Your Skin Biopsy Site  Proper care of the biopsy site is essential for promoting healing and minimizing scarring. This handout provides instructions on how to care for your biopsy site to ensure optimal recovery.  1. Cleaning the Wound:  Clean the biopsy site daily with gentle soap and water. Gently pat the area dry with a clean, soft towel. Avoid harsh scrubbing or rubbing the area,  as this can irritate the skin and delay healing.  2. Applying Aquaphor and Bandage:  After cleaning the wound, apply a thin layer of Aquaphor ointment to the biopsy site. Cover the area with a sterile bandage to protect it from dirt, bacteria, and friction. Change the bandage daily or as needed if it becomes soiled or wet.  3. Continued Care for One Week:  Repeat the cleaning, Aquaphor application, and bandaging process daily for one week following the biopsy procedure. Keeping the wound clean and moist during this initial healing period will help prevent infection and promote optimal healing.  4. Massaging Aquaphor into the Area:  ---After one week, discontinue the use of bandages but continue to apply Aquaphor to the biopsy site. ----Gently massage the Aquaphor into the area using circular motions. ---Massaging the skin helps to promote circulation and prevent the formation of scar tissue.   Additional Tips:  Avoid exposing the biopsy site to direct sunlight during the healing process, as this can cause hyperpigmentation or worsen scarring. If you experience any signs of infection, such as increased redness, swelling, warmth, or drainage from the wound, contact your healthcare provider immediately. Follow any additional instructions provided by your healthcare provider for caring for the biopsy site and managing any discomfort. Conclusion:  Taking proper care of your skin biopsy site is crucial for ensuring optimal healing and minimizing scarring. By following these instructions for cleaning, applying Aquaphor, and massaging the area, you can promote a smooth and successful recovery. If you have any questions or concerns about caring for your biopsy site, don't hesitate  to contact your healthcare provider for guidance.     Important Information  Due to recent changes in healthcare laws, you may see results of your pathology and/or laboratory studies on MyChart before the doctors  have had a chance to review them. We understand that in some cases there may be results that are confusing or concerning to you. Please understand that not all results are received at the same time and often the doctors may need to interpret multiple results in order to provide you with the best plan of care or course of treatment. Therefore, we ask that you please give [place] 2 business days to thoroughly review all your results before contacting the office for clarification. Should we see a critical lab result, you will be contacted sooner.   If You Need Anything After Your Visit  If you have any questions or concerns for your doctor, please call our main line at [phone] If no one answers, please leave a voicemail as directed and we will return your call as soon as possible. Messages left after 4 pm will be answered the following business day.   You may also send [place] a message via MyChart. We typically respond to MyChart messages within 1-2 business days.  For prescription refills, please ask your pharmacy to contact our office. Our fax number is [phone].  If you have an urgent issue when the clinic is closed that cannot wait until the next business day, you can page your doctor at the number below.    Please note that while we do our best to be available for urgent issues outside of office hours, we are not available 24/7.   If you have an urgent issue and are unable to reach [place], you may choose to seek medical care at your doctor's office, retail clinic, urgent care center, or emergency room.  If you have a medical emergency, please immediately call 911 or go to the emergency department. In the event of inclement weather, please call our main line at [phone] for an update on the status of any delays or closures.  Dermatology Medication Tips: Please keep the boxes that topical medications come in in order to help keep track of the instructions about where and how to use these.  Pharmacies typically print the medication instructions only on the boxes and not directly on the medication tubes.   If your medication is too expensive, please contact our office at [phone] or send [place] a message through MyChart.   We are unable to tell what your co-pay for medications will be in advance as this is different depending on your insurance coverage. However, we may be able to find a substitute medication at lower cost or fill out paperwork to get insurance to cover a needed medication.   If a prior authorization is required to get your medication covered by your insurance company, please allow [place] 1-2 business days to complete this process.  Drug prices often vary depending on where the prescription is filled and some pharmacies may offer cheaper prices.  The website www.goodrx.com contains coupons for medications through different pharmacies. The prices here do not account for what the cost may be with help from insurance (it may be cheaper with your insurance), but the website can give you the price if you did not use any insurance.  - You can print the associated coupon and take it with your prescription to the pharmacy.  - You may also stop by our office during regular  business hours and pick up a GoodRx coupon card.  - If you need your prescription sent electronically to a different pharmacy, notify our office through [facility] or by phone at [phone]

## 2023-07-01 NOTE — Progress Notes (Addendum)
New Patient Visit   Subjective  Claudia [name] is a 67 y.o. female who presents for the following:  Total Body Skin Exam (TBSE)  Patient present today for new patient visit for TBSE.The patient reports she has spots, moles and lesions to be evaluated, some may be new or changing and the patient may have concern these could be cancer. Patient has previously been treated by a dermatologist about 5-6 years ago. Patient reports she has hx of bx. Patient denies family history of skin cancers. Patient reports throughout her lifetime has had complete sun exposure. Currently, patient reports if she has excessive sun exposure, she does apply sunscreen and/or wears protective coverings.  The following portions of the chart were reviewed this encounter and updated as appropriate: medications, allergies, medical history  Review of Systems:  No other skin or systemic complaints except as noted in HPI or Assessment and Plan.  Objective  Well appearing patient in no apparent distress; mood and affect are within normal limits.  A full examination was performed including scalp, head, eyes, ears, nose, lips, neck, chest, axillae, abdomen, back, buttocks, bilateral upper extremities, bilateral lower extremities, hands, feet, fingers, toes, fingernails, and toenails. All findings within normal limits unless otherwise noted below.              Relevant exam findings are noted in the Assessment and Plan.  Left Lower Leg - Anterior 9mm pink crusted papule  Left Forearm - Anterior (2) Erythematous thin papules/macules with gritty scale.     Assessment & Plan   LENTIGINES, SEBORRHEIC KERATOSES, HEMANGIOMAS - Benign normal skin lesions - Benign-appearing - Call for any changes  MELANOCYTIC NEVI - Tan-brown and/or pink-flesh-colored symmetric macules and papules - Benign appearing on exam today - Observation - Call clinic for new or changing moles - Recommend daily use of broad spectrum spf 30+  sunscreen to sun-exposed areas.   MILD ACTINIC DAMAGE - Chronic condition, secondary to cumulative UV/sun exposure - diffuse scaly erythematous macules with underlying dyspigmentation - Recommend daily broad spectrum sunscreen SPF 30+ to sun-exposed areas, reapply every 2 hours as needed.  - Staying in the shade or wearing long sleeves, sun glasses (UVA+UVB protection) and wide brim hats (4-inch brim around the entire circumference of the hat) are also recommended for sun protection.  - Call for new or changing lesions.  SKIN CANCER SCREENING PERFORMED TODAY Neoplasm of uncertain behavior of skin Left Lower Leg - Anterior  Skin / nail biopsy Type of biopsy: tangential   Informed consent: discussed and consent obtained   Timeout: patient name, date of birth, surgical site, and procedure verified   Procedure prep:  Patient was prepped and draped in usual sterile fashion Prep type:  Isopropyl alcohol Anesthesia: the lesion was anesthetized in a standard fashion   Anesthetic:  1% lidocaine w/ epinephrine 1-100,000 buffered w/ 8.4% NaHCO3 Instrument used: DermaBlade   Hemostasis achieved with: aluminum chloride   Outcome: patient tolerated procedure well   Post-procedure details: sterile dressing applied and wound care instructions given   Dressing type: petrolatum gauze and bandage    Specimen 1 - Surgical pathology Differential Diagnosis: r/o   Check Margins: No  AK (actinic keratosis) (2) Left Forearm - Anterior    VITILIGO Exam: depigmented patches on upper body  Vitiligo is a chronic autoimmune condition which causes loss of skin pigment and is commonly seen on the face and may also involve areas of trauma like hands, elbows, knees, and ankles. There is no  cure and it is difficult to treat.  Treatments include topical steroids and other topical anti-inflammatory ointments/creams and topical and oral Jak inhibitors.  Sometimes narrow band UV light therapy or Xtrac laser is  helpful, both of which require twice weekly treatments for at least 3-6 months.  Antioxidant vitamins, such as Vitamins A,C,E,D, Folic Acid and B12 may be added to enhance treatment. Heliocare may also enhance treatment results.   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  In women androgenetic alopecia is commonly associated with menopause but may occur any time after puberty.  It causes hair thinning primarily on the crown with widening of the part and temporal hairline recession.  Can use OTC Rogaine (minoxidil) 5% solution/foam as directed.   Treatment Plan: - Encouraged using OTC Rogaine. Pt educated that it would take 3-40mo to see improvement. - Recommended using Viviscal and Vital Protein Collagen supplements to help with hair strengthening    Long term medication management.  Patient is using long term (months to years) prescription medication  to control their dermatologic condition.  These medications require periodic monitoring to evaluate for efficacy and side effects and may require periodic laboratory monitoring.    Return in about 3 months (around 10/01/2023) for hair loss.   Documentation: I have reviewed the above documentation for accuracy and completeness, and I agree with the above.   I, Shirron Marcha Solders, CMA, am acting as scribe for [employer], DO.   Langston Reusing, DO

## 2023-07-02 LAB — SURGICAL PATHOLOGY

## 2023-07-07 ENCOUNTER — Encounter: Payer: Self-pay | Admitting: Family Medicine

## 2023-07-07 ENCOUNTER — Telehealth: Payer: Self-pay | Admitting: Family Medicine

## 2023-07-07 ENCOUNTER — Ambulatory Visit (INDEPENDENT_AMBULATORY_CARE_PROVIDER_SITE_OTHER): Payer: Medicare HMO | Admitting: Family Medicine

## 2023-07-07 VITALS — BP 111/73 | HR 74 | Ht 65.0 in | Wt 235.6 lb

## 2023-07-07 DIAGNOSIS — G894 Chronic pain syndrome: Secondary | ICD-10-CM | POA: Diagnosis not present

## 2023-07-07 DIAGNOSIS — I10 Essential (primary) hypertension: Secondary | ICD-10-CM | POA: Diagnosis not present

## 2023-07-07 DIAGNOSIS — Z7984 Long term (current) use of oral hypoglycemic drugs: Secondary | ICD-10-CM | POA: Diagnosis not present

## 2023-07-07 DIAGNOSIS — E119 Type 2 diabetes mellitus without complications: Secondary | ICD-10-CM | POA: Diagnosis not present

## 2023-07-07 LAB — COMPREHENSIVE METABOLIC PANEL
ALT: 13 U/L (ref 0–35)
AST: 12 U/L (ref 0–37)
Albumin: 4.1 g/dL (ref 3.5–5.2)
Alkaline Phosphatase: 156 U/L — ABNORMAL HIGH (ref 39–117)
BUN: 12 mg/dL (ref 6–23)
CO2: 27 meq/L (ref 19–32)
Calcium: 9.1 mg/dL (ref 8.4–10.5)
Chloride: 102 meq/L (ref 96–112)
Creatinine, Ser: 0.81 mg/dL (ref 0.40–1.20)
GFR: 74.77 mL/min (ref >60.00)
Glucose, Bld: 127 mg/dL — ABNORMAL HIGH (ref 70–99)
Potassium: 4.4 meq/L (ref 3.5–5.1)
Sodium: 139 meq/L (ref 135–145)
Total Bilirubin: 0.8 mg/dL (ref 0.2–1.2)
Total Protein: 7 g/dL (ref 6.0–8.3)

## 2023-07-07 LAB — POCT GLYCOSYLATED HEMOGLOBIN (HGB A1C)
HbA1c POC (<> result, manual entry): 6.7 % (ref 4.0–5.6)
HbA1c, POC (controlled diabetic range): 6.7 % (ref 0.0–7.0)
HbA1c, POC (prediabetic range): 6.7 % — AB (ref 5.7–6.4)
Hemoglobin A1C: 6.7 % — AB (ref 4.0–5.6)

## 2023-07-07 MED ORDER — MONTELUKAST SODIUM 10 MG PO TABS: 10.0000 mg | ORAL_TABLET | Freq: Every day | ORAL | 11 refills | Status: DC

## 2023-07-07 MED ORDER — METHOCARBAMOL 500 MG PO TABS: ORAL_TABLET | ORAL | 1 refills | Status: DC

## 2023-07-07 MED ORDER — LEVOTHYROXINE SODIUM 125 MCG PO TABS: 125.0000 ug | ORAL_TABLET | Freq: Every day | ORAL | 1 refills | Status: DC

## 2023-07-07 MED ORDER — LISINOPRIL 2.5 MG PO TABS: 2.5000 mg | ORAL_TABLET | Freq: Every day | ORAL | 3 refills | Status: DC

## 2023-07-07 NOTE — Telephone Encounter (Signed)
I wanted to confirm the type of appointment Claudia [name] needed in three months? She has seem to have a couple Physical schedules recently. Her appointment type today was a physical? I want to make sure I schedule this correctly

## 2023-07-07 NOTE — Telephone Encounter (Signed)
Pt was here today for 3 month follow-up according to provider's note. Please schedule for physical in 3 months.
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
## 2023-07-08 LAB — MICROALBUMIN / CREATININE URINE RATIO
Creatinine,U: 61 mg/dL
Microalb Creat Ratio: 1.1 mg/g (ref 0.0–30.0)
Microalb, Ur: <0.7 mg/dL (ref 0.0–1.9)

## 2023-07-09 DIAGNOSIS — M25511 Pain in right shoulder: Secondary | ICD-10-CM | POA: Diagnosis not present

## 2023-07-19 ENCOUNTER — Other Ambulatory Visit: Payer: Self-pay | Admitting: Family Medicine

## 2023-07-21 NOTE — Telephone Encounter (Signed)
Patient called to request medication refill for HYDROcodone-acetaminophen (NORCO/VICODIN) 5-325 MG tablet  and a couple other medications. She is unsure exactly what all medications are do for refills at the time of the call. She also mentioned Dr Milinda Cave stated he would call in a anti fungal powder that they discussed at her last appointment. Pharmacy is confirmed as [facility].

## 2023-07-21 NOTE — Telephone Encounter (Signed)
Patient checking on status. Patient states meds should have been called in at list visit.  11/25  Pharmacy has sent requests also but no contact from our office. Please follow up with patient [phone]

## 2023-07-25 ENCOUNTER — Ambulatory Visit (INDEPENDENT_AMBULATORY_CARE_PROVIDER_SITE_OTHER): Payer: Medicare HMO | Admitting: Family Medicine

## 2023-07-25 ENCOUNTER — Encounter: Payer: Self-pay | Admitting: Family Medicine

## 2023-07-25 VITALS — BP 101/56 | HR 67 | Temp 97.6°F | Wt 237.2 lb

## 2023-07-25 DIAGNOSIS — J0100 Acute maxillary sinusitis, unspecified: Secondary | ICD-10-CM

## 2023-07-25 MED ORDER — AMOXICILLIN 875 MG PO TABS: 875.0000 mg | ORAL_TABLET | Freq: Two times a day (BID) | ORAL | 0 refills | Status: AC
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
## 2023-08-18 ENCOUNTER — Other Ambulatory Visit: Payer: Self-pay | Admitting: Family Medicine

## 2023-08-18 ENCOUNTER — Encounter: Payer: Self-pay | Admitting: Family Medicine

## 2023-08-18 ENCOUNTER — Ambulatory Visit (INDEPENDENT_AMBULATORY_CARE_PROVIDER_SITE_OTHER): Payer: Medicare HMO | Admitting: Family Medicine

## 2023-08-18 VITALS — BP 125/73 | HR 81 | Temp 97.9°F | Wt 237.8 lb

## 2023-08-18 DIAGNOSIS — J209 Acute bronchitis, unspecified: Secondary | ICD-10-CM

## 2023-08-18 DIAGNOSIS — J0100 Acute maxillary sinusitis, unspecified: Secondary | ICD-10-CM | POA: Diagnosis not present

## 2023-08-18 MED ORDER — DOXYCYCLINE HYCLATE 100 MG PO CAPS: 100.0000 mg | ORAL_CAPSULE | Freq: Two times a day (BID) | ORAL | 0 refills | Status: AC

## 2023-08-18 MED ORDER — BENZONATATE 100 MG PO CAPS: 200.0000 mg | ORAL_CAPSULE | Freq: Three times a day (TID) | ORAL | 0 refills | Status: DC

## 2023-08-18 MED ORDER — ALBUTEROL SULFATE HFA 108 (90 BASE) MCG/ACT IN AERS: 2.0000 | INHALATION_SPRAY | Freq: Four times a day (QID) | RESPIRATORY_TRACT | 0 refills | Status: AC | PRN

## 2023-08-18 MED ORDER — PREDNISONE 20 MG PO TABS: ORAL_TABLET | ORAL | 0 refills | Status: DC
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
## 2023-08-19 NOTE — Telephone Encounter (Signed)
 Refill requested for Hydrocodone sent to [facility]. Next OV 10/17/23

## 2023-08-25 ENCOUNTER — Other Ambulatory Visit: Payer: Self-pay | Admitting: Family Medicine

## 2023-08-26 ENCOUNTER — Other Ambulatory Visit: Payer: Self-pay | Admitting: Family Medicine

## 2023-09-01 ENCOUNTER — Other Ambulatory Visit: Payer: Self-pay | Admitting: Family Medicine

## 2023-09-02 ENCOUNTER — Encounter: Payer: Self-pay | Admitting: Dermatology

## 2023-09-02 ENCOUNTER — Ambulatory Visit (INDEPENDENT_AMBULATORY_CARE_PROVIDER_SITE_OTHER): Payer: Medicare HMO | Admitting: Dermatology

## 2023-09-02 DIAGNOSIS — Z79899 Other long term (current) drug therapy: Secondary | ICD-10-CM

## 2023-09-02 DIAGNOSIS — L570 Actinic keratosis: Secondary | ICD-10-CM

## 2023-09-02 DIAGNOSIS — L649 Androgenic alopecia, unspecified: Secondary | ICD-10-CM | POA: Diagnosis not present

## 2023-09-02 NOTE — Patient Instructions (Addendum)
Hello Claudia [name],  Thank you for visiting [place] today. We appreciate your commitment to improving your health and are pleased to see the progress you've made. Here is a summary of the key instructions from today's consultation:  Prescription Drops: Continue using the prescription drops daily for your hair loss.  Supplements: Keep taking Viviscal supplements and start incorporating collagen powder into your routine.  Cryotherapy Treatment: We performed cryotherapy with liquid nitrogen on the actinic keratosis on your left lower leg and left arm to prevent progression to skin cancer.  Follow-Up: A follow-up appointment is scheduled for May or June, depending on availability, to monitor your hair loss and skin condition.   Thank you once again for your visit, and we look forward to seeing you in a few months to continue monitoring your progress.  Best regards,  Dr. [facility]     Cryotherapy Aftercare  Wash gently with soap and water everyday.   Apply Vaseline and Band-Aid daily until healed.    Important Information  Due to recent changes in healthcare laws, you may see results of your pathology and/or laboratory studies on MyChart before the doctors have had a chance to review them. We understand that in some cases there may be results that are confusing or concerning to you. Please understand that not all results are received at the same time and often the doctors may need to interpret multiple results in order to provide you with the best plan of care or course of treatment. Therefore, we ask that you please give [place] 2 business days to thoroughly review all your results before contacting the office for clarification. Should we see a critical lab result, you will be contacted sooner.   If You Need Anything After Your Visit  If you have any questions or concerns for your doctor, please call our main line at [phone] If no one answers, please leave a voicemail as  directed and we will return your call as soon as possible. Messages left after 4 pm will be answered the following business day.   You may also send [place] a message via MyChart. We typically respond to MyChart messages within 1-2 business days.  For prescription refills, please ask your pharmacy to contact our office. Our fax number is [phone].  If you have an urgent issue when the clinic is closed that cannot wait until the next business day, you can page your doctor at the number below.    Please note that while we do our best to be available for urgent issues outside of office hours, we are not available 24/7.   If you have an urgent issue and are unable to reach [place], you may choose to seek medical care at your doctor's office, retail clinic, urgent care center, or emergency room.  If you have a medical emergency, please immediately call 911 or go to the emergency department. In the event of inclement weather, please call our main line at [phone] for an update on the status of any delays or closures.  Dermatology Medication Tips: Please keep the boxes that topical medications come in in order to help keep track of the instructions about where and how to use these. Pharmacies typically print the medication instructions only on the boxes and not directly on the medication tubes.   If your medication is too expensive, please contact our office at [phone] or send [place] a message through MyChart.   We are unable to tell what your co-pay  for medications will be in advance as this is different depending on your insurance coverage. However, we may be able to find a substitute medication at lower cost or fill out paperwork to get insurance to cover a needed medication.   If a prior authorization is required to get your medication covered by your insurance company, please allow [place] 1-2 business days to complete this process.  Drug prices often vary depending on where the prescription is  filled and some pharmacies may offer cheaper prices.  The website www.goodrx.com contains coupons for medications through different pharmacies. The prices here do not account for what the cost may be with help from insurance (it may be cheaper with your insurance), but the website can give you the price if you did not use any insurance.  - You can print the associated coupon and take it with your prescription to the pharmacy.  - You may also stop by our office during regular business hours and pick up a GoodRx coupon card.  - If you need your prescription sent electronically to a different pharmacy, notify our office through [facility] or by phone at [phone]

## 2023-09-02 NOTE — Progress Notes (Signed)
   Follow-Up Visit   Subjective  Claudia [name] is a 68 y.o. female who presents for the following: androgenetic alopecia  Patient present today for follow up visit. Patient was last evaluated on 07/01/23. Patient reports sxs are unchanged due to not starting the OTC recommendations until the beginning of the month because of product cost. Patient denies medication changes.  The following portions of the chart were reviewed this encounter and updated as appropriate: medications, allergies, medical history  Review of Systems:  No other skin or systemic complaints except as noted in HPI or Assessment and Plan.  Objective  Well appearing patient in no apparent distress; mood and affect are within normal limits.   A focused examination was performed of the following areas: scalp   Relevant exam findings are noted in the Assessment and Plan.            Left Forearm - Anterior (2), Left Lower Leg - Anterior Erythematous thin papules/macules with gritty scale.   Assessment & Plan   Assessment: Patient reports improvement in hair loss treatment initiated approximately 3-4 months ago, after starting prescription drops post-Christmas. Visual comparison indicates hair filling in, showing the treatment's effectiveness.  Plan: Continue prescription Minoxidil/Finasteride every morning. Continue taking of Viviscal supplements. Begin using collagen powder. Schedule a follow-up appointment in 4 months (May or June 2025).  Long term medication management.  Patient is using long term (months to years) prescription medication  to control their dermatologic condition.  These medications require periodic monitoring to evaluate for efficacy and side effects and may require periodic laboratory monitoring.    ACTINIC KERATOSIS Exam: Erythematous thin papules/macules with gritty scale at the lower left leg  Assessment: Patient has a history of a recent bx confirming hypertrophic actinic keratosis on  left lower leg. Physical examination reveals a scar from a recent biopsy on the left lower leg and a pink keratotic papule at the same site. A small residual actinic keratosis with some scale is noted on the left arm.  Plan: Perform cryotherapy with liquid nitrogen on the lesion on the left lower leg. Apply light cryotherapy on the left arm lesion.   Recommend daily broad spectrum sunscreen SPF 30+ to sun-exposed areas, reapply every 2 hours as needed.  Recommend staying in the shade or wearing long sleeves, sun glasses (UVA+UVB protection) and wide brim hats (4-inch brim around the entire circumference of the hat). Call for new or changing lesions.    AK (ACTINIC KERATOSIS) (3) Left Forearm - Anterior (2), Left Lower Leg - Anterior Destruction of lesion - Left Forearm - Anterior (2), Left Lower Leg - Anterior Complexity: simple   Destruction method: cryotherapy   Informed consent: discussed and consent obtained   Timeout:  patient name, date of birth, surgical site, and procedure verified Lesion destroyed using liquid nitrogen: Yes   Region frozen until ice ball extended beyond lesion: Yes   Outcome: patient tolerated procedure well with no complications   Post-procedure details: wound care instructions given    No follow-ups on file.    Documentation: I have reviewed the above documentation for accuracy and completeness, and I agree with the above.   I, Shirron Marcha Solders, CMA, am acting as scribe for [employer], DO.   Langston Reusing, DO

## 2023-09-16 ENCOUNTER — Telehealth: Payer: Self-pay | Admitting: Family Medicine

## 2023-09-16 NOTE — Telephone Encounter (Signed)
 Copied from CRM [id number]. Topic: Clinical - Medical Advice >> Sep 16, 2023  8:15 AM Gerardine PARAS wrote: Reason for CRM: Patient called in regarding getting scheduled, after checking availability patient then stated time would not work and to see if Dr.McGowen could just prescribe another round of antibiotics for  bronchitis- no new or worsening symptoms

## 2023-09-17 NOTE — Telephone Encounter (Signed)
 Tell her I am sorry but it has been a month since I saw her last for these symptoms. See if she has an ideal time for appointment and let me know and I will work her in as a virtual or in person (prefer in person)

## 2023-09-17 NOTE — Telephone Encounter (Signed)
 Called and spoke with pt. Offered pt the available appointment times for this week. Pt stated she was unable to make any of the offered times due to work. Pt stated she would call back Monday if she was not feeling any better.

## 2023-09-20 ENCOUNTER — Other Ambulatory Visit: Payer: Self-pay | Admitting: Family Medicine

## 2023-09-22 ENCOUNTER — Telehealth: Payer: Self-pay | Admitting: Family Medicine

## 2023-09-22 MED ORDER — HYDROCODONE-ACETAMINOPHEN 5-325 MG PO TABS: ORAL_TABLET | ORAL | 0 refills | Status: DC

## 2023-09-22 NOTE — Telephone Encounter (Signed)
 Prescription sent

## 2023-09-22 NOTE — Telephone Encounter (Signed)
 Refill requested for Hydrocodone -acetaminophen  sent to [facility] and home care. Next OV is 10/17/23

## 2023-09-22 NOTE — Addendum Note (Signed)
 Addended by: Shelvia Dick on: 09/22/2023 04:58 PM   Modules accepted: Orders

## 2023-09-22 NOTE — Telephone Encounter (Signed)
 Copied from CRM [id number]. Topic: Clinical - Medication Refill >> Sep 22, 2023 10:20 AM Crist Dominion wrote: Most Recent Primary Care Visit:  Provider: Shelvia Dick  Department: [facility]  Visit Type: ACUTE  Date: 08/18/2023  Medication: HYDROcodone -acetaminophen  (NORCO/VICODIN) 5-325 MG tablet   Has the patient contacted their pharmacy? Yes, no update on re-fill request (Agent: If no, request that the patient contact the pharmacy for the refill. If patient does not wish to contact the pharmacy document the reason why and proceed with request.) (Agent: If yes, when and what did the pharmacy advise?)  Is this the correct pharmacy for this prescription? Yes If no, delete pharmacy and type the correct one.  This is the patient's preferred pharmacy:  [facility] [place], [place] - [address] [place] [address] Phone: [phone] Fax: [phone]     Has the prescription been filled recently? yes  Is the patient out of the medication? Yes  Has the patient been seen for an appointment in the last year OR does the patient have an upcoming appointment? Yes  Can we respond through MyChart? Yes  Agent: Please be advised that Rx refills may take up to 3 business days. We ask that you follow-up with your pharmacy.

## 2023-10-01 ENCOUNTER — Ambulatory Visit: Payer: Medicare HMO | Admitting: Dermatology

## 2023-10-07 ENCOUNTER — Other Ambulatory Visit: Payer: Self-pay | Admitting: Family Medicine

## 2023-10-08 DIAGNOSIS — M25511 Pain in right shoulder: Secondary | ICD-10-CM | POA: Diagnosis not present

## 2023-10-16 ENCOUNTER — Other Ambulatory Visit: Payer: Self-pay | Admitting: Family Medicine

## 2023-10-16 NOTE — Telephone Encounter (Signed)
 Refills requested for hydrocodone and tramadol sent to [facility]. Next OV 3/18

## 2023-10-17 ENCOUNTER — Ambulatory Visit: Payer: Medicare HMO | Admitting: Family Medicine

## 2023-10-23 ENCOUNTER — Other Ambulatory Visit: Payer: Self-pay

## 2023-10-23 NOTE — Telephone Encounter (Signed)
 Pt called to request refills for Ozempic, Tramadol and Hydrocodone. Reviewed pt's chart and advised these medications have refills remaining or recently sent to the pharmacy on 3/6.

## 2023-10-28 ENCOUNTER — Ambulatory Visit (INDEPENDENT_AMBULATORY_CARE_PROVIDER_SITE_OTHER): Payer: Medicare HMO | Admitting: Family Medicine

## 2023-10-28 ENCOUNTER — Encounter: Payer: Self-pay | Admitting: Family Medicine

## 2023-10-28 VITALS — BP 91/51 | HR 67 | Temp 98.1°F | Ht 65.0 in | Wt 235.4 lb

## 2023-10-28 DIAGNOSIS — E039 Hypothyroidism, unspecified: Secondary | ICD-10-CM

## 2023-10-28 DIAGNOSIS — I10 Essential (primary) hypertension: Secondary | ICD-10-CM

## 2023-10-28 DIAGNOSIS — Z0000 Encounter for general adult medical examination without abnormal findings: Secondary | ICD-10-CM | POA: Diagnosis not present

## 2023-10-28 DIAGNOSIS — E119 Type 2 diabetes mellitus without complications: Secondary | ICD-10-CM | POA: Diagnosis not present

## 2023-10-28 DIAGNOSIS — Z7984 Long term (current) use of oral hypoglycemic drugs: Secondary | ICD-10-CM | POA: Diagnosis not present

## 2023-10-28 DIAGNOSIS — E7800 Pure hypercholesterolemia, unspecified: Secondary | ICD-10-CM | POA: Diagnosis not present

## 2023-10-28 LAB — CBC WITH DIFFERENTIAL/PLATELET
Basophils Absolute: 0.1 10*3/uL (ref 0.0–0.1)
Basophils Relative: 0.9 % (ref 0.0–3.0)
Eosinophils Absolute: 0.1 10*3/uL (ref 0.0–0.7)
Eosinophils Relative: 0.7 % (ref 0.0–5.0)
HCT: 42 % (ref 36.0–46.0)
Hemoglobin: 14.1 g/dL (ref 12.0–15.0)
Lymphocytes Relative: 14.3 % (ref 12.0–46.0)
Lymphs Abs: 1.3 10*3/uL (ref 0.7–4.0)
MCHC: 33.6 g/dL (ref 30.0–36.0)
MCV: 91.3 fl (ref 78.0–100.0)
Monocytes Absolute: 0.6 10*3/uL (ref 0.1–1.0)
Monocytes Relative: 6.9 % (ref 3.0–12.0)
Neutro Abs: 6.9 10*3/uL (ref 1.4–7.7)
Neutrophils Relative %: 77.2 % — ABNORMAL HIGH (ref 43.0–77.0)
Platelets: 216 10*3/uL (ref 150.0–400.0)
RBC: 4.6 Mil/uL (ref 3.87–5.11)
RDW: 15.6 % — ABNORMAL HIGH (ref 11.5–15.5)
WBC: 8.9 10*3/uL (ref 4.0–10.5)

## 2023-10-28 LAB — COMPREHENSIVE METABOLIC PANEL
ALT: 15 U/L (ref 0–35)
AST: 14 U/L (ref 0–37)
Albumin: 4 g/dL (ref 3.5–5.2)
Alkaline Phosphatase: 140 U/L — ABNORMAL HIGH (ref 39–117)
BUN: 10 mg/dL (ref 6–23)
CO2: 28 meq/L (ref 19–32)
Calcium: 9 mg/dL (ref 8.4–10.5)
Chloride: 99 meq/L (ref 96–112)
Creatinine, Ser: 0.84 mg/dL (ref 0.40–1.20)
GFR: 71.42 mL/min (ref >60.00)
Glucose, Bld: 207 mg/dL — ABNORMAL HIGH (ref 70–99)
Potassium: 4.9 meq/L (ref 3.5–5.1)
Sodium: 134 meq/L — ABNORMAL LOW (ref 135–145)
Total Bilirubin: 1 mg/dL (ref 0.2–1.2)
Total Protein: 6.7 g/dL (ref 6.0–8.3)

## 2023-10-28 LAB — POCT GLYCOSYLATED HEMOGLOBIN (HGB A1C)
HbA1c POC (<> result, manual entry): 9.3 % (ref 4.0–5.6)
HbA1c, POC (controlled diabetic range): 9.3 % — AB (ref 0.0–7.0)
HbA1c, POC (prediabetic range): 9.3 % — AB (ref 5.7–6.4)
Hemoglobin A1C: 9.3 % — AB (ref 4.0–5.6)

## 2023-10-28 LAB — LIPID PANEL
Cholesterol: 154 mg/dL (ref 0–200)
HDL: 65.2 mg/dL (ref >39.00)
LDL Cholesterol: 63 mg/dL (ref 0–99)
NonHDL: 88.97
Total CHOL/HDL Ratio: 2
Triglycerides: 130 mg/dL (ref 0.0–149.0)
VLDL: 26 mg/dL (ref 0.0–40.0)

## 2023-10-28 LAB — TSH: TSH: 6.73 u[IU]/mL — ABNORMAL HIGH (ref 0.35–5.50)

## 2023-10-28 MED ORDER — LEVOTHYROXINE SODIUM 125 MCG PO TABS: 125.0000 ug | ORAL_TABLET | Freq: Every day | ORAL | 1 refills | Status: DC

## 2023-10-28 MED ORDER — CANAGLIFLOZIN 300 MG PO TABS
300.0000 mg | ORAL_TABLET | Freq: Every day | ORAL | 1 refills | Status: DC
Start: 2023-10-28 — End: 2024-04-29
  Filled 2024-04-06: qty 30, 30d supply, fill #0

## 2023-10-28 MED ORDER — ACCU-CHEK GUIDE TEST VI STRP: ORAL_STRIP | 12 refills | Status: AC

## 2023-10-28 MED ORDER — ALENDRONATE SODIUM 70 MG PO TABS: ORAL_TABLET | ORAL | 1 refills | Status: DC
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
## 2023-11-12 ENCOUNTER — Other Ambulatory Visit (HOSPITAL_COMMUNITY): Payer: Self-pay

## 2023-11-19 ENCOUNTER — Other Ambulatory Visit (HOSPITAL_COMMUNITY): Payer: Self-pay

## 2023-11-19 ENCOUNTER — Other Ambulatory Visit: Payer: Self-pay | Admitting: Family Medicine

## 2023-11-19 NOTE — Telephone Encounter (Signed)
 Requesting: Hydrocodone Contract: 12/23/22 UDS: 04/04/23 Last Visit: 10/28/23 Next Visit: 01/27/24 Last Refill: 10/16/23 (90,0)  Please Advise. Med pending

## 2023-11-19 NOTE — Telephone Encounter (Signed)
Pt advised refill sent. °

## 2023-12-04 DIAGNOSIS — Z85850 Personal history of malignant neoplasm of thyroid: Secondary | ICD-10-CM | POA: Diagnosis not present

## 2023-12-04 DIAGNOSIS — E890 Postprocedural hypothyroidism: Secondary | ICD-10-CM | POA: Diagnosis not present

## 2023-12-17 ENCOUNTER — Other Ambulatory Visit: Payer: Self-pay | Admitting: Family Medicine

## 2023-12-17 NOTE — Telephone Encounter (Signed)
 Refill requested to Hydrocodone  sent to [facility]. Next OV 6/17

## 2023-12-18 ENCOUNTER — Other Ambulatory Visit: Payer: Self-pay | Admitting: Family Medicine

## 2023-12-18 DIAGNOSIS — E119 Type 2 diabetes mellitus without complications: Secondary | ICD-10-CM

## 2023-12-26 ENCOUNTER — Other Ambulatory Visit: Payer: Self-pay | Admitting: Family Medicine

## 2024-01-06 DIAGNOSIS — M25511 Pain in right shoulder: Secondary | ICD-10-CM | POA: Diagnosis not present

## 2024-01-16 ENCOUNTER — Other Ambulatory Visit: Payer: Self-pay | Admitting: Family Medicine

## 2024-01-19 ENCOUNTER — Other Ambulatory Visit: Payer: Self-pay | Admitting: Family Medicine

## 2024-01-19 NOTE — Telephone Encounter (Signed)
 Refill requested for Hydrocodone .  Next OV 6/17

## 2024-01-27 ENCOUNTER — Ambulatory Visit: Admitting: Family Medicine
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
## 2024-01-29 ENCOUNTER — Ambulatory Visit: Payer: Medicare HMO | Admitting: Dermatology

## 2024-01-29 ENCOUNTER — Ambulatory Visit: Admitting: Family Medicine

## 2024-01-29 ENCOUNTER — Encounter: Payer: Self-pay | Admitting: Family Medicine

## 2024-01-29 VITALS — BP 78/49 | HR 73 | Ht 65.0 in | Wt 234.6 lb

## 2024-01-29 DIAGNOSIS — Z7984 Long term (current) use of oral hypoglycemic drugs: Secondary | ICD-10-CM | POA: Diagnosis not present

## 2024-01-29 DIAGNOSIS — I952 Hypotension due to drugs: Secondary | ICD-10-CM

## 2024-01-29 DIAGNOSIS — I10 Essential (primary) hypertension: Secondary | ICD-10-CM

## 2024-01-29 DIAGNOSIS — M79671 Pain in right foot: Secondary | ICD-10-CM

## 2024-01-29 DIAGNOSIS — G894 Chronic pain syndrome: Secondary | ICD-10-CM

## 2024-01-29 DIAGNOSIS — Z79899 Other long term (current) drug therapy: Secondary | ICD-10-CM

## 2024-01-29 DIAGNOSIS — M25561 Pain in right knee: Secondary | ICD-10-CM | POA: Diagnosis not present

## 2024-01-29 DIAGNOSIS — M25562 Pain in left knee: Secondary | ICD-10-CM

## 2024-01-29 DIAGNOSIS — E890 Postprocedural hypothyroidism: Secondary | ICD-10-CM

## 2024-01-29 DIAGNOSIS — M545 Low back pain, unspecified: Secondary | ICD-10-CM | POA: Diagnosis not present

## 2024-01-29 DIAGNOSIS — E119 Type 2 diabetes mellitus without complications: Secondary | ICD-10-CM

## 2024-01-29 DIAGNOSIS — G8929 Other chronic pain: Secondary | ICD-10-CM

## 2024-01-29 LAB — POCT GLYCOSYLATED HEMOGLOBIN (HGB A1C)
HbA1c POC (<> result, manual entry): 8.1 % (ref 4.0–5.6)
HbA1c, POC (controlled diabetic range): 8.1 % — AB (ref 0.0–7.0)
HbA1c, POC (prediabetic range): 8.1 % — AB (ref 5.7–6.4)
Hemoglobin A1C: 8.1 % — AB (ref 4.0–5.6)

## 2024-01-29 LAB — MICROALBUMIN / CREATININE URINE RATIO
Creatinine,U: 52.4 mg/dL
Microalb Creat Ratio: 16.2 mg/g (ref 0.0–30.0)
Microalb, Ur: 0.8 mg/dL (ref 0.0–1.9)

## 2024-01-29 LAB — BASIC METABOLIC PANEL WITH GFR
BUN: 15 mg/dL (ref 6–23)
CO2: 28 meq/L (ref 19–32)
Calcium: 8.9 mg/dL (ref 8.4–10.5)
Chloride: 103 meq/L (ref 96–112)
Creatinine, Ser: 0.89 mg/dL (ref 0.40–1.20)
GFR: 66.52 mL/min (ref >60.00)
Glucose, Bld: 185 mg/dL — ABNORMAL HIGH (ref 70–99)
Potassium: 4.8 meq/L (ref 3.5–5.1)
Sodium: 137 meq/L (ref 135–145)

## 2024-01-29 LAB — TSH: TSH: 1.2 u[IU]/mL (ref 0.35–5.50)
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
## 2024-01-29 NOTE — Patient Instructions (Signed)
 Stop lisinopril.

## 2024-01-30 ENCOUNTER — Ambulatory Visit: Payer: Self-pay | Admitting: Family Medicine

## 2024-02-09 ENCOUNTER — Other Ambulatory Visit: Payer: Self-pay | Admitting: Family Medicine

## 2024-02-16 ENCOUNTER — Other Ambulatory Visit: Payer: Self-pay | Admitting: Family Medicine

## 2024-02-16 NOTE — Telephone Encounter (Signed)
 Requesting:Hydrocodone  Contract: 12/23/22 UDS: 04/04/23 Last Visit: 01/29/24 Next Visit: 04/30/24 Last Refill: 01/19/24 (90,0)  Please Advise. Rx pending

## 2024-02-26 ENCOUNTER — Other Ambulatory Visit: Payer: Self-pay | Admitting: Family Medicine

## 2024-03-19 ENCOUNTER — Other Ambulatory Visit: Payer: Self-pay | Admitting: Family Medicine

## 2024-03-19 NOTE — Telephone Encounter (Signed)
 Requesting: Hydrocodone   Contract: 12/23/22 UDS: 04/04/23 Last Visit: 01/29/24 Next Visit: 04/30/24 Last Refill: 02/16/24 (90,0)  Please Advise. Rx pending

## 2024-04-06 ENCOUNTER — Other Ambulatory Visit (HOSPITAL_BASED_OUTPATIENT_CLINIC_OR_DEPARTMENT_OTHER): Payer: Self-pay

## 2024-04-06 ENCOUNTER — Other Ambulatory Visit: Payer: Self-pay | Admitting: Family Medicine

## 2024-04-06 MED FILL — Semaglutide Soln Pen-inj 0.25 or 0.5 MG/DOSE (2 MG/3ML): SUBCUTANEOUS | 28 days supply | Qty: 3 | Fill #0 | Status: AC

## 2024-04-07 ENCOUNTER — Other Ambulatory Visit: Payer: Self-pay | Admitting: Family Medicine

## 2024-04-07 DIAGNOSIS — M65351 Trigger finger, right little finger: Secondary | ICD-10-CM | POA: Diagnosis not present

## 2024-04-07 DIAGNOSIS — M25511 Pain in right shoulder: Secondary | ICD-10-CM | POA: Diagnosis not present

## 2024-04-13 ENCOUNTER — Other Ambulatory Visit: Payer: Self-pay | Admitting: Family Medicine

## 2024-04-19 ENCOUNTER — Other Ambulatory Visit: Payer: Self-pay | Admitting: Family Medicine

## 2024-04-19 NOTE — Telephone Encounter (Signed)
 Requesting: Norco Contract: 12/23/22 UDS: 04/04/23 Last Visit: 01/29/24 Next Visit: 04/30/24 Last Refill: 03/19/24 (90,0)  Please Advise. Rx pending

## 2024-04-19 NOTE — Telephone Encounter (Signed)
 Requesting: tramadol  Contract: 12/23/22 UDS: 04/04/23 Last Visit: 01/29/24 Next Visit: 04/30/24 Last Refill: 10/16/23 (90,5)  Please Advise. Rx pending

## 2024-04-20 ENCOUNTER — Telehealth: Payer: Self-pay

## 2024-04-20 MED ORDER — FLUCONAZOLE 150 MG PO TABS: 150.0000 mg | ORAL_TABLET | Freq: Every day | ORAL | 1 refills | Status: DC

## 2024-04-20 NOTE — Addendum Note (Signed)
 Addended by: CANDISE ALEENE DEL on: 04/20/2024 08:40 PM   Modules accepted: Orders

## 2024-04-20 NOTE — Telephone Encounter (Signed)
 Copied from CRM [id number]. Topic: Clinical - Medication Question >> Apr 20, 2024 11:47 AM Burnard DEL wrote: Reason for CRM: Patient would like to know if she could have a refill on fluconazole  (DIFLUCAN ) 150 MG tablet?She stated that she get yeast infections often,and lately she has been going to the gym and is now experiencing it in the fold of her legs.  [facility] [place], [place] - [name]  Phone: [phone] Fax: [phone]

## 2024-04-20 NOTE — Telephone Encounter (Signed)
 Rx sent.

## 2024-04-21 NOTE — Telephone Encounter (Signed)
Spoke with pt and advised refill sent to pharmacy

## 2024-04-30 ENCOUNTER — Ambulatory Visit (INDEPENDENT_AMBULATORY_CARE_PROVIDER_SITE_OTHER): Admitting: Family Medicine

## 2024-04-30 ENCOUNTER — Encounter: Payer: Self-pay | Admitting: Family Medicine

## 2024-04-30 ENCOUNTER — Other Ambulatory Visit (HOSPITAL_COMMUNITY): Payer: Self-pay

## 2024-04-30 VITALS — BP 98/64 | HR 71 | Temp 97.8°F | Ht 65.0 in | Wt 237.0 lb

## 2024-04-30 DIAGNOSIS — Z79899 Other long term (current) drug therapy: Secondary | ICD-10-CM

## 2024-04-30 DIAGNOSIS — E7800 Pure hypercholesterolemia, unspecified: Secondary | ICD-10-CM | POA: Diagnosis not present

## 2024-04-30 DIAGNOSIS — Z7985 Long-term (current) use of injectable non-insulin antidiabetic drugs: Secondary | ICD-10-CM

## 2024-04-30 DIAGNOSIS — E119 Type 2 diabetes mellitus without complications: Secondary | ICD-10-CM

## 2024-04-30 DIAGNOSIS — G894 Chronic pain syndrome: Secondary | ICD-10-CM

## 2024-04-30 DIAGNOSIS — Z7984 Long term (current) use of oral hypoglycemic drugs: Secondary | ICD-10-CM | POA: Diagnosis not present

## 2024-04-30 LAB — COMPREHENSIVE METABOLIC PANEL WITH GFR
ALT: 14 U/L (ref 0–35)
AST: 14 U/L (ref 0–37)
Albumin: 4 g/dL (ref 3.5–5.2)
Alkaline Phosphatase: 146 U/L — ABNORMAL HIGH (ref 39–117)
BUN: 11 mg/dL (ref 6–23)
CO2: 28 meq/L (ref 19–32)
Calcium: 9.2 mg/dL (ref 8.4–10.5)
Chloride: 100 meq/L (ref 96–112)
Creatinine, Ser: 0.83 mg/dL (ref 0.40–1.20)
GFR: 72.2 mL/min (ref >60.00)
Glucose, Bld: 155 mg/dL — ABNORMAL HIGH (ref 70–99)
Potassium: 4.8 meq/L (ref 3.5–5.1)
Sodium: 135 meq/L (ref 135–145)
Total Bilirubin: 0.9 mg/dL (ref 0.2–1.2)
Total Protein: 6.7 g/dL (ref 6.0–8.3)

## 2024-04-30 LAB — POCT GLYCOSYLATED HEMOGLOBIN (HGB A1C)
HbA1c POC (<> result, manual entry): 7.7 % (ref 4.0–5.6)
HbA1c, POC (controlled diabetic range): 7.7 % — AB (ref 0.0–7.0)
HbA1c, POC (prediabetic range): 7.7 % — AB (ref 5.7–6.4)
Hemoglobin A1C: 7.7 % — AB (ref 4.0–5.6)

## 2024-04-30 LAB — LIPID PANEL
Cholesterol: 135 mg/dL (ref 0–200)
HDL: 52.3 mg/dL (ref >39.00)
LDL Cholesterol: 61 mg/dL (ref 0–99)
NonHDL: 82.22
Total CHOL/HDL Ratio: 3
Triglycerides: 106 mg/dL (ref 0.0–149.0)
VLDL: 21.2 mg/dL (ref 0.0–40.0)

## 2024-04-30 MED ORDER — GABAPENTIN 100 MG PO CAPS: 100.0000 mg | ORAL_CAPSULE | Freq: Every day | ORAL | 3 refills | Status: AC

## 2024-04-30 MED ORDER — ONDANSETRON HCL 4 MG PO TABS: ORAL_TABLET | ORAL | 1 refills | Status: AC

## 2024-04-30 MED ORDER — GLIPIZIDE 10 MG PO TABS: 10.0000 mg | ORAL_TABLET | Freq: Two times a day (BID) | ORAL | 3 refills | Status: AC

## 2024-04-30 MED ORDER — ATORVASTATIN CALCIUM 20 MG PO TABS: 20.0000 mg | ORAL_TABLET | Freq: Every day | ORAL | 3 refills | Status: AC

## 2024-04-30 MED ORDER — LEVOTHYROXINE SODIUM 125 MCG PO TABS: 125.0000 ug | ORAL_TABLET | Freq: Every day | ORAL | 3 refills | Status: AC

## 2024-04-30 MED ORDER — COVID-19 MRNA VAC-TRIS(PFIZER) 30 MCG/0.3ML IM SUSY
0.3000 mL | PREFILLED_SYRINGE | Freq: Once | INTRAMUSCULAR | 0 refills | Status: AC
  Filled 2024-04-30: qty 0.3, 1d supply, fill #0

## 2024-04-30 MED ORDER — INVOKANA 300 MG PO TABS
300.0000 mg | ORAL_TABLET | Freq: Every day | ORAL | 3 refills | Status: DC
  Filled 2024-05-03: qty 90, 90d supply, fill #0
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
## 2024-04-30 NOTE — Patient Instructions (Signed)

## 2024-05-01 ENCOUNTER — Ambulatory Visit: Payer: Self-pay | Admitting: Family Medicine

## 2024-05-01 LAB — DRUG MONITOR, PANEL 1, SCREEN, URINE
Amphetamines: NEGATIVE ng/mL (ref <500)
Barbiturates: NEGATIVE ng/mL (ref <300)
Benzodiazepines: NEGATIVE ng/mL (ref <100)
Cocaine Metabolite: NEGATIVE ng/mL (ref <150)
Creatinine: 59.6 mg/dL (ref >=20.0)
Marijuana Metabolite: NEGATIVE ng/mL (ref <20)
Methadone Metabolite: NEGATIVE ng/mL (ref <100)
Opiates: POSITIVE ng/mL — AB (ref <100)
Oxidant: NEGATIVE ug/mL (ref <200)
Oxycodone: NEGATIVE ng/mL (ref <100)
Phencyclidine: NEGATIVE ng/mL (ref <25)
pH: 5.6 (ref 4.5–9.0)

## 2024-05-01 LAB — DM TEMPLATE

## 2024-05-03 ENCOUNTER — Other Ambulatory Visit (HOSPITAL_BASED_OUTPATIENT_CLINIC_OR_DEPARTMENT_OTHER): Payer: Self-pay

## 2024-05-03 ENCOUNTER — Other Ambulatory Visit: Payer: Self-pay | Admitting: Family Medicine

## 2024-05-03 MED ORDER — COMIRNATY 30 MCG/0.3ML IM SUSY
0.3000 mL | PREFILLED_SYRINGE | Freq: Once | INTRAMUSCULAR | 0 refills | Status: AC
  Filled 2024-05-03: qty 0.3, 1d supply, fill #0

## 2024-05-03 MED ORDER — FLUZONE HIGH-DOSE 0.5 ML IM SUSY
0.5000 mL | PREFILLED_SYRINGE | Freq: Once | INTRAMUSCULAR | 0 refills | Status: AC
  Filled 2024-05-03: qty 0.5, 1d supply, fill #0

## 2024-05-03 MED ORDER — OZEMPIC (0.25 OR 0.5 MG/DOSE) 2 MG/3ML ~~LOC~~ SOPN
0.5000 mg | PEN_INJECTOR | SUBCUTANEOUS | 2 refills | Status: DC
  Filled 2024-05-03: qty 3, 28d supply, fill #0
  Filled 2024-06-04: qty 3, 28d supply, fill #1
  Filled 2024-06-30: qty 3, 28d supply, fill #2

## 2024-05-18 ENCOUNTER — Other Ambulatory Visit: Payer: Self-pay | Admitting: Family Medicine

## 2024-05-18 NOTE — Telephone Encounter (Signed)
 Requesting: Hydrocodone  Contract: 04/30/24 UDS: 04/30/24 Last Visit: 04/30/24 Next Visit: 07/30/24 Last Refill: 04/19/24 (90,0)  Please Advise. Rx pending

## 2024-05-20 ENCOUNTER — Other Ambulatory Visit (HOSPITAL_BASED_OUTPATIENT_CLINIC_OR_DEPARTMENT_OTHER): Payer: Self-pay

## 2024-05-24 DIAGNOSIS — Z85850 Personal history of malignant neoplasm of thyroid: Secondary | ICD-10-CM | POA: Diagnosis not present

## 2024-05-24 DIAGNOSIS — E890 Postprocedural hypothyroidism: Secondary | ICD-10-CM | POA: Diagnosis not present

## 2024-05-28 DIAGNOSIS — Z85850 Personal history of malignant neoplasm of thyroid: Secondary | ICD-10-CM | POA: Diagnosis not present

## 2024-05-28 DIAGNOSIS — E890 Postprocedural hypothyroidism: Secondary | ICD-10-CM | POA: Diagnosis not present

## 2024-06-04 ENCOUNTER — Other Ambulatory Visit (HOSPITAL_BASED_OUTPATIENT_CLINIC_OR_DEPARTMENT_OTHER): Payer: Self-pay

## 2024-06-07 ENCOUNTER — Other Ambulatory Visit: Payer: Self-pay | Admitting: Family Medicine

## 2024-06-14 ENCOUNTER — Other Ambulatory Visit: Payer: Self-pay | Admitting: Family Medicine

## 2024-06-17 ENCOUNTER — Other Ambulatory Visit: Payer: Self-pay | Admitting: Family Medicine

## 2024-06-17 DIAGNOSIS — Z1231 Encounter for screening mammogram for malignant neoplasm of breast: Secondary | ICD-10-CM

## 2024-06-17 NOTE — Telephone Encounter (Signed)
 Requesting: Hydrocodone  Contract: 04/30/24 UDS: 04/30/24 Last Visit: 04/30/24 Next Visit: 07/30/24 Last Refill: 05/18/24 (90,0)  Please Advise. Rx pending

## 2024-06-25 ENCOUNTER — Other Ambulatory Visit: Payer: Self-pay | Admitting: Family Medicine

## 2024-06-28 DIAGNOSIS — M545 Low back pain, unspecified: Secondary | ICD-10-CM | POA: Diagnosis not present

## 2024-06-30 ENCOUNTER — Other Ambulatory Visit (HOSPITAL_BASED_OUTPATIENT_CLINIC_OR_DEPARTMENT_OTHER): Payer: Self-pay

## 2024-06-30 ENCOUNTER — Telehealth: Payer: Self-pay | Admitting: Family Medicine

## 2024-06-30 NOTE — Telephone Encounter (Unsigned)
 Copied from CRM [id number]. Topic: Clinical - Medication Refill >> Jun 30, 2024  1:17 PM Viola F wrote: Medication: new accu-check meter, her current meter is broken   Has the patient contacted their pharmacy? Yes (Agent: If no, request that the patient contact the pharmacy for the refill. If patient does not wish to contact the pharmacy document the reason why and proceed with request.) (Agent: If yes, when and what did the pharmacy advise?)  This is the patient's preferred pharmacy:   [facility] [place], [place] - [address] [place] [address] Phone: [phone] Fax: [phone]  Is this the correct pharmacy for this prescription? Yes If no, delete pharmacy and type the correct one.   Has the prescription been filled recently? Yes  Is the patient out of the medication? Yes  Has the patient been seen for an appointment in the last year OR does the patient have an upcoming appointment? Yes  Can we respond through MyChart? Yes  Agent: Please be advised that Rx refills may take up to 3 business days. We ask that you follow-up with your pharmacy.

## 2024-07-01 MED ORDER — ACCU-CHEK GUIDE W/DEVICE KIT: PACK | 0 refills | Status: AC

## 2024-07-13 ENCOUNTER — Other Ambulatory Visit: Payer: Self-pay | Admitting: Family Medicine

## 2024-07-13 DIAGNOSIS — M25511 Pain in right shoulder: Secondary | ICD-10-CM | POA: Diagnosis not present

## 2024-07-13 DIAGNOSIS — M65351 Trigger finger, right little finger: Secondary | ICD-10-CM | POA: Diagnosis not present

## 2024-07-15 ENCOUNTER — Other Ambulatory Visit: Payer: Self-pay | Admitting: Family Medicine

## 2024-07-15 NOTE — Telephone Encounter (Signed)
 Requesting: hydrocodone  Contract: 04/30/24 UDS: 04/30/24 Last Visit: 04/30/24 Next Visit: 07/30/24 Last Refill: 06/17/24 (90,0)  Please Advise. Rx pending

## 2024-07-16 NOTE — Telephone Encounter (Signed)
Pt advised refill sent. °

## 2024-07-19 ENCOUNTER — Ambulatory Visit
Admission: RE | Admit: 2024-07-19 | Discharge: 2024-07-19 | Disposition: A | Source: Ambulatory Visit | Attending: Family Medicine | Admitting: Family Medicine

## 2024-07-19 DIAGNOSIS — Z1231 Encounter for screening mammogram for malignant neoplasm of breast: Secondary | ICD-10-CM

## 2024-07-27 ENCOUNTER — Other Ambulatory Visit (HOSPITAL_COMMUNITY): Payer: Self-pay

## 2024-07-27 ENCOUNTER — Other Ambulatory Visit: Payer: Self-pay | Admitting: Family Medicine

## 2024-07-27 ENCOUNTER — Other Ambulatory Visit (HOSPITAL_BASED_OUTPATIENT_CLINIC_OR_DEPARTMENT_OTHER): Payer: Self-pay

## 2024-07-27 MED ORDER — OZEMPIC (0.25 OR 0.5 MG/DOSE) 2 MG/3ML ~~LOC~~ SOPN
0.5000 mg | PEN_INJECTOR | SUBCUTANEOUS | 2 refills | Status: DC
  Filled 2024-07-27: qty 3, 28d supply, fill #0

## 2024-07-30 ENCOUNTER — Other Ambulatory Visit (HOSPITAL_BASED_OUTPATIENT_CLINIC_OR_DEPARTMENT_OTHER): Payer: Self-pay

## 2024-07-30 ENCOUNTER — Ambulatory Visit (INDEPENDENT_AMBULATORY_CARE_PROVIDER_SITE_OTHER): Admitting: Family Medicine

## 2024-07-30 ENCOUNTER — Encounter: Payer: Self-pay | Admitting: Family Medicine

## 2024-07-30 ENCOUNTER — Other Ambulatory Visit: Payer: Self-pay

## 2024-07-30 VITALS — BP 120/70 | HR 72 | Resp 12 | Wt 239.2 lb

## 2024-07-30 DIAGNOSIS — E7800 Pure hypercholesterolemia, unspecified: Secondary | ICD-10-CM

## 2024-07-30 DIAGNOSIS — Z7985 Long-term (current) use of injectable non-insulin antidiabetic drugs: Secondary | ICD-10-CM

## 2024-07-30 DIAGNOSIS — M170 Bilateral primary osteoarthritis of knee: Secondary | ICD-10-CM

## 2024-07-30 DIAGNOSIS — G8929 Other chronic pain: Secondary | ICD-10-CM | POA: Diagnosis not present

## 2024-07-30 DIAGNOSIS — Z7984 Long term (current) use of oral hypoglycemic drugs: Secondary | ICD-10-CM

## 2024-07-30 DIAGNOSIS — E119 Type 2 diabetes mellitus without complications: Secondary | ICD-10-CM

## 2024-07-30 DIAGNOSIS — M545 Low back pain, unspecified: Secondary | ICD-10-CM

## 2024-07-30 DIAGNOSIS — G894 Chronic pain syndrome: Secondary | ICD-10-CM

## 2024-07-30 LAB — POCT GLYCOSYLATED HEMOGLOBIN (HGB A1C)
HbA1c POC (<> result, manual entry): 7.2 %
HbA1c, POC (controlled diabetic range): 7.2 % — AB (ref 0.0–7.0)
HbA1c, POC (prediabetic range): 7.2 % — AB (ref 5.7–6.4)
Hemoglobin A1C: 7.2 % — AB (ref 4.0–5.6)

## 2024-07-30 MED ORDER — INVOKANA 300 MG PO TABS
300.0000 mg | ORAL_TABLET | Freq: Every day | ORAL | 3 refills | Status: AC
  Filled 2024-07-30: qty 90, 90d supply, fill #0

## 2024-07-30 MED ORDER — OZEMPIC (0.25 OR 0.5 MG/DOSE) 2 MG/3ML ~~LOC~~ SOPN
0.5000 mg | PEN_INJECTOR | SUBCUTANEOUS | 5 refills | Status: AC
  Filled 2024-07-30: qty 3, 28d supply, fill #0
  Filled 2024-08-23: qty 3, 28d supply, fill #1

## 2024-07-30 MED ORDER — MONTELUKAST SODIUM 10 MG PO TABS: 10.0000 mg | ORAL_TABLET | Freq: Every day | ORAL | 3 refills | Status: AC

## 2024-07-30 MED ORDER — HYDROCODONE-ACETAMINOPHEN 5-325 MG PO TABS: ORAL_TABLET | ORAL | 0 refills | Status: DC

## 2024-07-30 MED ORDER — PIOGLITAZONE HCL 45 MG PO TABS: 45.0000 mg | ORAL_TABLET | Freq: Every day | ORAL | 3 refills | Status: AC
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
## 2024-08-03 ENCOUNTER — Encounter: Payer: Self-pay | Admitting: Pharmacist

## 2024-08-03 NOTE — Progress Notes (Signed)
 Pharmacy Quality Measure Review  This patient is appearing on a report for being at risk of failing the adherence measure for hypertension (ACEi/ARB) medications this calendar year.   Medication: lisinopril  2.5mg  Last fill date: 04/07/2024 for 90 day supply  Lisinopril  was discontinued 04/30/2024 by PCP due to low blood pressure.   No additional follow up needed.   Madelin Ray, PharmD Clinical Pharmacist [facility]

## 2024-08-10 ENCOUNTER — Encounter: Payer: Self-pay | Admitting: Specialist

## 2024-08-13 ENCOUNTER — Other Ambulatory Visit: Payer: Self-pay | Admitting: Specialist

## 2024-08-13 DIAGNOSIS — M545 Low back pain, unspecified: Secondary | ICD-10-CM

## 2024-08-18 ENCOUNTER — Ambulatory Visit
Admission: RE | Admit: 2024-08-18 | Discharge: 2024-08-18 | Disposition: A | Discharge status: Home / Self Care | Source: Ambulatory Visit | Attending: Specialist | Admitting: Specialist

## 2024-08-18 DIAGNOSIS — M545 Low back pain, unspecified: Secondary | ICD-10-CM

## 2024-08-25 ENCOUNTER — Other Ambulatory Visit (HOSPITAL_BASED_OUTPATIENT_CLINIC_OR_DEPARTMENT_OTHER): Payer: Self-pay

## 2024-08-31 ENCOUNTER — Other Ambulatory Visit: Payer: Self-pay | Admitting: Specialist

## 2024-08-31 DIAGNOSIS — S32000A Wedge compression fracture of unspecified lumbar vertebra, initial encounter for closed fracture: Secondary | ICD-10-CM

## 2024-09-01 ENCOUNTER — Other Ambulatory Visit: Payer: Self-pay | Admitting: Family Medicine

## 2024-09-02 NOTE — Telephone Encounter (Signed)
 Requesting: hydrocodone  Contract: 04/30/24 UDS: 04/30/24 Last Visit: 07/30/24 Next Visit: 10/29/24 Last Refill: 07/30/24 (120,0)  Please Advise. Rx pending

## 2024-09-03 ENCOUNTER — Other Ambulatory Visit: Payer: Self-pay | Admitting: Interventional Radiology

## 2024-09-03 ENCOUNTER — Ambulatory Visit: Admission: RE | Admit: 2024-09-03 | Source: Ambulatory Visit

## 2024-09-03 ENCOUNTER — Inpatient Hospital Stay: Admission: RE | Admit: 2024-09-03 | Source: Ambulatory Visit

## 2024-09-03 DIAGNOSIS — S32000A Wedge compression fracture of unspecified lumbar vertebra, initial encounter for closed fracture: Secondary | ICD-10-CM
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
## 2024-09-07 ENCOUNTER — Other Ambulatory Visit: Payer: Self-pay | Admitting: Family Medicine

## 2024-09-13 ENCOUNTER — Encounter (HOSPITAL_COMMUNITY)

## 2024-09-15 ENCOUNTER — Other Ambulatory Visit: Payer: Self-pay | Admitting: Family Medicine

## 2024-09-16 ENCOUNTER — Encounter (HOSPITAL_COMMUNITY): Admission: RE | Admit: 2024-09-16

## 2024-09-16 DIAGNOSIS — S32000A Wedge compression fracture of unspecified lumbar vertebra, initial encounter for closed fracture: Secondary | ICD-10-CM

## 2024-09-16 MED ORDER — TECHNETIUM TC 99M MEDRONATE IV KIT
20.0000 | PACK | Freq: Once | INTRAVENOUS | Status: AC
  Administered 2024-09-16: 22 via INTRAVENOUS

## 2024-10-29 ENCOUNTER — Encounter: Admitting: Family Medicine
